# Patient Record
Sex: Female | Born: 1975 | Hispanic: No | Marital: Single | Smoking: Never smoker
Health system: Southern US, Community
[De-identification: ages and names within clinical notes are randomized; demographics above are authoritative.]

## PROBLEM LIST (undated history)

## (undated) ENCOUNTER — Emergency Department (HOSPITAL_COMMUNITY): Payer: Self-pay | Source: Home / Self Care

## (undated) DIAGNOSIS — R251 Tremor, unspecified: Secondary | ICD-10-CM

## (undated) DIAGNOSIS — F411 Generalized anxiety disorder: Secondary | ICD-10-CM

## (undated) HISTORY — PX: HIP SURGERY: SHX245

## (undated) HISTORY — PX: ABDOMINAL HYSTERECTOMY: SHX81

## (undated) HISTORY — PX: OTHER SURGICAL HISTORY: SHX169

---

## 2010-02-05 ENCOUNTER — Encounter: Admission: RE | Admit: 2010-02-05 | Discharge: 2010-02-27 | Payer: Self-pay | Admitting: Orthopedic Surgery

## 2018-11-17 ENCOUNTER — Ambulatory Visit (INDEPENDENT_AMBULATORY_CARE_PROVIDER_SITE_OTHER): Payer: No Typology Code available for payment source

## 2018-11-17 ENCOUNTER — Other Ambulatory Visit: Payer: Self-pay | Admitting: Podiatry

## 2018-11-17 ENCOUNTER — Ambulatory Visit (INDEPENDENT_AMBULATORY_CARE_PROVIDER_SITE_OTHER): Payer: No Typology Code available for payment source | Admitting: Podiatry

## 2018-11-17 ENCOUNTER — Encounter (INDEPENDENT_AMBULATORY_CARE_PROVIDER_SITE_OTHER): Payer: Self-pay

## 2018-11-17 ENCOUNTER — Encounter: Payer: Self-pay | Admitting: Podiatry

## 2018-11-17 ENCOUNTER — Other Ambulatory Visit: Payer: Self-pay

## 2018-11-17 VITALS — Temp 98.0°F

## 2018-11-17 DIAGNOSIS — M722 Plantar fascial fibromatosis: Secondary | ICD-10-CM

## 2018-11-17 DIAGNOSIS — Q666 Other congenital valgus deformities of feet: Secondary | ICD-10-CM | POA: Diagnosis not present

## 2018-11-17 DIAGNOSIS — M2141 Flat foot [pes planus] (acquired), right foot: Secondary | ICD-10-CM

## 2018-11-17 DIAGNOSIS — M2142 Flat foot [pes planus] (acquired), left foot: Secondary | ICD-10-CM

## 2018-11-17 DIAGNOSIS — M79672 Pain in left foot: Secondary | ICD-10-CM

## 2018-11-17 DIAGNOSIS — M79671 Pain in right foot: Secondary | ICD-10-CM

## 2018-11-17 NOTE — Progress Notes (Signed)
  Subjective:  Patient ID: Susan Guerra, female    DOB: 1976-05-14,  MRN: [id number]  Chief Complaint  Patient presents with  . Foot Pain    Patient presents today for bilat heel pain    43 y.o. female presents with the above complaint. Present for months, cnstant heel pain, burning, tingling, aching. Endorses flat feet.  Has tried inserts heel left pain meds without relief   Review of Systems: Negative except as noted in the HPI. Denies N/V/F/Ch.  No past medical history on file. No current outpatient medications on file.  Social History   Tobacco Use  Smoking Status Not on file    Allergies not on file Objective:  There were no vitals filed for this visit. There is no height or weight on file to calculate BMI. Constitutional Well developed. Well nourished.  Vascular Dorsalis pedis pulses palpable bilaterally. Posterior tibial pulses palpable bilaterally. Capillary refill normal to all digits.  No cyanosis or clubbing noted. Pedal hair growth normal.  Neurologic Normal speech. Oriented to person, place, and time. Epicritic sensation to light touch grossly present bilaterally.  Dermatologic Nails well groomed and normal in appearance. No open wounds. No skin lesions.  Orthopedic: Normal joint ROM without pain or crepitus bilaterally. No visible deformities. Tender to palpation at the calcaneal tuber bilaterally. No pain with calcaneal squeeze bilaterally. Ankle ROM diminished range of motion bilaterally. Silfverskiold Test: negative bilaterally. Severe pes planus hindfoot valgus bilateral   Radiographs: Taken and reviewed. No acute fractures or dislocations. No evidence of stress fracture.  Plantar heel spur absent. Posterior heel spur absent.  Severe pes planus bilateral decreased calcaneal inclination, increased calcaneal declination  Assessment:   1. Plantar fasciitis   2. Pes planus of both feet   3. Congenital hindfoot valgus   4. Heel pain, bilateral     Plan:  Patient was evaluated and treated and all questions answered.  Plantar Fasciitis, bilaterally - XR reviewed as above.  - Injection delivered to the plantar fascia as below. - DME: Rx for [facility] CMOs, night splint.  Procedure: Injection Tendon/Ligament Location: Bilateral plantar fascia at the glabrous junction; medial approach. Skin Prep: alcohol Injectate: 1 cc 0.5% marcaine plain, 1 cc dexamethasone phosphate, 0.5 cc kenalog 10. Disposition: Patient tolerated procedure well. Injection site dressed with a band-aid.  Return in about 3 weeks (around 12/08/2018) for Plantar fasciitis, Bilateral.

## 2018-11-17 NOTE — Addendum Note (Signed)
Addended by: Geraldine Contras D on: 11/17/2018 05:07 PM   Modules accepted: Orders

## 2018-12-13 ENCOUNTER — Ambulatory Visit: Payer: Non-veteran care | Admitting: Podiatry

## 2018-12-17 ENCOUNTER — Telehealth: Payer: Self-pay | Admitting: Podiatry

## 2018-12-21 NOTE — Telephone Encounter (Signed)
Pt left a message to reschedule appointment from 27 April.

## 2019-01-24 ENCOUNTER — Ambulatory Visit (INDEPENDENT_AMBULATORY_CARE_PROVIDER_SITE_OTHER): Payer: No Typology Code available for payment source | Admitting: Podiatry

## 2019-01-24 ENCOUNTER — Encounter: Payer: Self-pay | Admitting: Podiatry

## 2019-01-24 ENCOUNTER — Other Ambulatory Visit: Payer: Self-pay

## 2019-01-24 VITALS — Temp 97.2°F

## 2019-01-24 DIAGNOSIS — M722 Plantar fascial fibromatosis: Secondary | ICD-10-CM

## 2019-01-24 NOTE — Progress Notes (Signed)
She presents today for follow-up of her bilateral plantar fasciitis states that she is still hurting she may be about 40% improved.  She states that [facility] is doing is working on orthotics for her.  Objective: She has pain on palpation medial calcaneal tubercles.  Pulses remain palpable.  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  Plan: Injected the bilateral heels today 20 mg Kenalog 5 mg Marcaine for maximal tenderness.  Tolerated procedure well follow-up with her once her orthotics come in.

## 2019-06-30 ENCOUNTER — Ambulatory Visit (INDEPENDENT_AMBULATORY_CARE_PROVIDER_SITE_OTHER): Payer: Non-veteran care

## 2019-06-30 ENCOUNTER — Other Ambulatory Visit: Payer: Self-pay

## 2019-06-30 ENCOUNTER — Ambulatory Visit
Admission: EM | Admit: 2019-06-30 | Discharge: 2019-06-30 | Disposition: A | Discharge status: Home / Self Care | Payer: Non-veteran care | Attending: Family Medicine | Admitting: Family Medicine

## 2019-06-30 DIAGNOSIS — Z20828 Contact with and (suspected) exposure to other viral communicable diseases: Secondary | ICD-10-CM

## 2019-06-30 DIAGNOSIS — J069 Acute upper respiratory infection, unspecified: Secondary | ICD-10-CM

## 2019-06-30 DIAGNOSIS — Z7189 Other specified counseling: Secondary | ICD-10-CM

## 2019-06-30 DIAGNOSIS — R109 Unspecified abdominal pain: Secondary | ICD-10-CM

## 2019-06-30 DIAGNOSIS — Z20822 Contact with and (suspected) exposure to covid-19: Secondary | ICD-10-CM

## 2019-06-30 DIAGNOSIS — R05 Cough: Secondary | ICD-10-CM

## 2019-06-30 DIAGNOSIS — M545 Low back pain: Secondary | ICD-10-CM | POA: Diagnosis not present

## 2019-06-30 DIAGNOSIS — K5900 Constipation, unspecified: Secondary | ICD-10-CM

## 2019-06-30 HISTORY — DX: Tremor, unspecified: R25.1

## 2019-06-30 LAB — URINALYSIS, COMPLETE (UACMP) WITH MICROSCOPIC
Glucose, UA: NEGATIVE mg/dL
Hgb urine dipstick: NEGATIVE
Leukocytes,Ua: NEGATIVE
Nitrite: NEGATIVE
Specific Gravity, Urine: >1.03 — ABNORMAL HIGH (ref 1.005–1.030)
pH: 6.5 (ref 5.0–8.0)

## 2019-06-30 MED ORDER — HYDROCOD POLST-CPM POLST ER 10-8 MG/5ML PO SUER: 5.0000 mL | Freq: Two times a day (BID) | ORAL | 0 refills | Status: DC | PRN

## 2019-06-30 MED ORDER — POLYETHYLENE GLYCOL 3350 17 GM/SCOOP PO POWD: ORAL | 0 refills | Status: AC

## 2019-06-30 NOTE — ED Triage Notes (Signed)
Pt states she has a cough and chills x 3 weeks. Pt states she has pain in her stomach and back as well.

## 2019-06-30 NOTE — Discharge Instructions (Addendum)
It was very nice seeing you today in clinic. Thank you for entrusting me with your care.   Rest and Increase fluid intake as much as possible. Water is always best, as sugar and caffeine containing fluids can cause you to become dehydrated. Try to incorporate electrolyte enriched fluids, such as Gatorade or Pedialyte, into your daily fluid intake. I have sent in some cough medication for use as needed. Remember the cough medication will make you sleepy. May use Tylenol and/or Ibuprofen as needed for pain/fever.   Use Miralax as needed for constipation. Increasing fluid and activity will also help.  You were tested for SARS-CoV-2 (novel coronavirus) today. Testing is performed by an outside lab ([facility]) and has variable turn around times ranging between 2-5 days. Current recommendations from the the CDC and [place] DHHS require that you remain out of work in order to quarantine at home until negative test results are have been received. In the event that your test results are positive, you will be contacted with further directives. These measures are being implemented out of an abundance of caution to prevent transmission and spread during the current SARS-CoV-2 pandemic.  Make arrangements to follow up with your regular doctor in 1 week for re-evaluation if not improving.  If your symptoms/condition worsens, please seek follow up care either here or in the ER. Please remember, our [place] providers are "right here with you" when you need [place].   Again, it was my pleasure to take care of you today. Thank you for choosing our clinic. I hope that you start to feel better quickly.   Honor Loh, MSN, APRN, FNP-C, CEN Advanced Practice Provider [facility]

## 2019-06-30 NOTE — ED Provider Notes (Signed)
[place], [place]   Name: Susan Guerra DOB: Dec 09, 1975 MRN: [id number] CSN: [id number] PCP: Merry Proud, PA-C  Arrival date and time:  06/30/19 5621  Chief Complaint:  Cough and Abdominal Pain   NOTE: Prior to seeing the patient today, I have reviewed the triage nursing documentation and vital signs. Clinical staff has updated patient's PMH/PSHx, current medication list, and drug allergies/intolerances to ensure comprehensive history available to assist in medical decision making.   History:   HPI: Susan Guerra is a 43 y.o. female who presents today with complaints of cough for the last 3 weeks. She notes that cough has been dry. She denies any associated fevers. Over the course of the last week or so, patient has developed chills and body aches. Patient has had diffuse abdominal and lower back for the past few days. She denies that she has experienced any nausea, vomiting, or diarrhea. She is unable to advise when her LNBM was. She denies experiencing any urinary symptoms; no dysuria; frequency, or urgency. She is eating and drinking well. Patient denies any perceived alterations to her sense of taste or smell. 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 She has been tested the the virus in the past, however she is unable to advise when she was last tested.   Past Medical History:  Diagnosis Date  . Tremors of nervous system     Past Surgical History:  Procedure Laterality Date  . ABDOMINAL HYSTERECTOMY    . HIP SURGERY    . shoulder  suregery      Family History  Problem Relation Age of Onset  . Diabetes Mother   . Heart failure Mother   . Hypertension Mother   . Cancer Father   . Heart failure Father   . Alzheimer's disease Father     Social History   Tobacco Use  . Smoking status: Never Smoker  . Smokeless tobacco: Never Used  Substance Use Topics  . Alcohol use: Not Currently   . Drug use: Never    There are no active problems to display for this patient.   Home Medications:    No outpatient medications have been marked as taking for the 06/30/19 encounter [facility] Encounter).    Allergies:   Sulfa antibiotics and Latex  Review of Systems (ROS): Review of Systems  Constitutional: Positive for fatigue. Negative for fever.  HENT: Negative for congestion, ear pain, postnasal drip, rhinorrhea, sinus pressure, sinus pain, sneezing and sore throat.   Eyes: Negative for pain, discharge and redness.  Respiratory: Positive for cough. Negative for chest tightness and shortness of breath.   Cardiovascular: Positive for chest pain (pleuritic). Negative for palpitations.  Gastrointestinal: Positive for abdominal pain and constipation. Negative for diarrhea, nausea and vomiting.  Musculoskeletal: Positive for back pain and myalgias. Negative for arthralgias and neck pain.  Skin: Negative for color change, pallor and rash.  Neurological: Negative for dizziness, syncope, weakness and headaches.  Hematological: Negative for adenopathy.     Vital Signs: Today's Vitals   06/30/19 0920 06/30/19 0933 06/30/19 1048 06/30/19 1049  BP:  121/83    Pulse:  79    Resp:  18    Temp:  98.3 F (36.8 C)    TempSrc:  Oral    SpO2:  100%    Weight: 145 lb (65.8 kg)     PainSc: 7   7  7      Physical Exam:  Physical Exam  Constitutional: She is oriented to person, place, and time and well-developed, well-nourished, and in no distress.  Acutely ill appearing; fatigued/listless.  HENT:  Head: Normocephalic and atraumatic.  Right Ear: Tympanic membrane normal.  Left Ear: Tympanic membrane normal.  Nose: Nose normal.  Mouth/Throat: Uvula is midline, oropharynx is clear and moist and mucous membranes are normal.  Eyes: Pupils are equal, round, and reactive to light. Conjunctivae and EOM are normal.  Neck: Normal range of motion. Neck supple.  Cardiovascular: Normal rate,  regular rhythm, normal heart sounds and intact distal pulses. Exam reveals no gallop and no friction rub.  No murmur heard. Pulmonary/Chest: Effort normal. No respiratory distress. She has no decreased breath sounds. She has no wheezes. She has rhonchi (scattered; clears with cough). She has no rales.  Deep dry cough in clinic; significant.   Abdominal: Soft. Normal appearance and bowel sounds are normal. She exhibits no distension. There is no hepatosplenomegaly. There is generalized abdominal tenderness. There is no CVA tenderness.  Musculoskeletal: Normal range of motion.  Neurological: She is alert and oriented to person, place, and time. Gait normal.  Skin: Skin is warm and dry. No rash noted.  Psychiatric: Mood, memory, affect and judgment normal.  Nursing note and vitals reviewed.   Urgent Care Treatments / Results:   LABS: PLEASE NOTE: all labs that were ordered this encounter are listed, however only abnormal results are displayed. Labs Reviewed  URINALYSIS, COMPLETE (UACMP) WITH MICROSCOPIC - Abnormal; Notable for the following components:      Result Value   Color, Urine AMBER (*)    APPearance HAZY (*)    Specific Gravity, Urine >1.030 (*)    Bilirubin Urine SMALL (*)    Ketones, ur TRACE (*)    Protein, ur TRACE (*)    Bacteria, UA FEW (*)    All other components within normal limits  NOVEL CORONAVIRUS, NAA (HOSP ORDER, SEND-OUT TO REF LAB; TAT 18-24 HRS)    EKG: -None  RADIOLOGY: Dg Chest 2 View  Result Date: 06/30/2019 CLINICAL DATA:  Cough. EXAM: CHEST - 2 VIEW COMPARISON:  None. FINDINGS: The heart size and mediastinal contours are within normal limits. Both lungs are clear. No pneumothorax or pleural effusion is noted. The visualized skeletal structures are unremarkable. IMPRESSION: No active cardiopulmonary disease. Electronically Signed   By: Lupita RaiderJames  Green Jr M.D.   On: 06/30/2019 10:18   Dg Abdomen 1 View  Result Date: 06/30/2019 CLINICAL DATA:   Generalized abdominal pain. EXAM: ABDOMEN - 1 VIEW COMPARISON:  None. FINDINGS: No abnormal bowel dilatation is noted. Large amount of stool seen throughout the colon. No nephrolithiasis is noted. Phleboliths are noted in the pelvis. Intrauterine device is noted. IMPRESSION: No abnormal bowel dilatation is noted.  Large stool burden is noted. Electronically Signed   By: Lupita RaiderJames  Green Jr M.D.   On: 06/30/2019 10:19    PROCEDURES: Procedures  MEDICATIONS RECEIVED THIS VISIT: Medications - No data to display  PERTINENT CLINICAL COURSE NOTES/UPDATES:   Initial Impression / Assessment and Plan / Urgent Care Course:  Pertinent labs & imaging results that were available during my care of the patient were personally reviewed by me and considered in my medical decision making (see lab/imaging section of note for values and interpretations).  Susan Guerra is a 43 y.o. female who presents to [facility] today with complaints of Cough and Abdominal Pain   Patient overall well appearing and in no acute distress today in clinic. Presenting  symptoms (see HPI) and exam as documented above. She presents with symptoms associated with SARS-CoV-2 (novel coronavirus). Discussed typical symptom constellation. Reviewed potential for infection and need for testing. Patient amenable to being tested. SARS-CoV-2 swab collected by certified clinical staff. Discussed variable turn around times associated with testing, as swabs are being processed at [facility], and have been taking between 2-5 days to come back. She was advised to self quarantine, per [facility] DHHS guidelines, until negative results received.   UA negative for infection. Radiographs of the chest revealed no acute cardiopulmonary process; no evidence of peribronchial thickening, areas of consolidation, or focal infiltrates. Symptoms consistent with acute viral illness with cough. Until ruled out with confirmatory lab testing, SARS-CoV-2 remains part of the  differential. Her testing is pending at this time. I discussed with her that her symptoms are felt to be viral in nature, thus antibiotics would not offer her any relief or improve his symptoms any faster than conservative symptomatic management. Discussed supportive care measures at home during acute phase of illness. Cough is significant and preventing her from sleeping. Patient has used OTC ant-tussives without relief. She is having pleuritic chest pain secondary to her cough. Will send in a short term supply of Tussionex for PRN use. She was encouraged to ensure adequate hydration (water and ORS) to prevent dehydration and electrolyte derangements. Patient may use APAP and/or IBU on an as needed basis for pain/fever.   Regarding her abdominal pain. Diagnostic radiographs of the abdomen revealed large colonic stool burden consistent with constipation. Again, she was encouraged to increase fluid intake, especially while using the Tussionex. She was encouraged to move around as much as possible to help stimulate bowel action. Discussed increasing dietary fiber intake to help promote regular bowel movements.   Current clinical condition warrants patient being out of work in order to quarantine while waiting for testing results. She was provided with the appropriate documentation to provide to her place of employment that will allow for her to RTW on 07/03/2019 with no restrictions. RTW is contingent on her SARS-CoV-2 test results being reviewed as negative.   Discussed follow up with primary care physician in 1 week for re-evaluation. I have reviewed the follow up and strict return precautions for any new or worsening symptoms. Patient is aware of symptoms that would be deemed urgent/emergent, and would thus require further evaluation either here or in the emergency department. At the time of discharge, she verbalized understanding and consent with the discharge plan as it was reviewed with her. All questions  were fielded by provider and/or clinic staff prior to patient discharge.    Final Clinical Impressions / Urgent Care Diagnoses:   Final diagnoses:  Viral URI with cough  Constipation, unspecified constipation type  Encounter for laboratory testing for COVID-19 virus  Advice given about COVID-19 virus infection    New Prescriptions:  [place] Controlled Substance Registry consulted? Yes, I have consulted the [place] Controlled Substances Registry for this patient, and feel the risk/benefit ratio today is favorable for proceeding with this prescription for a controlled substance.  . Discussed use of controlled substance medication to treat her acute symptoms.  o Reviewed [place] STOP Act regulations  o Clinic does not refill controlled substances over the phone without face to face evaluation.  . Safety precautions reviewed.  o Medications should not be sold or taken with alcohol.  o Avoid use while working, driving, or operating heavy machinery.  o Side effects associated with the use of this particular  medication reviewed. - Patient understands that this medication can cause CNS depression, increase her risk of falls, and even lead to overdose that may result in death, if used outside of the parameters that she and I discussed.  With all of this in mind, she knowingly accepts the risks and responsibilities associated with intended course of treatment, and elects to responsibly proceed as discussed.  Meds ordered this encounter  Medications  . polyethylene glycol powder (MIRALAX) 17 GM/SCOOP powder    Sig: 1 scoop (17 grams) daily as needed for constipation.    Dispense:  116 g    Refill:  0  . chlorpheniramine-HYDROcodone (TUSSIONEX PENNKINETIC ER) 10-8 MG/5ML SUER    Sig: Take 5 mLs by mouth every 12 (twelve) hours as needed for cough.    Dispense:  70 mL    Refill:  0    Recommended Follow up Care:  Patient encouraged to follow up with the following provider within the specified time frame, or  sooner as dictated by the severity of her symptoms. As always, she was instructed that for any urgent/emergent care needs, she should seek care either here or in the emergency department for more immediate evaluation.  Follow-up Information    Charolett Bumpers, PA-C In 1 week.   Specialty: Physician Assistant Why: General reassessment of symptoms if not improving Contact information: [address] [place] [address] [phone]         NOTE: This note was prepared using Dragon dictation software along with smaller phrase technology. Despite my best ability to proofread, there is the potential that transcriptional errors may still occur from this process, and are completely unintentional.    Verlee Monte, NP 07/01/19 2048

## 2019-07-02 LAB — NOVEL CORONAVIRUS, NAA (HOSP ORDER, SEND-OUT TO REF LAB; TAT 18-24 HRS): SARS-CoV-2, NAA: NOT DETECTED

## 2020-03-03 ENCOUNTER — Emergency Department: Payer: No Typology Code available for payment source

## 2020-03-03 ENCOUNTER — Observation Stay
Admission: EM | Admit: 2020-03-03 | Discharge: 2020-03-04 | Disposition: A | Discharge status: Home / Self Care | Payer: No Typology Code available for payment source | Attending: Internal Medicine | Admitting: Internal Medicine

## 2020-03-03 ENCOUNTER — Other Ambulatory Visit: Payer: Self-pay

## 2020-03-03 DIAGNOSIS — G43009 Migraine without aura, not intractable, without status migrainosus: Secondary | ICD-10-CM | POA: Diagnosis present

## 2020-03-03 DIAGNOSIS — G43909 Migraine, unspecified, not intractable, without status migrainosus: Secondary | ICD-10-CM | POA: Diagnosis not present

## 2020-03-03 DIAGNOSIS — R531 Weakness: Secondary | ICD-10-CM | POA: Diagnosis not present

## 2020-03-03 DIAGNOSIS — Z20822 Contact with and (suspected) exposure to covid-19: Secondary | ICD-10-CM | POA: Insufficient documentation

## 2020-03-03 DIAGNOSIS — J45909 Unspecified asthma, uncomplicated: Secondary | ICD-10-CM | POA: Diagnosis not present

## 2020-03-03 DIAGNOSIS — F419 Anxiety disorder, unspecified: Secondary | ICD-10-CM | POA: Diagnosis not present

## 2020-03-03 DIAGNOSIS — R2990 Unspecified symptoms and signs involving the nervous system: Secondary | ICD-10-CM

## 2020-03-03 DIAGNOSIS — F329 Major depressive disorder, single episode, unspecified: Secondary | ICD-10-CM | POA: Diagnosis not present

## 2020-03-03 DIAGNOSIS — R569 Unspecified convulsions: Secondary | ICD-10-CM

## 2020-03-03 DIAGNOSIS — R410 Disorientation, unspecified: Secondary | ICD-10-CM

## 2020-03-03 LAB — COMPREHENSIVE METABOLIC PANEL
ALT: 17 U/L (ref 0–44)
AST: 17 U/L (ref 15–41)
Albumin: 3.8 g/dL (ref 3.5–5.0)
Alkaline Phosphatase: 31 U/L — ABNORMAL LOW (ref 38–126)
Anion gap: 9 (ref 5–15)
BUN: 9 mg/dL (ref 6–20)
CO2: 25 mmol/L (ref 22–32)
Calcium: 8.3 mg/dL — ABNORMAL LOW (ref 8.9–10.3)
Chloride: 107 mmol/L (ref 98–111)
Creatinine, Ser: 0.84 mg/dL (ref 0.44–1.00)
GFR calc Af Amer: >60 mL/min (ref >60)
GFR calc non Af Amer: >60 mL/min (ref >60)
Glucose, Bld: 100 mg/dL — ABNORMAL HIGH (ref 70–99)
Potassium: 3.1 mmol/L — ABNORMAL LOW (ref 3.5–5.1)
Sodium: 141 mmol/L (ref 135–145)
Total Bilirubin: 0.4 mg/dL (ref 0.3–1.2)
Total Protein: 6.7 g/dL (ref 6.5–8.1)

## 2020-03-03 LAB — CBC WITH DIFFERENTIAL/PLATELET
Abs Immature Granulocytes: 0.01 10*3/uL (ref 0.00–0.07)
Basophils Absolute: 0.1 10*3/uL (ref 0.0–0.1)
Basophils Relative: 1 %
Eosinophils Absolute: 0.1 10*3/uL (ref 0.0–0.5)
Eosinophils Relative: 1 %
HCT: 33.7 % — ABNORMAL LOW (ref 36.0–46.0)
Hemoglobin: 10.7 g/dL — ABNORMAL LOW (ref 12.0–15.0)
Immature Granulocytes: 0 %
Lymphocytes Relative: 31 %
Lymphs Abs: 1.8 10*3/uL (ref 0.7–4.0)
MCH: 27.1 pg (ref 26.0–34.0)
MCHC: 31.8 g/dL (ref 30.0–36.0)
MCV: 85.3 fL (ref 80.0–100.0)
Monocytes Absolute: 0.5 10*3/uL (ref 0.1–1.0)
Monocytes Relative: 9 %
Neutro Abs: 3.5 10*3/uL (ref 1.7–7.7)
Neutrophils Relative %: 58 %
Platelets: 321 10*3/uL (ref 150–400)
RBC: 3.95 MIL/uL (ref 3.87–5.11)
RDW: 14.8 % (ref 11.5–15.5)
WBC: 6 10*3/uL (ref 4.0–10.5)
nRBC: 0 % (ref 0.0–0.2)

## 2020-03-03 LAB — PREGNANCY, URINE: Preg Test, Ur: NEGATIVE

## 2020-03-03 LAB — PROTIME-INR
INR: 1 (ref 0.8–1.2)
Prothrombin Time: 12.6 seconds (ref 11.4–15.2)

## 2020-03-03 MED ORDER — IOHEXOL 350 MG/ML SOLN
75.0000 mL | Freq: Once | INTRAVENOUS | Status: AC | PRN
  Administered 2020-03-03: 75 mL via INTRAVENOUS
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
## 2020-03-03 NOTE — ED Notes (Signed)
Pt with left arm weakness, cannot squeeze hand, can only hold left arm up for a short time. Left leg slightly weaker than right leg, can only hold left leg up for short amount of time.
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
## 2020-03-03 NOTE — ED Notes (Signed)
Seizure pads placed x2, bed in low and locked position, siderails up.

## 2020-03-03 NOTE — ED Notes (Signed)
Neurologist speaking with pt via teleconsult.

## 2020-03-03 NOTE — ED Notes (Signed)
Patient transported to CT 

## 2020-03-03 NOTE — ED Notes (Signed)
Pt helped on bedpan, pt able to help this RN pull down pants with both hands equally and put out left arm for blood draw with no assistance. Pt gives slight squeeze when asked to squeeze with left hand

## 2020-03-03 NOTE — ED Notes (Signed)
Neuro Stat consult put in. Cart 1

## 2020-03-04 ENCOUNTER — Observation Stay (HOSPITAL_BASED_OUTPATIENT_CLINIC_OR_DEPARTMENT_OTHER)
Admit: 2020-03-04 | Discharge: 2020-03-04 | Disposition: A | Discharge status: Home / Self Care | Payer: No Typology Code available for payment source | Attending: Family Medicine | Admitting: Family Medicine

## 2020-03-04 ENCOUNTER — Observation Stay: Payer: No Typology Code available for payment source

## 2020-03-04 ENCOUNTER — Other Ambulatory Visit: Payer: Self-pay

## 2020-03-04 ENCOUNTER — Encounter: Payer: Self-pay | Admitting: Family Medicine

## 2020-03-04 DIAGNOSIS — G43009 Migraine without aura, not intractable, without status migrainosus: Secondary | ICD-10-CM | POA: Diagnosis present

## 2020-03-04 DIAGNOSIS — R569 Unspecified convulsions: Secondary | ICD-10-CM

## 2020-03-04 DIAGNOSIS — I361 Nonrheumatic tricuspid (valve) insufficiency: Secondary | ICD-10-CM | POA: Diagnosis not present

## 2020-03-04 DIAGNOSIS — R410 Disorientation, unspecified: Secondary | ICD-10-CM | POA: Diagnosis not present

## 2020-03-04 DIAGNOSIS — F419 Anxiety disorder, unspecified: Secondary | ICD-10-CM

## 2020-03-04 DIAGNOSIS — R2990 Unspecified symptoms and signs involving the nervous system: Secondary | ICD-10-CM

## 2020-03-04 DIAGNOSIS — R531 Weakness: Secondary | ICD-10-CM | POA: Diagnosis not present

## 2020-03-04 LAB — URINE DRUG SCREEN, QUALITATIVE (ARMC ONLY)
Amphetamines, Ur Screen: NOT DETECTED
Barbiturates, Ur Screen: NOT DETECTED
Benzodiazepine, Ur Scrn: POSITIVE — AB
Cannabinoid 50 Ng, Ur ~~LOC~~: NOT DETECTED
Cocaine Metabolite,Ur ~~LOC~~: NOT DETECTED
MDMA (Ecstasy)Ur Screen: NOT DETECTED
Methadone Scn, Ur: NOT DETECTED
Opiate, Ur Screen: NOT DETECTED
Phencyclidine (PCP) Ur S: NOT DETECTED
Tricyclic, Ur Screen: NOT DETECTED

## 2020-03-04 LAB — ECHOCARDIOGRAM COMPLETE
AR max vel: 2.28 cm2
AV Area VTI: 2.26 cm2
AV Area mean vel: 2.49 cm2
AV Mean grad: 3.5 mmHg
AV Peak grad: 5.9 mmHg
Ao pk vel: 1.22 m/s
Area-P 1/2: 2.66 cm2
Height: 65 in
S' Lateral: 2.72 cm
Weight: 2240 oz

## 2020-03-04 LAB — HEMOGLOBIN A1C
Hgb A1c MFr Bld: 5.2 % (ref 4.8–5.6)
Mean Plasma Glucose: 102.54 mg/dL

## 2020-03-04 LAB — HIV ANTIBODY (ROUTINE TESTING W REFLEX): HIV Screen 4th Generation wRfx: NONREACTIVE

## 2020-03-04 LAB — AMMONIA: Ammonia: 34 umol/L (ref 9–35)

## 2020-03-04 LAB — LIPID PANEL
Cholesterol: 226 mg/dL — ABNORMAL HIGH (ref 0–200)
HDL: 52 mg/dL (ref >40)
LDL Cholesterol: 169 mg/dL — ABNORMAL HIGH (ref 0–99)
Total CHOL/HDL Ratio: 4.3 RATIO
Triglycerides: 26 mg/dL (ref <150)
VLDL: 5 mg/dL (ref 0–40)

## 2020-03-04 LAB — SARS CORONAVIRUS 2 BY RT PCR (HOSPITAL ORDER, PERFORMED IN ~~LOC~~ HOSPITAL LAB): SARS Coronavirus 2: NEGATIVE

## 2020-03-04 LAB — ETHANOL: Alcohol, Ethyl (B): <10 mg/dL (ref <10)

## 2020-03-04 LAB — TSH: TSH: 1.332 u[IU]/mL (ref 0.350–4.500)

## 2020-03-04 LAB — VITAMIN B12: Vitamin B-12: 328 pg/mL (ref 180–914)

## 2020-03-04 MED ORDER — FERROUS SULFATE 325 (65 FE) MG PO TABS
325.0000 mg | ORAL_TABLET | Freq: Every day | ORAL | Status: DC
  Administered 2020-03-04: 325 mg via ORAL
  Filled 2020-03-04 (×2): qty 1

## 2020-03-04 MED ORDER — SODIUM CHLORIDE 0.9 % IV SOLN: INTRAVENOUS | Status: DC

## 2020-03-04 MED ORDER — STROKE: EARLY STAGES OF RECOVERY BOOK: Freq: Once | Status: DC

## 2020-03-04 MED ORDER — ASPIRIN EC 325 MG PO TBEC: 325.0000 mg | DELAYED_RELEASE_TABLET | Freq: Every day | ORAL | Status: DC

## 2020-03-04 MED ORDER — ENOXAPARIN SODIUM 40 MG/0.4ML ~~LOC~~ SOLN: 40.0000 mg | SUBCUTANEOUS | Status: DC

## 2020-03-04 MED ORDER — ASPIRIN 300 MG RE SUPP: 300.0000 mg | Freq: Every day | RECTAL | Status: DC

## 2020-03-04 MED ORDER — DULOXETINE HCL 30 MG PO CPEP
90.0000 mg | ORAL_CAPSULE | Freq: Every day | ORAL | Status: DC
  Filled 2020-03-04: qty 1

## 2020-03-04 MED ORDER — PNEUMOCOCCAL VAC POLYVALENT 25 MCG/0.5ML IJ INJ: 0.5000 mL | INJECTION | INTRAMUSCULAR | Status: DC

## 2020-03-04 MED ORDER — ACETAMINOPHEN 650 MG RE SUPP: 650.0000 mg | RECTAL | Status: DC | PRN

## 2020-03-04 MED ORDER — ACETAMINOPHEN 325 MG PO TABS: 650.0000 mg | ORAL_TABLET | ORAL | Status: DC | PRN

## 2020-03-04 MED ORDER — SENNOSIDES-DOCUSATE SODIUM 8.6-50 MG PO TABS: 1.0000 | ORAL_TABLET | Freq: Every evening | ORAL | Status: DC | PRN

## 2020-03-04 MED ORDER — ACETAMINOPHEN 160 MG/5ML PO SOLN
650.0000 mg | ORAL | Status: DC | PRN
  Filled 2020-03-04: qty 20.3

## 2020-03-04 MED ORDER — DROPERIDOL 2.5 MG/ML IJ SOLN
2.5000 mg | Freq: Once | INTRAMUSCULAR | Status: AC
  Administered 2020-03-04: 2.5 mg via INTRAVENOUS
  Filled 2020-03-04: qty 2

## 2020-03-04 MED ORDER — SODIUM CHLORIDE 0.9 % IV BOLUS
1000.0000 mL | Freq: Once | INTRAVENOUS | Status: AC
  Administered 2020-03-04: 1000 mL via INTRAVENOUS

## 2020-03-04 MED ORDER — DIPHENHYDRAMINE HCL 50 MG/ML IJ SOLN
25.0000 mg | INTRAMUSCULAR | Status: AC
  Administered 2020-03-04: 25 mg via INTRAVENOUS
  Filled 2020-03-04: qty 1

## 2020-03-04 MED ORDER — LORAZEPAM 2 MG/ML IJ SOLN: 1.0000 mg | INTRAMUSCULAR | Status: DC | PRN

## 2020-03-04 MED ORDER — DEXAMETHASONE SODIUM PHOSPHATE 10 MG/ML IJ SOLN
10.0000 mg | Freq: Once | INTRAMUSCULAR | Status: AC
  Administered 2020-03-04: 10 mg via INTRAVENOUS
  Filled 2020-03-04: qty 1

## 2020-03-04 MED ORDER — CLOPIDOGREL BISULFATE 75 MG PO TABS
75.0000 mg | ORAL_TABLET | Freq: Every day | ORAL | Status: DC
  Administered 2020-03-04: 75 mg via ORAL
  Filled 2020-03-04: qty 1

## 2020-03-04 MED ORDER — KETOROLAC TROMETHAMINE 30 MG/ML IJ SOLN
15.0000 mg | Freq: Once | INTRAMUSCULAR | Status: AC
  Administered 2020-03-04: 15 mg via INTRAVENOUS
  Filled 2020-03-04: qty 1

## 2020-03-04 MED ORDER — LEVETIRACETAM IN NACL 1000 MG/100ML IV SOLN: 1000.0000 mg | Freq: Once | INTRAVENOUS | Status: DC

## 2020-03-04 NOTE — ED Notes (Signed)
Patient's home medications sent home with her brother Ramon Dredge. Six bottles and a bag of clothing were sent home with Ramon Dredge.

## 2020-03-04 NOTE — ED Notes (Signed)
MD notified that pt passed stroke swallow screen so MD can order appropriate diet.

## 2020-03-04 NOTE — Progress Notes (Signed)
Susan Guerra to be D/C'd Home per MD order.  Discussed prescriptions and follow up appointments with the patient. No prescriptions given to patient, medication list explained in detail. Pt and sister in law verbalized understanding.  Allergies as of 03/04/2020      Reactions   Aspirin Shortness Of Breath   Sulfa Antibiotics Other (See Comments)   Other reaction(s): RASH Other reaction(s): Other (See Comments) G6PD Deficiency G6PD Deficiency   Latex Itching      Medication List    TAKE these medications   Ajovy 225 MG/1.5ML Sosy Generic drug: Fremanezumab-vfrm Inject 1.5 mLs into the skin every 30 (thirty) days.   Armodafinil 50 MG tablet Take  1 tablet in QAM for  2 days; if tolerated , take  2 tablets in AM.   Asmanex (60 Metered Doses) 220 MCG/INH inhaler Generic drug: mometasone Inhale 2 puffs into the lungs at bedtime.   cetirizine 10 MG tablet Commonly known as: ZYRTEC Take 10 mg by mouth at bedtime as needed.   chlorpheniramine-HYDROcodone 10-8 MG/5ML Suer Commonly known as: Tussionex Pennkinetic ER Take 5 mLs by mouth every 12 (twelve) hours as needed for cough.   clonazePAM 2 MG tablet Commonly known as: KLONOPIN Take 2 mg by mouth at bedtime as needed.   diclofenac 75 MG EC tablet Commonly known as: VOLTAREN Take 75 mg by mouth 2 (two) times daily.   divalproex 500 MG 24 hr tablet Commonly known as: DEPAKOTE ER   DULoxetine 30 MG capsule Commonly known as: CYMBALTA Take 90 mg by mouth daily.   ferrous sulfate 325 (65 FE) MG EC tablet Take 325 mg by mouth daily.   levonorgestrel 20 MCG/24HR IUD Commonly known as: MIRENA by Intrauterine route.   modafinil 200 MG tablet Commonly known as: PROVIGIL Take 200 mg by mouth daily.   multivitamin with minerals Tabs tablet Take 1 tablet by mouth daily.   polyethylene glycol powder 17 GM/SCOOP powder Commonly known as: MiraLax 1 scoop (17 grams) daily as needed for constipation.   pregabalin 100 MG  capsule Commonly known as: LYRICA   Proventil HFA 108 (90 Base) MCG/ACT inhaler Generic drug: albuterol every 4 (four) hours as needed.   SUMAtriptan 100 MG tablet Commonly known as: IMITREX Take 100 mg by mouth every 2 (two) hours as needed.   tiZANidine 2 MG tablet Commonly known as: ZANAFLEX Take 2 mg by mouth at bedtime.   traZODone 100 MG tablet Commonly known as: DESYREL Take 100 mg by mouth at bedtime.   VITAMIN D3 PO Take 2 tablets by mouth daily.   zolmitriptan 5 MG tablet Commonly known as: ZOMIG Take 5 mg by mouth every 2 (two) hours as needed.   zonisamide 100 MG capsule Commonly known as: ZONEGRAN Take by mouth.       Vitals:   03/04/20 1118 03/04/20 1439  BP: 115/81 125/73  Pulse: 79 74  Resp:    Temp:    SpO2:      Skin clean, dry and intact without evidence of skin break down, no evidence of skin tears noted. IV catheter discontinued intact. Site without signs and symptoms of complications. Dressing and pressure applied. Pt denies pain at this time. No complaints noted.  An After Visit Summary was printed and given to the patient. Patient escorted via WC, and D/C home via private auto.  Susan Guerra Susan Guerra
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
## 2020-03-04 NOTE — Evaluation (Signed)
Occupational Therapy Evaluation Patient Details Name: Susan Guerra MRN: [id number] DOB: December 13, 1975 Today's Date: 03/04/2020    History of Present Illness Pt is a 44 y.o. Caucasian female with a known history of migraine PE, obstructive sleep apnea and asthma. Per MD impression, pt is presenting with possible new onset seizures w/ associated acute encephalopathy, confusion, and L weakness/numbness possibly related to atypical migrane TIA/stroke. Per MRI report pt has no acute intracranial abnormality.   Clinical Impression   Susan Guerra was seen for OT evaluation this date. Prior to hospital admission, pt was Independent c mobility and I/ADLs (husband supervises shower 2/2 vertigo) including working full time and pt has 24/7 care at home. Pt presents to acute OT demonstrating baseline independence to perform ADL and mobility tasks and no strength, sensory, coordination, or cognitive deficits appreciated with assessment. Pt denies concerns c ADLs returning home however expresses anxiety around not knowing where her husband is and not remembering events leading to hospital admission - OT educated pt on how to call family using room phone and notified unit secretary of pt's desire to change designated visitor. No skilled OT needs identified. Will sign off. Please re-consult if additional OT needs arise. Pt would benefit from 3-in-1 commode for improved functional independence c bathing.     Follow Up Recommendations  No OT follow up    Equipment Recommendations  3 in 1 bedside commode    Recommendations for Other Services       Precautions / Restrictions Precautions Precautions: Fall;Other (comment) Precaution Comments: seizure precautions Restrictions Weight Bearing Restrictions: No      Mobility Bed Mobility Overal bed mobility: Independent             General bed mobility comments:   Transfers Overall transfer level: Independent   Transfers: Sit to/from Stand           General transfer comment: Not tested, per PT pt is independent    Balance Overall balance assessment: Independent                                         ADL either performed or assessed with clinical judgement   ADL Overall ADL's : Modified independent                                       General ADL Comments: Dons briefs at bed level bridging Independently. Demosntrates appropriatae FMC and BUE AROM for ADLs     Vision         Perception     Praxis      Pertinent Vitals/Pain Pain Assessment: No/denies pain     Hand Dominance     Extremity/Trunk Assessment Upper Extremity Assessment Upper Extremity Assessment: Overall WFL for tasks assessed   Lower Extremity Assessment Lower Extremity Assessment: Overall WFL for tasks assessed       Communication Communication Communication: No difficulties   Cognition Arousal/Alertness: Awake/alert Behavior During Therapy: WFL for tasks assessed/performed Overall Cognitive Status: Within Functional Limits for tasks assessed                                 General Comments: Pt expressed anxiety r/t not knowing where her husband is or knowing the circumstances that led to admission  General Comments       Exercises Exercises: Other exercises Other Exercises Other Exercises: Pt educated re: OT role, DME recs, d/c recs, home/routines modifications Other Exercises: LBD, simulated UBD, therapeutic listening    Shoulder Instructions      Home Living Family/patient expects to be discharged to:: Private residence Living Arrangements: Spouse/significant other;Children;Other relatives Available Help at Discharge: Family;Available 24 hours/day Type of Home: House Home Access: Stairs to enter [employer] of Steps: 3 Entrance Stairs-Rails: Left Home Layout: Two level;Able to live on main level with bedroom/bathroom     Bathroom Shower/Tub: Walk-in shower;Tub/shower  unit   Bathroom Toilet: Handicapped height Bathroom Accessibility: Yes   Home Equipment: Crutches          Prior Functioning/Environment Level of Independence: Independent with assistive device(s)    ADL's / Homemaking Assistance Needed: Husband supervises standing bathing secondary to pt reported vestibular vertigo            OT Problem List: Impaired sensation      OT Treatment/Interventions:      OT Goals(Current goals can be found in the care plan section) Acute Rehab OT Goals Patient Stated Goal: To return home OT Goal Formulation: With patient Time For Goal Achievement: 03/18/20 Potential to Achieve Goals: Good  OT Frequency:     Barriers to D/C:            Co-evaluation              AM-PAC OT "6 Clicks" Daily Activity     Outcome Measure Help from another person eating meals?: None Help from another person taking care of personal grooming?: None Help from another person toileting, which includes using toliet, bedpan, or urinal?: A Little Help from another person bathing (including washing, rinsing, drying)?: A Little Help from another person to put on and taking off regular upper body clothing?: None Help from another person to put on and taking off regular lower body clothing?: None 6 Click Score: 22   End of Session    Activity Tolerance: Patient tolerated treatment well Patient left: in bed;with call bell/phone within reach  OT Visit Diagnosis: Other abnormalities of gait and mobility (R26.89)                Time: 1610-9604 OT Time Calculation (min): 13 min Charges:  OT General Charges $OT Visit: 1 Visit OT Evaluation $OT Eval Low Complexity: 1 Low OT Treatments $Self Care/Home Management : 8-22 mins  Kathie Dike, M.S. OTR/L  03/04/20, 1:04 PM

## 2020-03-04 NOTE — Progress Notes (Signed)
Patient arrived to Rm 246 at 65. No report received from ER nurse.

## 2020-03-04 NOTE — Progress Notes (Signed)
*  PRELIMINARY RESULTS* Echocardiogram 2D Echocardiogram has been performed.  Cristela Blue 03/04/2020, 11:57 AM

## 2020-03-04 NOTE — ED Notes (Signed)
Warm blankets provided.

## 2020-03-04 NOTE — ED Notes (Signed)
Lab called for blood draw on pt, lab to come draw ethanol level.

## 2020-03-04 NOTE — Evaluation (Signed)
Physical Therapy Evaluation Patient Details Name: Susan Guerra MRN: [id number] DOB: 02-28-1976 Today's Date: 03/04/2020   History of Present Illness  Pt is a 44 y.o. Caucasian female with a known history of migraine PE, obstructive sleep apnea and asthma. Per MD impression, pt is presenting with possible new onset seizures w/ associated acute encephalopathy, confusion, and L weakness/numbness possibly related to atypical migrane TIA/stroke. Per MRI report pt has no acute intracranial abnormality.  Clinical Impression  Pt pleasant and motivated to participate during the session. Per pt history, pt lives at home w/ spouse, kids, and SIL that is around and able to help 24hr/d. Pt reports independence with all household activities with the exception of bathing secondary to pt reported vestibular vertigo. Pt reports husband helps with bathing "just to be safe". During today's session, CGA was provided throughout all activities secondary to seizure precautions. Pt demonstrated adequate functional mobility to independently perform bed mobility, transfers, ambulation, and stairs. Pt only expressed being limited for participation in normal activities due to R hip pain and already has a follow-up scheduled to further evaluate hip pain. Will complete PT orders at this time but will reassess patient pending a change in status upon receipt of new PT orders.     Follow Up Recommendations No PT follow up    Equipment Recommendations  None recommended by PT    Recommendations for Other Services       Precautions / Restrictions Precautions Precautions: Fall;Other (comment) Precaution Comments: seizure precautions Restrictions Weight Bearing Restrictions: No      Mobility  Bed Mobility Overal bed mobility: Independent             General bed mobility comments: pt able to get supine-sit with no physical assist or cueing  Transfers Overall transfer level: Independent   Transfers: Sit to/from  Stand Sit to Stand: Min guard         General transfer comment: Min guard provided secondary to seizure precautions but pt functionally independent with mobility  Ambulation/Gait Ambulation/Gait assistance: Min guard Gait Distance (Feet): 100 Feet Assistive device: None Gait Pattern/deviations: Antalgic Gait velocity: normal   General Gait Details: Min guard provided secondary to seizure precautions but pt functionally independent with ambulation w/o use of AD. Pt has mod antalgic gait secondary to R hip pain 5y s/p R hip arthroscopy and pt notes pain returning 2-1mo ago.  Stairs Stairs: Yes Stairs assistance: Modified independent (Device/Increase time) Stair Management: One rail Left Number of Stairs: 5 General stair comments: Min guard provided secondary to seizure precautions. Pt functionally independent with ascending and descending 5x stair with use of L rail. Pt required more time and effort to decend stairs with heavier reliance on L rail secondary to R hip pain.  Wheelchair Mobility    Modified Rankin (Stroke Patients Only)       Balance Overall balance assessment: Independent                                           Pertinent Vitals/Pain Pain Assessment: No/denies pain    Home Living Family/patient expects to be discharged to:: Private residence Living Arrangements: Spouse/significant other;Children;Other relatives Available Help at Discharge: Family;Available 24 hours/day Type of Home: House Home Access: Stairs to enter Entrance Stairs-Rails: Left Entrance Stairs-Number of Steps: 3 Home Layout: Two level;Able to live on main level with bedroom/bathroom Home Equipment: Crutches  Prior Function Level of Independence: Needs assistance      ADL's / Homemaking Assistance Needed: Husband helps with bathing secondary to pt reported vestibular vertigo        Hand Dominance        Extremity/Trunk Assessment   Upper Extremity  Assessment Upper Extremity Assessment: Overall WFL for tasks assessed    Lower Extremity Assessment Lower Extremity Assessment: Overall WFL for tasks assessed       Communication   Communication: No difficulties  Cognition Arousal/Alertness: Awake/alert Behavior During Therapy: WFL for tasks assessed/performed Overall Cognitive Status: Within Functional Limits for tasks assessed                                        General Comments      Exercises Total Joint Exercises Long Arc Quad: AROM;Strengthening;Both;20 reps   Assessment/Plan    PT Assessment Patent does not need any further PT services  PT Problem List         PT Treatment Interventions      PT Goals (Current goals can be found in the Care Plan section)  Acute Rehab PT Goals PT Goal Formulation: All assessment and education complete, DC therapy    Frequency     Barriers to discharge        Co-evaluation               AM-PAC PT "6 Clicks" Mobility  Outcome Measure Help needed turning from your back to your side while in a flat bed without using bedrails?: None Help needed moving from lying on your back to sitting on the side of a flat bed without using bedrails?: None Help needed moving to and from a bed to a chair (including a wheelchair)?: None Help needed standing up from a chair using your arms (e.g., wheelchair or bedside chair)?: None Help needed to walk in hospital room?: None Help needed climbing 3-5 steps with a railing? : None 6 Click Score: 24    End of Session Equipment Utilized During Treatment: Gait belt Activity Tolerance: Patient tolerated treatment well Patient left: in bed;with call bell/phone within reach;with bed alarm set Nurse Communication: Mobility status PT Visit Diagnosis: Muscle weakness (generalized) (M62.81)    Time: 6295-2841 PT Time Calculation (min) (ACUTE ONLY): 31 min   Charges:              Susan Guerra SPT 03/04/20, 12:10  PM
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
## 2020-03-04 NOTE — ED Notes (Signed)
Lab called to come and collect am blood samples d/t hard stick

## 2020-03-04 NOTE — ED Provider Notes (Addendum)
-----------------------------------------   12:02 AM on 03/04/2020 -----------------------------------------  Assuming care from Dr. Erma Heritage.  In short, Susan Guerra is a 44 y.o. female with a chief complaint of possible seizures.  Refer to the original H&P for additional details.  The current plan of care is to follow up CTAs and neuro recommendations.    ----------------------------------------- 12:28 AM on 03/04/2020 -----------------------------------------  I discussed the case by phone in detail with the neurologist.  He was able to get a lot of collateral information from family members including the patient's brother, Susan Guerra. The history initially provided is not consistent with actual events.  See the neurologist note for details.  But in short, there is no evidence of LVO and the patient is not a candidate for TPA based on unknown last normal, questionable history of trauma, somewhat inconsistent symptoms, and the brother agrees that TPA would not be a good idea even if the patient is not fully cognizant at this time with questionable capacity given her ongoing confusion.  The neurologist recommended admission for further CVA work-up including MRI and observation as well as migraine cocktail treatment and evaluation in person by neurology in the morning.  I consulted the hospitalist for admission and I have ordered the following medications for her migraine: Droperidol 2.5 mg IV, Benadryl 25 mg IV, Toradol 15 mg IV, 1 L normal saline, Decadron 10 mg IV.   ----------------------------------------- 12:44 AM on 03/04/2020 -----------------------------------------  Discussed case with Dr. Arville Care with the hospitalist service.    Discussed the case in detail and he will also review the neurology note.  He will admit the patient for further management.  I also updated the patient but she remains confused.  She seems understand that she will be staying in the hospital.  Of note at this time I do  not see any evidence of nystagmus.   Loleta Rose, MD 03/04/20 [phone]
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
## 2020-11-23 ENCOUNTER — Ambulatory Visit
Admission: RE | Admit: 2020-11-23 | Discharge: 2020-11-23 | Disposition: A | Discharge status: Home / Self Care | Payer: Disability Insurance | Attending: Thoracic Surgery | Admitting: Thoracic Surgery

## 2020-11-23 ENCOUNTER — Ambulatory Visit
Admission: RE | Admit: 2020-11-23 | Discharge: 2020-11-23 | Disposition: A | Discharge status: Home / Self Care | Payer: Disability Insurance | Source: Ambulatory Visit | Attending: Thoracic Surgery | Admitting: Thoracic Surgery

## 2020-11-23 ENCOUNTER — Other Ambulatory Visit: Payer: Self-pay | Admitting: Thoracic Surgery

## 2020-11-23 ENCOUNTER — Other Ambulatory Visit: Payer: Self-pay

## 2020-11-23 DIAGNOSIS — M25551 Pain in right hip: Secondary | ICD-10-CM

## 2020-11-23 DIAGNOSIS — M25512 Pain in left shoulder: Secondary | ICD-10-CM

## 2020-11-23 DIAGNOSIS — M25511 Pain in right shoulder: Secondary | ICD-10-CM | POA: Diagnosis present

## 2020-11-23 DIAGNOSIS — M25552 Pain in left hip: Secondary | ICD-10-CM | POA: Insufficient documentation

## 2020-11-26 ENCOUNTER — Ambulatory Visit (HOSPITAL_COMMUNITY): Payer: No Typology Code available for payment source | Admitting: Licensed Clinical Social Worker

## 2020-12-10 ENCOUNTER — Other Ambulatory Visit: Payer: Self-pay

## 2020-12-10 ENCOUNTER — Encounter (HOSPITAL_COMMUNITY): Payer: Self-pay | Admitting: Licensed Clinical Social Worker

## 2020-12-10 ENCOUNTER — Ambulatory Visit (INDEPENDENT_AMBULATORY_CARE_PROVIDER_SITE_OTHER): Payer: No Typology Code available for payment source | Admitting: Licensed Clinical Social Worker

## 2020-12-10 DIAGNOSIS — F331 Major depressive disorder, recurrent, moderate: Secondary | ICD-10-CM | POA: Diagnosis not present

## 2020-12-10 DIAGNOSIS — F4310 Post-traumatic stress disorder, unspecified: Secondary | ICD-10-CM | POA: Diagnosis not present

## 2020-12-10 DIAGNOSIS — F329 Major depressive disorder, single episode, unspecified: Secondary | ICD-10-CM | POA: Insufficient documentation
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
## 2020-12-18 ENCOUNTER — Ambulatory Visit (INDEPENDENT_AMBULATORY_CARE_PROVIDER_SITE_OTHER): Payer: No Typology Code available for payment source | Admitting: Licensed Clinical Social Worker

## 2020-12-18 ENCOUNTER — Other Ambulatory Visit: Payer: Self-pay

## 2020-12-18 ENCOUNTER — Encounter (HOSPITAL_COMMUNITY): Payer: Self-pay | Admitting: Licensed Clinical Social Worker

## 2020-12-18 ENCOUNTER — Encounter (HOSPITAL_COMMUNITY): Payer: Self-pay

## 2020-12-18 DIAGNOSIS — F4310 Post-traumatic stress disorder, unspecified: Secondary | ICD-10-CM | POA: Diagnosis not present

## 2020-12-18 DIAGNOSIS — F331 Major depressive disorder, recurrent, moderate: Secondary | ICD-10-CM | POA: Diagnosis not present

## 2020-12-18 NOTE — Progress Notes (Signed)
Virtual Visit via Video Note  I connected with Susan Guerra on 12/18/20 at  9:00 AM EDT by a video enabled telemedicine application and verified that I am speaking with the correct person using two identifiers.  Location: Patient: home Provider: home office   I discussed the limitations of evaluation and management by telemedicine and the availability of in person appointments. The patient expressed understanding and agreed to proceed.  History of Present Illness: Pt is referred to therapy for PTSD (MST) and MDD by the [facility]. Her biggest stressor is her 11 yo son who has explosive aspbergers and 2 younger children (14 & 5). She lives with her "husband." Pt has fibromyalgia, which affects her ability to take care of all her family's needs.  She has previous therapy.    Observations/Objective: Patient presented for today's session on time and was alert, oriented x5, with no evidence or self-report of SI/HI or A/V H.  Patient reported ongoing compliance with medication and denied any use of alcohol or illicit substances.  Clinician inquired about patient's current emotional ratings, as well as any significant changes in thoughts, feelings or behavior since previous session.  Patient reported scores of 2/10 for depression, 3/10 for anxiety, 0/10 for anger/irritability. Today was the first day of individual counseling, spent considerable amount of time building a trusting therapeutic relationship and gaining background information. Pt is on disability from the [employer] and does not work. Her "husband" works and is on partial disability for PTSD. They have 3 children who she "spoils," and feels pulled by all 3. Pt does not practice self care, plus she has fibromyalgia. Will continue with next few sessions to build a trusting, therapeutic relationship.   Assessment and Plan: Counselor will continue to meet with patient to address treatment plan goals. Patient will continue to follow  recommendations of providers and implement skills learned in session.   Follow Up Instructions: I discussed the assessment and treatment plan with the patient. The patient was provided an opportunity to ask questions and all were answered. The patient agreed with the plan and demonstrated an understanding of the instructions.   The patient was advised to call back or seek an in-person evaluation if the symptoms worsen or if the condition fails to improve as anticipated.  I provided 60 minutes of non-face-to-face time during this encounter.   Meyer Arora S, LCAS

## 2020-12-25 ENCOUNTER — Ambulatory Visit (INDEPENDENT_AMBULATORY_CARE_PROVIDER_SITE_OTHER): Payer: No Typology Code available for payment source | Admitting: Licensed Clinical Social Worker

## 2020-12-25 ENCOUNTER — Other Ambulatory Visit: Payer: Self-pay

## 2020-12-25 ENCOUNTER — Encounter (HOSPITAL_COMMUNITY): Payer: Self-pay | Admitting: Licensed Clinical Social Worker

## 2020-12-25 DIAGNOSIS — F4310 Post-traumatic stress disorder, unspecified: Secondary | ICD-10-CM | POA: Diagnosis not present

## 2020-12-25 DIAGNOSIS — F331 Major depressive disorder, recurrent, moderate: Secondary | ICD-10-CM

## 2020-12-25 NOTE — Progress Notes (Signed)
Virtual Visit via Video Note  I connected with Susan Guerra on 12/25/20 at  9:00 AM EDT by a video enabled telemedicine application and verified that I am speaking with the correct person using two identifiers.  Location: Patient: home Provider: home office   I discussed the limitations of evaluation and management by telemedicine and the availability of in person appointments. The patient expressed understanding and agreed to proceed.  History of Present Illness: Pt is referred to therapy for PTSD (MST) and MDD by the [facility]. Her biggest stressor is her 43 yo son who has explosive aspbergers and 2 younger children (14 & 5). She lives with her "husband." Pt has fibromyalgia, which affects her ability to take care of all her family's needs.  She has previous therapy.    Observations/Objective: Patient presented for today's session on time and was alert, oriented x5, with no evidence or self-report of SI/HI or A/V H.  Patient reported ongoing compliance with medication and denied any use of alcohol or illicit substances.  Clinician inquired about patient's current emotional ratings, as well as any significant changes in thoughts, feelings or behavior since previous session.  Patient reported scores of 2/10 for depression, 3/10 for anxiety, 2/10 for anger/irritability. Patient has to end the call early today as she is taking a friend to a dr appt. Pt reports concerns about her relationship with her "husband" They are not married but in a 10 year relationship which has included infidelity and $ issues. Cln assessed family dynamics .Attempted to redirect focus to things within patient's control. Cln provided psychoeducation on dynamics of healthy vs unhealthy relationships associated with power and control. Pt asked about a therapist for her husband who has PTSD and alcoholism. Cln will research clinician for pt.     Assessment and Plan: Counselor will continue to meet with patient to  address treatment plan goals. Patient will continue to follow recommendations of providers and implement skills learned in session.   Follow Up Instructions: I discussed the assessment and treatment plan with the patient. The patient was provided an opportunity to ask questions and all were answered. The patient agreed with the plan and demonstrated an understanding of the instructions.   The patient was advised to call back or seek an in-person evaluation if the symptoms worsen or if the condition fails to improve as anticipated.  I provided 40 minutes of non-face-to-face time during this encounter.   Damoni Erker S, LCAS

## 2021-01-01 ENCOUNTER — Ambulatory Visit (HOSPITAL_COMMUNITY): Payer: No Typology Code available for payment source | Admitting: Licensed Clinical Social Worker

## 2021-01-01 ENCOUNTER — Other Ambulatory Visit: Payer: Self-pay

## 2021-01-02 ENCOUNTER — Other Ambulatory Visit: Payer: Self-pay

## 2021-01-02 ENCOUNTER — Ambulatory Visit (INDEPENDENT_AMBULATORY_CARE_PROVIDER_SITE_OTHER): Payer: No Typology Code available for payment source | Admitting: Licensed Clinical Social Worker

## 2021-01-02 ENCOUNTER — Encounter (HOSPITAL_COMMUNITY): Payer: Self-pay | Admitting: Licensed Clinical Social Worker

## 2021-01-02 DIAGNOSIS — F331 Major depressive disorder, recurrent, moderate: Secondary | ICD-10-CM | POA: Diagnosis not present

## 2021-01-02 DIAGNOSIS — F4310 Post-traumatic stress disorder, unspecified: Secondary | ICD-10-CM | POA: Diagnosis not present

## 2021-01-02 NOTE — Progress Notes (Signed)
Virtual Visit via Video Note  I connected with Susan Guerra on 01/02/21 at 10:00 AM EDT by a video enabled telemedicine application and verified that I am speaking with the correct person using two identifiers.  Location: Patient: car Provider: home office   I discussed the limitations of evaluation and management by telemedicine and the availability of in person appointments. The patient expressed understanding and agreed to proceed.  History of Present Illness: Pt is referred to therapy for PTSD (MST) and MDD by the [facility]. Her biggest stressor is her 43 yo son who has explosive aspbergers and 2 younger children (14 & 5). She lives with her "husband." Pt has fibromyalgia, which affects her ability to take care of all her family's needs.  She has previous therapy.    Observations/Objective: Patient presented for today's session on time and was alert, oriented x5, with no evidence or self-report of SI/HI or A/V H. Patient reported ongoing compliance with medication and denied any use of alcohol or illicit substances.  Clinician inquired about patient's current emotional ratings, as well as any significant changes in thoughts, feelings or behavior since previous session.  Patient reported scores of 6/10 for depression, 6/10 for anxiety, 6/10 for anger/irritability. Patient reports on her current stressors: relationship, sister in law, relationship between her adult son and her "husband." Pt did a lot of complaining about her stressors and was resistant to problem solving. Cln suggested problem solving, talking with husband, using boundaries. Cln role played boundaries with patient.        Assessment and Plan: Counselor will continue to meet with patient to address treatment plan goals. Patient will continue to follow recommendations of providers and implement skills learned in session.   Follow Up Instructions: I discussed the assessment and treatment plan with the patient. The  patient was provided an opportunity to ask questions and all were answered. The patient agreed with the plan and demonstrated an understanding of the instructions.   The patient was advised to call back or seek an in-person evaluation if the symptoms worsen or if the condition fails to improve as anticipated.  I provided 45 minutes of non-face-to-face time during this encounter.   Faatimah Spielberg S, LCAS

## 2021-01-08 ENCOUNTER — Ambulatory Visit (INDEPENDENT_AMBULATORY_CARE_PROVIDER_SITE_OTHER): Payer: No Typology Code available for payment source | Admitting: Licensed Clinical Social Worker

## 2021-01-08 ENCOUNTER — Other Ambulatory Visit: Payer: Self-pay

## 2021-01-08 ENCOUNTER — Encounter (HOSPITAL_COMMUNITY): Payer: Self-pay | Admitting: Licensed Clinical Social Worker

## 2021-01-08 DIAGNOSIS — F4310 Post-traumatic stress disorder, unspecified: Secondary | ICD-10-CM

## 2021-01-08 DIAGNOSIS — F331 Major depressive disorder, recurrent, moderate: Secondary | ICD-10-CM

## 2021-01-08 NOTE — Progress Notes (Signed)
Virtual Visit via Video Note  I connected with Susan Guerra on 01/08/21 at  9:00 AM EDT by a video enabled telemedicine application and verified that I am speaking with the correct person using two identifiers.  Location: Patient: car Provider: home office   I discussed the limitations of evaluation and management by telemedicine and the availability of in person appointments. The patient expressed understanding and agreed to proceed.  History of Present Illness: Pt is referred to therapy for PTSD (MST) and MDD by the [facility]. Her biggest stressor is her 43 yo son who has explosive aspbergers and 2 younger children (14 & 5). She lives with her "husband." Pt has fibromyalgia, which affects her ability to take care of all her family's needs.  She has previous therapy.    Observations/Objective: Patient presented for today's session on time and was alert, oriented x5, with no evidence or self-report of SI/HI or A/V H. Patient reported ongoing compliance with medication and denied any use of alcohol or illicit substances.  Clinician inquired about patient's current emotional ratings, as well as any significant changes in thoughts, feelings or behavior since previous session.  Patient reported scores of 6/10 for depression, 6/10 for anxiety, 6/10 for anger/irritability. Patient reports on her current stressors:  relationship between her adult son and her "husband." Patient reports they are all going to a family reunion this weekend. Cln and pt explored and role played possible scenarios for encounters between adult son and husband. Pt reports concerns of her husband's alcoholic drinking. Cln explored therapists who specialize in substance abuse and gave pt names.      Assessment and Plan: Counselor will continue to meet with patient to address treatment plan goals. Patient will continue to follow recommendations of providers and implement skills learned in session.   Follow Up  Instructions: I discussed the assessment and treatment plan with the patient. The patient was provided an opportunity to ask questions and all were answered. The patient agreed with the plan and demonstrated an understanding of the instructions.   The patient was advised to call back or seek an in-person evaluation if the symptoms worsen or if the condition fails to improve as anticipated.  I provided 45 minutes of non-face-to-face time during this encounter.   Virginie Josten S, LCAS

## 2021-01-15 ENCOUNTER — Ambulatory Visit (HOSPITAL_COMMUNITY): Payer: No Typology Code available for payment source | Admitting: Licensed Clinical Social Worker

## 2021-01-16 ENCOUNTER — Other Ambulatory Visit: Payer: Self-pay

## 2021-01-16 ENCOUNTER — Ambulatory Visit (HOSPITAL_COMMUNITY): Payer: No Typology Code available for payment source | Admitting: Licensed Clinical Social Worker

## 2021-01-16 ENCOUNTER — Telehealth (HOSPITAL_COMMUNITY): Payer: Self-pay | Admitting: Licensed Clinical Social Worker

## 2021-01-16 NOTE — Telephone Encounter (Signed)
Pt did not present for her session. Cln called pt and she did not answer. Cln text pt. Pt did not join session.  Murlene Revell Thea Silversmith, LCAS

## 2021-01-22 ENCOUNTER — Other Ambulatory Visit: Payer: Self-pay

## 2021-01-22 ENCOUNTER — Encounter (HOSPITAL_COMMUNITY): Payer: Self-pay | Admitting: Licensed Clinical Social Worker

## 2021-01-22 ENCOUNTER — Ambulatory Visit (INDEPENDENT_AMBULATORY_CARE_PROVIDER_SITE_OTHER): Payer: No Typology Code available for payment source | Admitting: Licensed Clinical Social Worker

## 2021-01-22 DIAGNOSIS — F4310 Post-traumatic stress disorder, unspecified: Secondary | ICD-10-CM | POA: Diagnosis not present

## 2021-01-22 DIAGNOSIS — F331 Major depressive disorder, recurrent, moderate: Secondary | ICD-10-CM | POA: Diagnosis not present

## 2021-01-22 NOTE — Progress Notes (Signed)
Virtual Visit via Phone Note  I connected with Susan Guerra on 01/22/21 at  9:00 AM EDT by a phone enabled telemedicine application and verified that I am speaking with the correct person using two identifiers.  Location: Patient: friend's house Provider: home office   I discussed the limitations of evaluation and management by telemedicine and the availability of in person appointments. The patient expressed understanding and agreed to proceed.  History of Present Illness: Pt is referred to therapy for PTSD (MST) and MDD by the [facility]. Her biggest stressor is her 43 yo son who has explosive aspbergers and 2 younger children (14 & 5). She lives with her "husband." Pt has fibromyalgia, which affects her ability to take care of all her family's needs.  She has previous therapy.    Observations/Objective: Patient presented for today's session on time and was alert, oriented x5, with no evidence or self-report of SI/HI or A/V H. Patient reported ongoing compliance with medication and denied any use of alcohol or illicit substances.  Clinician inquired about patient's current emotional ratings, as well as any significant changes in thoughts, feelings or behavior since previous session.  Patient reported scores of 8/10 for depression, 8/10 for anxiety, 8/10 for anger/irritability. Patient discusses her missed appointment last week. "I moved out of my house with my 59 yo daughter because my husband was stressed and overwhelmed." Cln used Socratic questions. Pt was tearful in her description of the relationship with her husband currently. Pt continues to take care of everyone even though she's not living in the home. Cln, again, provided education on the benefits of boundaries and the necessity for her to use them to protect her own mental and physical health. Cln provided education on the benefits of problem solving to move forward with the relationship. Cln explored coping skills that may be  beneficial during this time period.   Assessment and Plan: Counselor will continue to meet with patient to address treatment plan goals. Patient will continue to follow recommendations of providers and implement skills learned in session.   Follow Up Instructions: I discussed the assessment and treatment plan with the patient. The patient was provided an opportunity to ask questions and all were answered. The patient agreed with the plan and demonstrated an understanding of the instructions.   The patient was advised to call back or seek an in-person evaluation if the symptoms worsen or if the condition fails to improve as anticipated.  I provided 45 minutes of non-face-to-face time during this encounter.   Slayden Mennenga S, LCAS

## 2021-01-29 ENCOUNTER — Encounter (HOSPITAL_COMMUNITY): Payer: Self-pay | Admitting: Licensed Clinical Social Worker

## 2021-01-29 ENCOUNTER — Ambulatory Visit (INDEPENDENT_AMBULATORY_CARE_PROVIDER_SITE_OTHER): Payer: No Typology Code available for payment source | Admitting: Licensed Clinical Social Worker

## 2021-01-29 ENCOUNTER — Other Ambulatory Visit: Payer: Self-pay

## 2021-01-29 DIAGNOSIS — F4310 Post-traumatic stress disorder, unspecified: Secondary | ICD-10-CM | POA: Diagnosis not present

## 2021-01-29 DIAGNOSIS — F331 Major depressive disorder, recurrent, moderate: Secondary | ICD-10-CM | POA: Diagnosis not present

## 2021-01-29 NOTE — Progress Notes (Signed)
Virtual Visit via Video Note  I connected with Susan Guerra on 01/29/21 at  9:00 AM EDT by a video enabled telemedicine application and verified that I am speaking with the correct person using two identifiers.  Location: Patient: home Provider: home office   I discussed the limitations of evaluation and management by telemedicine and the availability of in person appointments. The patient expressed understanding and agreed to proceed.  History of Present Illness: Pt is referred to therapy for PTSD (MST) and MDD by the [facility]. Her biggest stressor is her 43 yo son who has explosive aspbergers and 2 younger children (14 & 5). She lives with her "husband." Pt has fibromyalgia, which affects her ability to take care of all her family's needs.  She has previous therapy.    Observations/Objective: Patient presented for today's session on time and was alert, oriented x5, with no evidence or self-report of SI/HI or A/V H. Patient reported ongoing compliance with medication and denied any use of alcohol or illicit substances.  Clinician inquired about patient's current emotional ratings, as well as any significant changes in thoughts, feelings or behavior since previous session.  Patient reported scores of 8/10 for depression, 8/10 for anxiety, 8/10 for anger/irritability. Pt reports she has moved back home. Cln used socratic questions. Pt reports she is using her boundaries and is problem-solving with her husband. Cln provided psychoeducation on the manifestation of her depressed mood and repression of her feelings.      Assessment and Plan: Counselor will continue to meet with patient to address treatment plan goals. Patient will continue to follow recommendations of providers and implement skills learned in session.   Follow Up Instructions: I discussed the assessment and treatment plan with the patient. The patient was provided an opportunity to ask questions and all were answered. The  patient agreed with the plan and demonstrated an understanding of the instructions.   The patient was advised to call back or seek an in-person evaluation if the symptoms worsen or if the condition fails to improve as anticipated.  I provided 45 minutes of non-face-to-face time during this encounter.   Lamine Laton S, LCAS

## 2021-02-05 ENCOUNTER — Ambulatory Visit (HOSPITAL_COMMUNITY): Payer: No Typology Code available for payment source | Admitting: Licensed Clinical Social Worker

## 2021-02-05 ENCOUNTER — Other Ambulatory Visit: Payer: Self-pay

## 2021-02-05 ENCOUNTER — Telehealth (HOSPITAL_COMMUNITY): Payer: Self-pay | Admitting: Licensed Clinical Social Worker

## 2021-02-05 NOTE — Telephone Encounter (Signed)
Pt did not present for her individual therapy session. Called and text patient. She did not join. Ottis Stain, MS, LCAS

## 2021-02-12 ENCOUNTER — Other Ambulatory Visit: Payer: Self-pay

## 2021-02-12 ENCOUNTER — Encounter (HOSPITAL_COMMUNITY): Payer: Self-pay | Admitting: Licensed Clinical Social Worker

## 2021-02-12 ENCOUNTER — Ambulatory Visit (INDEPENDENT_AMBULATORY_CARE_PROVIDER_SITE_OTHER): Payer: No Typology Code available for payment source | Admitting: Licensed Clinical Social Worker

## 2021-02-12 DIAGNOSIS — F4310 Post-traumatic stress disorder, unspecified: Secondary | ICD-10-CM | POA: Diagnosis not present

## 2021-02-12 DIAGNOSIS — F331 Major depressive disorder, recurrent, moderate: Secondary | ICD-10-CM

## 2021-02-12 NOTE — Progress Notes (Signed)
Virtual Visit via Video Note  I connected with Susan Guerra on 02/12/21 at  9:00 AM EDT by a video enabled telemedicine application and verified that I am speaking with the correct person using two identifiers.  Location: Patient: home Provider: home office   I discussed the limitations of evaluation and management by telemedicine and the availability of in person appointments. The patient expressed understanding and agreed to proceed.  History of Present Illness: Pt is referred to therapy for PTSD (MST) and MDD by the [facility]. Her biggest stressor is her 80 yo son who has explosive aspbergers and 2 younger children (14 & 5). She lives with her "husband." Pt has fibromyalgia, which affects her ability to take care of all her family's needs.  She has previous therapy.    Observations/Objective: Patient presented for today's session on time and was alert, oriented x5, with no evidence or self-report of SI/HI or A/V H. Patient reported ongoing compliance with medication and denied any use of alcohol or illicit substances.  Clinician inquired about patient's current emotional ratings, as well as any significant changes in thoughts, feelings or behavior since previous session.  Patient reported scores of 8/10 for depression, 8/10 for anxiety, 8/10 for anger/irritability. Pt began a discussion tearfully in her description of her MST, which was triggered yesterday by intimacy with her husband. Cln assisted pt by identifying triggering factors. Cln provided a safe platform for pt to discuss her triggers and MST. Will continue at next session.      Assessment and Plan: Counselor will continue to meet with patient to address treatment plan goals. Patient will continue to follow recommendations of providers and implement skills learned in session.   Follow Up Instructions: I discussed the assessment and treatment plan with the patient. The patient was provided an opportunity to ask questions  and all were answered. The patient agreed with the plan and demonstrated an understanding of the instructions.   The patient was advised to call back or seek an in-person evaluation if the symptoms worsen or if the condition fails to improve as anticipated.  I provided 45 minutes of non-face-to-face time during this encounter.   Kendell Sagraves S, LCAS

## 2021-02-19 ENCOUNTER — Encounter (HOSPITAL_COMMUNITY): Payer: Self-pay | Admitting: Licensed Clinical Social Worker

## 2021-02-19 ENCOUNTER — Ambulatory Visit (INDEPENDENT_AMBULATORY_CARE_PROVIDER_SITE_OTHER): Payer: No Typology Code available for payment source | Admitting: Licensed Clinical Social Worker

## 2021-02-19 ENCOUNTER — Other Ambulatory Visit: Payer: Self-pay

## 2021-02-19 DIAGNOSIS — F4310 Post-traumatic stress disorder, unspecified: Secondary | ICD-10-CM

## 2021-02-19 DIAGNOSIS — F331 Major depressive disorder, recurrent, moderate: Secondary | ICD-10-CM

## 2021-02-19 NOTE — Progress Notes (Signed)
Virtual Visit via Video Note  I connected with Susan Guerra on 02/19/21 at  2:00 PM EDT by a video enabled telemedicine application and verified that I am speaking with the correct person using two identifiers.  Location: Patient: home Provider: home office   I discussed the limitations of evaluation and management by telemedicine and the availability of in person appointments. The patient expressed understanding and agreed to proceed.  History of Present Illness: Pt is referred to therapy for PTSD (MST) and MDD by the [facility]. Her biggest stressor is her 74 yo son who has explosive aspbergers and 2 younger children (14 & 5). She lives with her "husband." Pt has fibromyalgia, which affects her ability to take care of all her family's needs.  She has previous therapy.    Observations/Objective: Patient presented for today's session on time and was alert, oriented x5, with no evidence or self-report of SI/HI or A/V H. Patient reported ongoing compliance with medication and denied any use of alcohol or illicit substances.  Clinician inquired about patient's current emotional ratings, as well as any significant changes in thoughts, feelings or behavior since previous session.  Patient reported scores of 8/10 for depression, 8/10 for anxiety, 8/10 for anger/irritability. Pt continued her discussion tearfully of her MST.  Pt described the current relationship with her "husband" after the triggers during their last time of intimacy.Cln explored with pt a "safe word or action" to use while intimate.     Assessment and Plan: Counselor will continue to meet with patient to address treatment plan goals. Patient will continue to follow recommendations of providers and implement skills learned in session.   Follow Up Instructions: I discussed the assessment and treatment plan with the patient. The patient was provided an opportunity to ask questions and all were answered. The patient agreed with  the plan and demonstrated an understanding of the instructions.   The patient was advised to call back or seek an in-person evaluation if the symptoms worsen or if the condition fails to improve as anticipated.  I provided 45 minutes of non-face-to-face time during this encounter.   Juaquin Ludington S, LCAS

## 2021-02-26 ENCOUNTER — Other Ambulatory Visit: Payer: Self-pay

## 2021-02-26 ENCOUNTER — Ambulatory Visit (INDEPENDENT_AMBULATORY_CARE_PROVIDER_SITE_OTHER): Payer: No Typology Code available for payment source | Admitting: Licensed Clinical Social Worker

## 2021-02-26 ENCOUNTER — Encounter (HOSPITAL_COMMUNITY): Payer: Self-pay | Admitting: Licensed Clinical Social Worker

## 2021-02-26 DIAGNOSIS — F4310 Post-traumatic stress disorder, unspecified: Secondary | ICD-10-CM | POA: Diagnosis not present

## 2021-02-26 DIAGNOSIS — F331 Major depressive disorder, recurrent, moderate: Secondary | ICD-10-CM | POA: Diagnosis not present
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
## 2021-03-05 ENCOUNTER — Encounter (HOSPITAL_COMMUNITY): Payer: Self-pay | Admitting: Licensed Clinical Social Worker

## 2021-03-05 ENCOUNTER — Ambulatory Visit (INDEPENDENT_AMBULATORY_CARE_PROVIDER_SITE_OTHER): Payer: No Typology Code available for payment source | Admitting: Licensed Clinical Social Worker

## 2021-03-05 ENCOUNTER — Other Ambulatory Visit: Payer: Self-pay

## 2021-03-05 DIAGNOSIS — F4310 Post-traumatic stress disorder, unspecified: Secondary | ICD-10-CM | POA: Diagnosis not present

## 2021-03-05 DIAGNOSIS — F331 Major depressive disorder, recurrent, moderate: Secondary | ICD-10-CM
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
## 2021-03-12 ENCOUNTER — Other Ambulatory Visit: Payer: Self-pay

## 2021-03-12 ENCOUNTER — Encounter (HOSPITAL_COMMUNITY): Payer: Self-pay | Admitting: Licensed Clinical Social Worker

## 2021-03-12 ENCOUNTER — Ambulatory Visit (INDEPENDENT_AMBULATORY_CARE_PROVIDER_SITE_OTHER): Payer: No Typology Code available for payment source | Admitting: Licensed Clinical Social Worker

## 2021-03-12 DIAGNOSIS — F4310 Post-traumatic stress disorder, unspecified: Secondary | ICD-10-CM | POA: Diagnosis not present

## 2021-03-12 DIAGNOSIS — F331 Major depressive disorder, recurrent, moderate: Secondary | ICD-10-CM
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
## 2021-03-19 ENCOUNTER — Encounter (HOSPITAL_COMMUNITY): Payer: Self-pay | Admitting: Licensed Clinical Social Worker

## 2021-03-19 ENCOUNTER — Ambulatory Visit (INDEPENDENT_AMBULATORY_CARE_PROVIDER_SITE_OTHER): Payer: No Typology Code available for payment source | Admitting: Licensed Clinical Social Worker

## 2021-03-19 ENCOUNTER — Other Ambulatory Visit: Payer: Self-pay

## 2021-03-19 DIAGNOSIS — F4310 Post-traumatic stress disorder, unspecified: Secondary | ICD-10-CM | POA: Diagnosis not present

## 2021-03-19 DIAGNOSIS — F331 Major depressive disorder, recurrent, moderate: Secondary | ICD-10-CM | POA: Diagnosis not present

## 2021-03-19 NOTE — Progress Notes (Signed)
Virtual Visit via Video Note   I connected with Susan Guerra on 03/19/21 at  9:00 AM EDT by a video enabled telemedicine application and verified that I am speaking with the correct person using two identifiers.   Location: Patient: home Provider: home office   I discussed the limitations of evaluation and management by telemedicine and the availability of in person appointments. The patient expressed understanding and agreed to proceed.   History of Present Illness: Pt is referred to therapy for PTSD (MST) and MDD by the [facility]. Her biggest stressor is her 1 yo son who has explosive aspbergers and 2 younger children (14 & 5). She lives with her "husband." Pt has fibromyalgia, which affects her ability to take care of all her family's needs.  She has previous therapy.     ObserSurvival mode can be triggered by various things, such as when we experience trauma, loss, or changes in our life.vations/Objective: Patient presented for today's session on time and was alert, oriented x5, with no evidence or self-report of SI/HI or A/V H. Patient reported ongoing compliance with medication and denied any use of alcohol or illicit substances.  Clinician inquired about patient's current emotional ratings, as well as any significant changes in thoughts, feelings or behavior since previous session.  Patient's ratings: 5/10 for depression, 5/10 for anxiety, 3/10 for anger/irritability. Pt shared "I've had a good week with fewer stressors and fewer mood changes." Cln used Socratic questions. Pt discussed childhood memories of discipline vs current discipline of children. "I'm the laid back parent, like my mom vs my father and my husband are the disciplinarians. Pt described her demeanor has changed over the past few years because of medication stabilization. Pt began a discussion: being in survivalist mode. Cln provided education on "being a survivalist," which  can be triggered by various things, such as  trauma, loss or life changes. Cln will explore how to get out of the survivalist mode.          PLAN: childhood relationship with father, effects of relationship with half sister,         Assessment and Plan: Counselor will continue to meet with patient to address treatment plan goals. Patient will continue to follow recommendations of providers and implement skills learned in session.     Follow Up Instructions: I discussed the assessment and treatment plan with the patient. The patient was provided an opportunity to ask questions and all were answered. The patient agreed with the plan and demonstrated an understanding of the instructions.   The patient was advised to call back or seek an in-person evaluation if the symptoms worsen or if the condition fails to improve as anticipated.   I provided 45 minutes of non-face-to-face time during this encounter.     Darrnell Mangiaracina S, LCAS

## 2021-03-25 ENCOUNTER — Other Ambulatory Visit: Payer: Self-pay

## 2021-03-25 ENCOUNTER — Encounter (HOSPITAL_COMMUNITY): Payer: Self-pay | Admitting: Licensed Clinical Social Worker

## 2021-03-25 ENCOUNTER — Ambulatory Visit (INDEPENDENT_AMBULATORY_CARE_PROVIDER_SITE_OTHER): Payer: No Typology Code available for payment source | Admitting: Licensed Clinical Social Worker

## 2021-03-25 DIAGNOSIS — F4310 Post-traumatic stress disorder, unspecified: Secondary | ICD-10-CM

## 2021-03-25 DIAGNOSIS — F331 Major depressive disorder, recurrent, moderate: Secondary | ICD-10-CM
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
## 2021-04-02 ENCOUNTER — Encounter (HOSPITAL_COMMUNITY): Payer: Self-pay | Admitting: Licensed Clinical Social Worker

## 2021-04-02 ENCOUNTER — Other Ambulatory Visit: Payer: Self-pay

## 2021-04-02 ENCOUNTER — Ambulatory Visit (INDEPENDENT_AMBULATORY_CARE_PROVIDER_SITE_OTHER): Payer: No Typology Code available for payment source | Admitting: Licensed Clinical Social Worker

## 2021-04-02 DIAGNOSIS — F4310 Post-traumatic stress disorder, unspecified: Secondary | ICD-10-CM | POA: Diagnosis not present

## 2021-04-02 DIAGNOSIS — F331 Major depressive disorder, recurrent, moderate: Secondary | ICD-10-CM

## 2021-04-02 NOTE — Progress Notes (Addendum)
Virtual Visit via Phone Note   I connected with Susan Guerra on 04/02/21 at  9:00 AM EDT by a phone enabled telemedicine application and verified that I am speaking with the correct person using two identifiers.   Location: Patient: home Provider: home office   I discussed the limitations of evaluation and management by telemedicine and the availability of in person appointments. The patient expressed understanding and agreed to proceed.   History of Present Illness: Pt is referred to therapy for PTSD (MST) and MDD by the [facility]. Her biggest stressor is her 43 yo son who has explosive aspbergers and 2 younger children (14 & 5). She lives with her "husband." Pt has fibromyalgia, which affects her ability to take care of all her family's needs.  She has previous therapy.     Observations/Objective: Patient presented for today's session on time and was alert, oriented x5, with no evidence or self-report of SI/HI or A/V H. Patient reported ongoing compliance with medication and denied any use of alcohol or illicit substances.  Clinician inquired about patient's current emotional ratings, as well as any significant changes in thoughts, feelings or behavior since previous session.  Patient's ratings: 5/10 for depression, 5/10 for anxiety, 3/10 for anger/irritability. Pt reports her whole family has had Covid for the past week. "I had it worse and have been really sick for the past week. I'm going to have to end the session early because I'm going to my PCP to get the booster vaccine." Cln used socratic questions. 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 Cln ended session by teaching pt the benefits and process of mindfulness skills, while pt practiced in session.      PLAN: survivalist mode, husband's  drinking. New job         PLAN: childhood relationship with father, effects of relationship with half sister       Assessment and Plan: Counselor will continue to meet with patient to address treatment plan goals. Patient will continue to follow recommendations of providers and implement skills learned in session.     Follow Up Instructions: I discussed the assessment and treatment plan with the patient. The patient was provided an opportunity to ask questions and all were answered. The patient agreed with the plan and demonstrated an understanding of the instructions.   The patient was advised to call back or seek an in-person evaluation if the symptoms worsen or if the condition fails to improve as anticipated.   I provided 30 minutes of non-face-to-face time during this encounter.     Yashua Bracco S, LCAS

## 2021-04-09 ENCOUNTER — Encounter (HOSPITAL_COMMUNITY): Payer: Self-pay | Admitting: Licensed Clinical Social Worker

## 2021-04-09 ENCOUNTER — Other Ambulatory Visit: Payer: Self-pay

## 2021-04-09 ENCOUNTER — Ambulatory Visit (INDEPENDENT_AMBULATORY_CARE_PROVIDER_SITE_OTHER): Payer: No Typology Code available for payment source | Admitting: Licensed Clinical Social Worker

## 2021-04-09 DIAGNOSIS — F4310 Post-traumatic stress disorder, unspecified: Secondary | ICD-10-CM

## 2021-04-09 DIAGNOSIS — F331 Major depressive disorder, recurrent, moderate: Secondary | ICD-10-CM
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
## 2021-04-16 ENCOUNTER — Encounter (HOSPITAL_COMMUNITY): Payer: Self-pay | Admitting: Licensed Clinical Social Worker

## 2021-04-16 ENCOUNTER — Other Ambulatory Visit: Payer: Self-pay

## 2021-04-16 ENCOUNTER — Ambulatory Visit (INDEPENDENT_AMBULATORY_CARE_PROVIDER_SITE_OTHER): Payer: No Typology Code available for payment source | Admitting: Licensed Clinical Social Worker

## 2021-04-16 DIAGNOSIS — F4310 Post-traumatic stress disorder, unspecified: Secondary | ICD-10-CM

## 2021-04-16 DIAGNOSIS — F331 Major depressive disorder, recurrent, moderate: Secondary | ICD-10-CM | POA: Diagnosis not present

## 2021-04-16 NOTE — Progress Notes (Signed)
Virtual Visit via Video Note   I connected with Susan Guerra on 04/16/21 at  9:00 AM EDT by a video enabled telemedicine application and verified that I am speaking with the correct person using two identifiers.   Location: Patient: work Provider: home office   I discussed the limitations of evaluation and management by telemedicine and the availability of in person appointments. The patient expressed understanding and agreed to proceed.   History of Present Illness: Pt is referred to therapy for PTSD (MST) and MDD by the [facility]. Her biggest stressor is her 43 yo son who has explosive aspbergers and 2 younger children (14 & 5). She lives with her "husband." Pt has fibromyalgia, which affects her ability to take care of all her family's needs.  She has previous therapy.     Observations/Objective: Patient presented for today's session on time and was alert, oriented x5, with no evidence or self-report of SI/HI or A/V H. Patient reported ongoing compliance with medication and denied any use of alcohol or illicit substances.  Clinician inquired about patient's current emotional ratings, as well as any significant changes in thoughts, feelings or behavior since previous session.  Patient's ratings: 6/10 for depression, 6/10 for anxiety, 3/10 for anger/irritability, trauma emotion rating 50% within the past week. All ratings are evidenced by self report. Cln explored trauma emotion rating with pt who identified her triggers within the past week for trauma. Cln and pt explored coping skills appropriate for trauma. Pt's older son came home to a smooth transition, which has made the home life safe for everyone. Pt continues to like her new job and has had a smooth transition. Pt was tearful in sharing the death of her 2nd mom. Cln and pt explored ways to care for her best friend and care for herself in dealing with her grief, working out a plan. Cln provided brief psychoeducation on grief. Again,  Cln ended session by teaching pt the benefits and process of mindfulness skills, while pt practiced in session.     PLAN: survivalist mode, husband's drinking.          PLAN: childhood relationship with father, effects of relationship with half sister       Assessment and Plan: Counselor will continue to meet with patient to address treatment plan goals. Patient will continue to follow recommendations of providers and implement skills learned in session.     Follow Up Instructions: I discussed the assessment and treatment plan with the patient. The patient was provided an opportunity to ask questions and all were answered. The patient agreed with the plan and demonstrated an understanding of the instructions.   The patient was advised to call back or seek an in-person evaluation if the symptoms worsen or if the condition fails to improve as anticipated.   I provided 45 minutes of non-face-to-face time during this encounter.     Leslie Langille S, LCAS

## 2021-04-24 ENCOUNTER — Ambulatory Visit (HOSPITAL_COMMUNITY): Payer: No Typology Code available for payment source | Admitting: Licensed Clinical Social Worker

## 2021-04-25 ENCOUNTER — Other Ambulatory Visit: Payer: Self-pay

## 2021-04-25 ENCOUNTER — Ambulatory Visit (INDEPENDENT_AMBULATORY_CARE_PROVIDER_SITE_OTHER): Payer: No Typology Code available for payment source | Admitting: Licensed Clinical Social Worker

## 2021-04-25 ENCOUNTER — Encounter (HOSPITAL_COMMUNITY): Payer: Self-pay | Admitting: Licensed Clinical Social Worker

## 2021-04-25 DIAGNOSIS — F4310 Post-traumatic stress disorder, unspecified: Secondary | ICD-10-CM

## 2021-04-25 DIAGNOSIS — F331 Major depressive disorder, recurrent, moderate: Secondary | ICD-10-CM
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
## 2021-04-30 ENCOUNTER — Encounter (HOSPITAL_COMMUNITY): Payer: Self-pay | Admitting: Licensed Clinical Social Worker

## 2021-04-30 ENCOUNTER — Other Ambulatory Visit: Payer: Self-pay

## 2021-04-30 ENCOUNTER — Ambulatory Visit (INDEPENDENT_AMBULATORY_CARE_PROVIDER_SITE_OTHER): Payer: No Typology Code available for payment source | Admitting: Licensed Clinical Social Worker

## 2021-04-30 DIAGNOSIS — F331 Major depressive disorder, recurrent, moderate: Secondary | ICD-10-CM | POA: Diagnosis not present

## 2021-04-30 DIAGNOSIS — F4310 Post-traumatic stress disorder, unspecified: Secondary | ICD-10-CM | POA: Diagnosis not present
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
## 2021-05-07 ENCOUNTER — Encounter (HOSPITAL_COMMUNITY): Payer: Self-pay | Admitting: Licensed Clinical Social Worker

## 2021-05-07 ENCOUNTER — Other Ambulatory Visit: Payer: Self-pay

## 2021-05-07 ENCOUNTER — Ambulatory Visit (INDEPENDENT_AMBULATORY_CARE_PROVIDER_SITE_OTHER): Payer: No Typology Code available for payment source | Admitting: Licensed Clinical Social Worker

## 2021-05-07 DIAGNOSIS — F331 Major depressive disorder, recurrent, moderate: Secondary | ICD-10-CM | POA: Diagnosis not present

## 2021-05-07 DIAGNOSIS — F4310 Post-traumatic stress disorder, unspecified: Secondary | ICD-10-CM
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
## 2021-05-14 ENCOUNTER — Ambulatory Visit (INDEPENDENT_AMBULATORY_CARE_PROVIDER_SITE_OTHER): Payer: No Typology Code available for payment source | Admitting: Licensed Clinical Social Worker

## 2021-05-14 ENCOUNTER — Encounter (HOSPITAL_COMMUNITY): Payer: Self-pay | Admitting: Licensed Clinical Social Worker

## 2021-05-14 ENCOUNTER — Other Ambulatory Visit: Payer: Self-pay

## 2021-05-14 DIAGNOSIS — F4310 Post-traumatic stress disorder, unspecified: Secondary | ICD-10-CM | POA: Diagnosis not present

## 2021-05-14 DIAGNOSIS — F331 Major depressive disorder, recurrent, moderate: Secondary | ICD-10-CM

## 2021-05-14 NOTE — Progress Notes (Signed)
Virtual Visit via Video Note   I connected with Susan Guerra on 05/14/21 at  9:15 AM EDT by a video enabled telemedicine application and verified that I am speaking with the correct person using two identifiers.   Location: Patient: home Provider: home office   I discussed the limitations of evaluation and management by telemedicine and the availability of in person appointments. The patient expressed understanding and agreed to proceed.   History of Present Illness: Pt is referred to therapy for PTSD (MST) and MDD by the [facility]. Her biggest stressor is her 67 yo son who has explosive aspbergers and 2 younger children (14 & 5). She lives with her "husband." Pt has fibromyalgia, which affects her ability to take care of all her family's needs.  She has previous therapy.     Observations/Objective: Patient presented for today's session on time and was alert, oriented x5, with no evidence or self-report of SI/HI or A/V H. Patient reported ongoing compliance with medication and denied any use of alcohol or illicit substances.  Clinician inquired about patient's current emotional ratings, as well as any significant changes in thoughts, feelings or behavior since previous session.  Patient's ratings: 7/10 for depression, 7/10 for anxiety, 4/10 for anger/irritability, trauma emotion rating 50% within the past week. All ratings are evidenced by self report. Cln explored trauma emotion rating with pt who identified her trauma triggers within the past week. Pt reports on her biggest stressor during the past week: relationship with lt-relationship. "I'm ready to move out of the relationship." Cln and pt explored her decision, asissting pt making pros/cons of decision. Cln and pt role-played how to begin the conversation with her partner. Cln suggested pt write down what she would like to say and practice it during the week. Will review at next session.    Assessment and Plan: Counselor will continue  to meet with patient to address treatment plan goals. Patient will continue to follow recommendations of providers and implement skills learned in session.     Follow Up Instructions: I discussed the assessment and treatment plan with the patient. The patient was provided an opportunity to ask questions and all were answered. The patient agreed with the plan and demonstrated an understanding of the instructions.   The patient was advised to call back or seek an in-person evaluation if the symptoms worsen or if the condition fails to improve as anticipated.   I provided 45 minutes of non-face-to-face time during this encounter.     Victorhugo Preis S, LCAS

## 2021-05-21 ENCOUNTER — Other Ambulatory Visit: Payer: Self-pay

## 2021-05-21 ENCOUNTER — Ambulatory Visit (HOSPITAL_COMMUNITY): Payer: No Typology Code available for payment source | Admitting: Licensed Clinical Social Worker

## 2021-05-22 ENCOUNTER — Ambulatory Visit (INDEPENDENT_AMBULATORY_CARE_PROVIDER_SITE_OTHER): Payer: No Typology Code available for payment source | Admitting: Licensed Clinical Social Worker

## 2021-05-22 ENCOUNTER — Encounter (HOSPITAL_COMMUNITY): Payer: Self-pay | Admitting: Licensed Clinical Social Worker

## 2021-05-22 ENCOUNTER — Other Ambulatory Visit: Payer: Self-pay

## 2021-05-22 DIAGNOSIS — F4310 Post-traumatic stress disorder, unspecified: Secondary | ICD-10-CM

## 2021-05-22 DIAGNOSIS — F331 Major depressive disorder, recurrent, moderate: Secondary | ICD-10-CM
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
## 2021-05-28 ENCOUNTER — Other Ambulatory Visit: Payer: Self-pay

## 2021-05-28 ENCOUNTER — Encounter (HOSPITAL_COMMUNITY): Payer: Self-pay | Admitting: Licensed Clinical Social Worker

## 2021-05-28 ENCOUNTER — Ambulatory Visit (INDEPENDENT_AMBULATORY_CARE_PROVIDER_SITE_OTHER): Payer: No Typology Code available for payment source | Admitting: Licensed Clinical Social Worker

## 2021-05-28 DIAGNOSIS — F4310 Post-traumatic stress disorder, unspecified: Secondary | ICD-10-CM | POA: Diagnosis not present

## 2021-05-28 DIAGNOSIS — F331 Major depressive disorder, recurrent, moderate: Secondary | ICD-10-CM

## 2021-05-28 NOTE — Progress Notes (Signed)
Virtual Visit via Video Note   I connected with Susan Guerra on 05/28/21 at  9:15 am EDT by a video enabled telemedicine application and verified that I am speaking with the correct person using two identifiers.   Location: Patient: home Provider: home office   I discussed the limitations of evaluation and management by telemedicine and the availability of in person appointments. The patient expressed understanding and agreed to proceed.   History of Present Illness: Pt is referred to therapy for PTSD (MST) and MDD by the [facility]. Her biggest stressor is her 1 yo son who has explosive aspbergers and 2 younger children (14 & 5). She lives with her "husband." Pt has fibromyalgia, which affects her ability to take care of all her family's needs.  She has previous therapy.     Observations/Objective: Patient presented for today's session on time and was alert, oriented x5, with no evidence or self-report of SI/HI or A/V H. Patient reported ongoing compliance with medication and denied any use of alcohol or illicit substances.  Clinician inquired about patient's current emotional ratings, as well as any significant changes in thoughts, feelings or behavior since previous session.  Patient's ratings: 8/10 for depression, 8/10 for anxiety, 6/10 for anger/irritability, trauma emotion rating 50% within the past week. All ratings are evidenced by self report. Cln explored trauma emotion rating with pt who identified her trauma triggers within the past week. Pt reports on her continued stressor during the past week: relationship with father of her daughter. Again, pt described her relationship with the father of her daughter and how it is increasing her emotional ratings. Again, cln provided education on enabling and boundaries, the relationship is being effected by her enabling his behavior and not using boundaries. Cln and pt role played enabling and boundaries even with family members. Cln explored  mindfulness skills with pt.     Assessment and Plan: Counselor will continue to meet with patient to address treatment plan goals. Patient will continue to follow recommendations of providers and implement skills learned in session.     Follow Up Instructions: I discussed the assessment and treatment plan with the patient. The patient was provided an opportunity to ask questions and all were answered. The patient agreed with the plan and demonstrated an understanding of the instructions.   The patient was advised to call back or seek an in-person evaluation if the symptoms worsen or if the condition fails to improve as anticipated.   I provided 45 minutes of non-face-to-face time during this encounter.     Joniece Smotherman S, LCAS

## 2021-06-04 ENCOUNTER — Encounter (HOSPITAL_COMMUNITY): Payer: Self-pay | Admitting: Licensed Clinical Social Worker

## 2021-06-04 ENCOUNTER — Ambulatory Visit (INDEPENDENT_AMBULATORY_CARE_PROVIDER_SITE_OTHER): Payer: No Typology Code available for payment source | Admitting: Licensed Clinical Social Worker

## 2021-06-04 ENCOUNTER — Other Ambulatory Visit: Payer: Self-pay

## 2021-06-04 DIAGNOSIS — F4310 Post-traumatic stress disorder, unspecified: Secondary | ICD-10-CM | POA: Diagnosis not present

## 2021-06-04 DIAGNOSIS — F331 Major depressive disorder, recurrent, moderate: Secondary | ICD-10-CM

## 2021-06-04 NOTE — Progress Notes (Signed)
Virtual Visit via Video Note   I connected with Susan Guerra on 06/04/21 at  9:15 am EDT by a video enabled telemedicine application and verified that I am speaking with the correct person using two identifiers.   Location: Patient: home Provider: home office   I discussed the limitations of evaluation and management by telemedicine and the availability of in person appointments. The patient expressed understanding and agreed to proceed.   History of Present Illness: Pt is referred to therapy for PTSD (MST) and MDD by the [facility]. Her biggest stressor is her 56 yo son who has explosive aspbergers and 2 younger children (14 & 5). She lives with her "husband." Pt has fibromyalgia, which affects her ability to take care of all her family's needs.  She has previous therapy.     Observations/Objective: Patient presented for today's session on time and was alert, oriented x5, with no evidence or self-report of SI/HI or A/V H. Patient reported ongoing compliance with medication and denied any use of alcohol or illicit substances.  Clinician inquired about patient's current emotional ratings, as well as any significant changes in thoughts, feelings or behavior since previous session.  Patient's ratings: 8/10 for depression, 8/10 for anxiety, 7/10 for anger/irritability, trauma emotion rating 50% within the past week. All ratings are evidenced by self report. Cln explored trauma emotion rating with pt who identified her trauma triggers within the past week. Pt reports on her continued stressors during the past week: "Everyone comes to me for problem-solving. It is overwhelming and frustrating." Cln and pt role played stressful scenarios by with problem-solving. Cln reported the [facility] has not been responsive to requests for re-authorization and suggested she contact them for further instructions.        Assessment and Plan: Counselor will continue to meet with patient to address  treatment plan goals. Patient will continue to follow recommendations of providers and implement skills learned in session.     Follow Up Instructions: I discussed the assessment and treatment plan with the patient. The patient was provided an opportunity to ask questions and all were answered. The patient agreed with the plan and demonstrated an understanding of the instructions.   The patient was advised to call back or seek an in-person evaluation if the symptoms worsen or if the condition fails to improve as anticipated.   I provided 45 minutes of non-face-to-face time during this encounter.     Hawkins Seaman S, LCAS

## 2021-06-11 ENCOUNTER — Ambulatory Visit (HOSPITAL_COMMUNITY): Payer: Self-pay | Admitting: Licensed Clinical Social Worker

## 2021-06-11 ENCOUNTER — Other Ambulatory Visit: Payer: Self-pay

## 2021-06-18 ENCOUNTER — Ambulatory Visit (HOSPITAL_COMMUNITY): Payer: Self-pay | Admitting: Licensed Clinical Social Worker

## 2021-06-18 ENCOUNTER — Other Ambulatory Visit: Payer: Self-pay

## 2021-06-26 ENCOUNTER — Ambulatory Visit (INDEPENDENT_AMBULATORY_CARE_PROVIDER_SITE_OTHER): Payer: No Typology Code available for payment source | Admitting: Licensed Clinical Social Worker

## 2021-06-26 ENCOUNTER — Encounter (HOSPITAL_COMMUNITY): Payer: Self-pay | Admitting: Licensed Clinical Social Worker

## 2021-06-26 ENCOUNTER — Ambulatory Visit (HOSPITAL_COMMUNITY): Payer: No Typology Code available for payment source | Admitting: Licensed Clinical Social Worker

## 2021-06-26 ENCOUNTER — Other Ambulatory Visit: Payer: Self-pay

## 2021-06-26 DIAGNOSIS — F331 Major depressive disorder, recurrent, moderate: Secondary | ICD-10-CM

## 2021-06-26 DIAGNOSIS — F4310 Post-traumatic stress disorder, unspecified: Secondary | ICD-10-CM

## 2021-06-26 NOTE — Progress Notes (Signed)
Virtual Visit via Video Note   I connected with Susan Guerra on 06/26/21 at  4:15 am EST by a video enabled telemedicine application and verified that I am speaking with the correct person using two identifiers.   Location: Patient: home Provider: home office   I discussed the limitations of evaluation and management by telemedicine and the availability of in person appointments. The patient expressed understanding and agreed to proceed.   History of Present Illness: Pt is referred to therapy for PTSD (MST) and MDD by the [facility]. Her biggest stressor is her 52 yo son who has explosive aspbergers and 2 younger children (14 & 5). She lives with her "husband." Pt has fibromyalgia, which affects her ability to take care of all her family's needs.  She has previous therapy.     Observations/Objective: Patient presented for today's session on time and was alert, oriented x5, with no evidence or self-report of SI/HI or A/V H. Patient reported ongoing compliance with medication and denied any use of alcohol or illicit substances.  Clinician inquired about patient's current emotional ratings, as well as any significant changes in thoughts, feelings or behavior since previous session.  Patient's ratings: 8/10 for depression, 8/10 for anxiety, 7/10 for anger/irritability, trauma emotion rating 50% within the past week. All ratings are evidenced by self report. Cln explored trauma emotion rating with pt who identified her trauma triggers within the past week. Pt reports on her continued stressors during the past 3 weeks: son's schooling, too much family opinions, core values. Cln reviewed tx plan with pt who verbalized acceptance of plan. The [facility] finally re-authorized patient for continued care.   Assessment and Plan: Counselor will continue to meet with patient to address treatment plan goals. Patient will continue to follow recommendations of providers and implement skills learned in  session.     Follow Up Instructions: I discussed the assessment and treatment plan with the patient. The patient was provided an opportunity to ask questions and all were answered. The patient agreed with the plan and demonstrated an understanding of the instructions.   The patient was advised to call back or seek an in-person evaluation if the symptoms worsen or if the condition fails to improve as anticipated.   I provided 45 minutes of non-face-to-face time during this encounter.     Renell Allum S, LCAS

## 2021-07-03 ENCOUNTER — Other Ambulatory Visit: Payer: Self-pay

## 2021-07-03 ENCOUNTER — Encounter (HOSPITAL_COMMUNITY): Payer: Self-pay | Admitting: Licensed Clinical Social Worker

## 2021-07-03 ENCOUNTER — Ambulatory Visit (INDEPENDENT_AMBULATORY_CARE_PROVIDER_SITE_OTHER): Payer: No Typology Code available for payment source | Admitting: Licensed Clinical Social Worker

## 2021-07-03 DIAGNOSIS — F331 Major depressive disorder, recurrent, moderate: Secondary | ICD-10-CM

## 2021-07-03 DIAGNOSIS — F4310 Post-traumatic stress disorder, unspecified: Secondary | ICD-10-CM | POA: Diagnosis not present

## 2021-07-03 NOTE — Progress Notes (Signed)
Virtual Visit via Video Note   I connected with Susan Guerra on 07/03/21 at  9:15 am EST by a video enabled telemedicine application and verified that I am speaking with the correct person using two identifiers.   Location: Patient: home Provider: home office   I discussed the limitations of evaluation and management by telemedicine and the availability of in person appointments. The patient expressed understanding and agreed to proceed.   History of Present Illness: Pt is referred to therapy for PTSD (MST) and MDD by the [facility]. Her biggest stressor is her 43 yo son who has explosive aspbergers and 2 younger children (14 & 5). She lives with her "husband." Pt has fibromyalgia, which affects her ability to take care of all her family's needs.  She has previous therapy.     Observations/Objective: Patient presented for today's session on time and was alert, oriented x5, with no evidence or self-report of SI/HI or A/V H. Patient reported ongoing compliance with medication and denied any use of alcohol or illicit substances.  Clinician inquired about patient's current emotional ratings, as well as any significant changes in thoughts, feelings or behavior since previous session.  Patient's ratings: 8/10 for depression, 8/10 for anxiety, 5/10 for anger/irritability, trauma emotion rating 50% within the past week. All ratings are evidenced by self report. Cln explored trauma emotion rating with pt who identified her trauma triggers within the past week. Pt reports on her continued stressors during the past  week: "husband' childhood friend's death, family issues. Cln explored grief with pt, suggesting Hospice for husband and providing education on the stages of grief. Clinician utilized MI OARS to reflect and summarize thoughts and feelings.and concerns.    Assessment and Plan: Counselor will continue to meet with patient to address treatment plan goals. Patient will continue to follow  recommendations of providers and implement skills learned in session.     Follow Up Instructions: I discussed the assessment and treatment plan with the patient. The patient was provided an opportunity to ask questions and all were answered. The patient agreed with the plan and demonstrated an understanding of the instructions.   The patient was advised to call back or seek an in-person evaluation if the symptoms worsen or if the condition fails to improve as anticipated.   I provided 45 minutes of non-face-to-face time during this encounter.     Brandonn Capelli S, LCAS

## 2021-07-10 ENCOUNTER — Ambulatory Visit (INDEPENDENT_AMBULATORY_CARE_PROVIDER_SITE_OTHER): Payer: No Typology Code available for payment source | Admitting: Licensed Clinical Social Worker

## 2021-07-10 ENCOUNTER — Other Ambulatory Visit: Payer: Self-pay

## 2021-07-10 ENCOUNTER — Encounter (HOSPITAL_COMMUNITY): Payer: Self-pay | Admitting: Licensed Clinical Social Worker

## 2021-07-10 DIAGNOSIS — F331 Major depressive disorder, recurrent, moderate: Secondary | ICD-10-CM | POA: Diagnosis not present

## 2021-07-10 DIAGNOSIS — F4310 Post-traumatic stress disorder, unspecified: Secondary | ICD-10-CM

## 2021-07-10 NOTE — Progress Notes (Signed)
Virtual Visit via Video Note   I connected with Susan Guerra on 07/10/21 at  9:30 am EST by a video enabled telemedicine application and verified that I am speaking with the correct person using two identifiers.   Location: Patient: home Provider: home office   I discussed the limitations of evaluation and management by telemedicine and the availability of in person appointments. The patient expressed understanding and agreed to proceed.   History of Present Illness: Pt is referred to therapy for PTSD (MST) and MDD by the [facility]. Her biggest stressor is her 47 yo son who has explosive aspbergers and 2 younger children (14 & 5). She lives with her "husband." Pt has fibromyalgia, which affects her ability to take care of all her family's needs.  She has previous therapy.     Observations/Objective: Patient presented for today's session on time and was alert, oriented x5, with no evidence or self-report of SI/HI or A/V H. Patient reported ongoing compliance with medication and denied any use of alcohol or illicit substances.  Clinician inquired about patient's current emotional ratings, as well as any significant changes in thoughts, feelings or behavior since previous session.  Patient's ratings: 8/10 for depression, 8/10 for anxiety, 5/10 for anger/irritability, trauma emotion rating 50% within the past week. All ratings are evidenced by self report. Cln explored trauma emotion rating with pt who identified her trauma triggers within the past week. Pt reports on her continued stressors during the past  week: husband's childhood friend's death, family issues. Pt continues to have issues with her "husband" reporting he's being mean and saying hurtful things.  Clinician utilized MI OARS to affirm her concerns. Clinician challenged her thoughts about this. Clinician processed options for communicating her concerns.  Assessment and Plan: Counselor will continue to meet with patient to address  treatment plan goals. Patient will continue to follow recommendations of providers and implement skills learned in session.     Follow Up Instructions: I discussed the assessment and treatment plan with the patient. The patient was provided an opportunity to ask questions and all were answered. The patient agreed with the plan and demonstrated an understanding of the instructions.   The patient was advised to call back or seek an in-person evaluation if the symptoms worsen or if the condition fails to improve as anticipated.   I provided 30 minutes of non-face-to-face time during this encounter.     Asante Ritacco S, LCAS

## 2021-07-17 ENCOUNTER — Ambulatory Visit (INDEPENDENT_AMBULATORY_CARE_PROVIDER_SITE_OTHER): Payer: No Typology Code available for payment source | Admitting: Licensed Clinical Social Worker

## 2021-07-17 ENCOUNTER — Other Ambulatory Visit: Payer: Self-pay

## 2021-07-17 ENCOUNTER — Encounter (HOSPITAL_COMMUNITY): Payer: Self-pay | Admitting: Licensed Clinical Social Worker

## 2021-07-17 DIAGNOSIS — F4310 Post-traumatic stress disorder, unspecified: Secondary | ICD-10-CM

## 2021-07-17 DIAGNOSIS — F331 Major depressive disorder, recurrent, moderate: Secondary | ICD-10-CM

## 2021-07-17 NOTE — Progress Notes (Signed)
Virtual Visit via Phone Note   I connected with Susan Guerra on 07/17/21 at  9:15 am EST by a phone enabled telemedicine application and verified that I am speaking with the correct person using two identifiers.   Location: Patient: home Provider: home office   I discussed the limitations of evaluation and management by telemedicine and the availability of in person appointments. The patient expressed understanding and agreed to proceed.   History of Present Illness: Pt is referred to therapy for PTSD (MST) and MDD by the [facility]. Her biggest stressor is her 46 yo son who has explosive aspbergers and 2 younger children (14 & 5). She lives with her "husband." Pt has fibromyalgia, which affects her ability to take care of all her family's needs.  She has previous therapy.     Observations/Objective: Patient presented for today's session on time and was alert, oriented x5, with no evidence or self-report of SI/HI or A/V H. Patient reported ongoing compliance with medication and denied any use of alcohol or illicit substances.  Clinician inquired about patient's current emotional ratings, as well as any significant changes in thoughts, feelings or behavior since previous session. Patient's ratings: 8/10 for depression, 8/10 for anxiety, 4/10 for anger/irritability, trauma emotion rating 50% within the past week. All ratings are evidenced by self report. Cln explored trauma emotion rating with pt who identified her trauma triggers within the past week. Pt reports on her continued stressors during the past  week: family issues. Pt  reports on her concern for her daughter's mental health. Cln and pt explored alternatives and suggestions: psychiatrist, therapist, IEP. Clinician utilized MI OARS to affirm her concerns. Pt reports "I'm overwhelmed with family issues. My father is dying in the hospital on top of everything else." Cln provided education on compartmentalization. Pt was tearful during  session.     Assessment and Plan: Counselor will continue to meet with patient to address treatment plan goals. Patient will continue to follow recommendations of providers and implement skills learned in session.     Follow Up Instructions: I discussed the assessment and treatment plan with the patient. The patient was provided an opportunity to ask questions and all were answered. The patient agreed with the plan and demonstrated an understanding of the instructions.   The patient was advised to call back or seek an in-person evaluation if the symptoms worsen or if the condition fails to improve as anticipated.   I provided 45 minutes of non-face-to-face time during this encounter.     Susan Guerra S, LCAS

## 2021-07-24 ENCOUNTER — Other Ambulatory Visit: Payer: Self-pay

## 2021-07-24 ENCOUNTER — Ambulatory Visit (INDEPENDENT_AMBULATORY_CARE_PROVIDER_SITE_OTHER): Payer: No Typology Code available for payment source | Admitting: Licensed Clinical Social Worker

## 2021-07-24 ENCOUNTER — Encounter (HOSPITAL_COMMUNITY): Payer: Self-pay | Admitting: Licensed Clinical Social Worker

## 2021-07-24 DIAGNOSIS — F331 Major depressive disorder, recurrent, moderate: Secondary | ICD-10-CM

## 2021-07-24 DIAGNOSIS — F4310 Post-traumatic stress disorder, unspecified: Secondary | ICD-10-CM | POA: Diagnosis not present

## 2021-07-24 NOTE — Progress Notes (Signed)
Virtual Visit via Phone Note   I connected with Susan Guerra on 07/24/21 at  9:15 am EST by a phone enabled telemedicine application and verified that I am speaking with the correct person using two identifiers.   Location: Patient: home Provider: home office   I discussed the limitations of evaluation and management by telemedicine and the availability of in person appointments. The patient expressed understanding and agreed to proceed.   History of Present Illness: Pt is referred to therapy for PTSD (MST) and MDD by the [facility]. Her biggest stressor is her 43 yo son who has explosive aspbergers and 2 younger children (14 & 5). She lives with her "husband." Pt has fibromyalgia, which affects her ability to take care of all her family'Guerra needs.  She has previous therapy.     Observations/Objective: Patient presented for today'Guerra session on time and was alert, oriented x5, with no evidence or self-report of SI/HI or A/V H. Patient reported ongoing compliance with medication and denied any use of alcohol or illicit substances.  Clinician inquired about patient'Guerra current emotional ratings, as well as any significant changes in thoughts, feelings or behavior since previous session. Patient'Guerra ratings: 8/10 for depression, 8/10 for anxiety, 6/10 for anger/irritability, trauma emotion rating 60% within the past week. All ratings are evidenced by self report. Cln explored trauma emotion rating with pt who identified her trauma triggers within the past week. Pt reports she and her "husband'Guerra" relationship is becoming more strained to the point that she has made a decision to leave the relationship and has made arrangements to move out after the first of the year. 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  Cln utilized CBT to address thought processes. Clinician provided thought stopping tools, as well as reality testing to provide  support and confidence in her decisions.          Assessment and Plan: Counselor will continue to meet with patient to address treatment plan goals. Patient will continue to follow recommendations of providers and implement skills learned in session.     Follow Up Instructions: I discussed the assessment and treatment plan with the patient. The patient was provided an opportunity to ask questions and all were answered. The patient agreed with the plan and demonstrated an understanding of the instructions.   The patient was advised to call back or seek an in-person evaluation if the symptoms worsen or if the condition fails to improve as anticipated.   I provided 45 minutes of non-face-to-face time during this encounter.     Susan Guerra, LCAS

## 2021-07-31 ENCOUNTER — Encounter (HOSPITAL_COMMUNITY): Payer: Self-pay | Admitting: Licensed Clinical Social Worker

## 2021-07-31 ENCOUNTER — Ambulatory Visit (INDEPENDENT_AMBULATORY_CARE_PROVIDER_SITE_OTHER): Payer: No Typology Code available for payment source | Admitting: Licensed Clinical Social Worker

## 2021-07-31 ENCOUNTER — Other Ambulatory Visit: Payer: Self-pay

## 2021-07-31 DIAGNOSIS — F4310 Post-traumatic stress disorder, unspecified: Secondary | ICD-10-CM

## 2021-07-31 DIAGNOSIS — F331 Major depressive disorder, recurrent, moderate: Secondary | ICD-10-CM

## 2021-07-31 NOTE — Progress Notes (Signed)
Virtual Visit via Phone Note   I connected with Gabryel Talamo on 07/31/21 at  9:15 am EST by a phone enabled telemedicine application and verified that I am speaking with the correct person using two identifiers.   Location: Patient: home Provider: home office   I discussed the limitations of evaluation and management by telemedicine and the availability of in person appointments. The patient expressed understanding and agreed to proceed.   History of Present Illness: Pt is referred to therapy for PTSD (MST) and MDD by the [facility]. Her biggest stressor is her 110 yo son who has explosive aspbergers and 2 younger children (14 & 5). She lives with her "husband." Pt has fibromyalgia, which affects her ability to take care of all her family's needs.  She has previous therapy.     Observations/Objective: Patient presented for todays session on time and was alert, oriented x5, with no evidence or self-report of SI/HI or A/V H. Patient reported ongoing compliance with medication and denied any use of alcohol or illicit substances.  Clinician inquired about patients current emotional ratings, as well as any significant changes in thoughts, feelings or behavior since previous session. Patient's ratings: 8/10 for depression, 8/10 for anxiety, 5/10 for anger/irritability, trauma emotion rating 60% within the past week. All ratings are evidenced by self report. Cln explored trauma emotion rating with pt who identified her trauma triggers within the past week. Pt reports on her plans for moving out of the relationship. CLn and pt explored non-negotiables in the relationship: yelling at me and calling me names, arguing in front of the children, lying and not taking responsibility for his actions.  Clinician utilized MI OARS to reflect and summarize thoughts and feelings about this new normal life she is moving towards. Clinician discussed the importance of focusing on the here and now, what she can  control.           Assessment and Plan: Counselor will continue to meet with patient to address treatment plan goals. Patient will continue to follow recommendations of providers and implement skills learned in session.     Follow Up Instructions: I discussed the assessment and treatment plan with the patient. The patient was provided an opportunity to ask questions and all were answered. The patient agreed with the plan and demonstrated an understanding of the instructions.   The patient was advised to call back or seek an in-person evaluation if the symptoms worsen or if the condition fails to improve as anticipated.   I provided 45 minutes of non-face-to-face time during this encounter.     Zollie Ellery S, LCAS

## 2021-08-07 ENCOUNTER — Ambulatory Visit (INDEPENDENT_AMBULATORY_CARE_PROVIDER_SITE_OTHER): Payer: No Typology Code available for payment source | Admitting: Licensed Clinical Social Worker

## 2021-08-07 ENCOUNTER — Other Ambulatory Visit: Payer: Self-pay

## 2021-08-07 ENCOUNTER — Encounter (HOSPITAL_COMMUNITY): Payer: Self-pay | Admitting: Licensed Clinical Social Worker

## 2021-08-07 DIAGNOSIS — F331 Major depressive disorder, recurrent, moderate: Secondary | ICD-10-CM

## 2021-08-07 DIAGNOSIS — F4310 Post-traumatic stress disorder, unspecified: Secondary | ICD-10-CM | POA: Diagnosis not present

## 2021-08-07 NOTE — Progress Notes (Signed)
Virtual Visit via Phone Note   I connected with Susan Guerra on 08/07/21 at  9:15 am EST by a phone enabled telemedicine application and verified that I am speaking with the correct person using two identifiers.   Location: Patient: home Provider: home office   I discussed the limitations of evaluation and management by telemedicine and the availability of in person appointments. The patient expressed understanding and agreed to proceed.   History of Present Illness: Pt is referred to therapy for PTSD (MST) and MDD by the [facility]. Her biggest stressor is her 43 yo son who has explosive aspbergers and 2 younger children (14 & 5). She lives with her "husband." Pt has fibromyalgia, which affects her ability to take care of all her family's needs.  She has previous therapy.     Observations/Objective: Patient presented for todays session on time and was alert, oriented x5, with no evidence or self-report of SI/HI or A/V H. Patient reported ongoing compliance with medication and denied any use of alcohol or illicit substances.  Clinician inquired about patients current emotional ratings, as well as any significant changes in thoughts, feelings or behavior since previous session. Patient's ratings: 8/10 for depression, 8/10 for anxiety, 6/10 for anger/irritability, trauma emotion rating 60% within the past week. All ratings are evidenced by self report. Cln explored trauma emotion rating with pt who identified her trauma triggers within the past week. Pt reports on her plans for moving out of the relationship. "I still plan to move out. I can't live like this." Cln validated patients feelings. Clinician utilized MI OARS to reflect and summarize thoughts and feelings about this new normal life she is moving towards. Clinician discussed the importance of focusing on the here and now, what she can control. Pt shared her holiday plans for Christmas day.            Assessment and Plan:  Counselor will continue to meet with patient to address treatment plan goals. Patient will continue to follow recommendations of providers and implement skills learned in session.     Follow Up Instructions: I discussed the assessment and treatment plan with the patient. The patient was provided an opportunity to ask questions and all were answered. The patient agreed with the plan and demonstrated an understanding of the instructions.   The patient was advised to call back or seek an in-person evaluation if the symptoms worsen or if the condition fails to improve as anticipated.   I provided 45 minutes of non-face-to-face time during this encounter.     Vikram Tillett S, LCAS

## 2021-08-14 ENCOUNTER — Other Ambulatory Visit: Payer: Self-pay

## 2021-08-14 ENCOUNTER — Ambulatory Visit (INDEPENDENT_AMBULATORY_CARE_PROVIDER_SITE_OTHER): Payer: No Typology Code available for payment source | Admitting: Licensed Clinical Social Worker

## 2021-08-14 ENCOUNTER — Encounter (HOSPITAL_COMMUNITY): Payer: Self-pay | Admitting: Licensed Clinical Social Worker

## 2021-08-14 DIAGNOSIS — F331 Major depressive disorder, recurrent, moderate: Secondary | ICD-10-CM

## 2021-08-14 DIAGNOSIS — F4310 Post-traumatic stress disorder, unspecified: Secondary | ICD-10-CM

## 2021-08-14 NOTE — Progress Notes (Signed)
Virtual Visit via Video Note   I connected with Susan Guerra on 08/14/21 at  9:15 am EST by a video enabled telemedicine application and verified that I am speaking with the correct person using two identifiers.   Location: Patient: home Provider: home office   I discussed the limitations of evaluation and management by telemedicine and the availability of in person appointments. The patient expressed understanding and agreed to proceed.   History of Present Illness: Pt is referred to therapy for PTSD (MST) and MDD by the [facility]. Her biggest stressor is her 27 yo son who has explosive aspbergers and 2 younger children (14 & 5). She lives with her "husband." Pt has fibromyalgia, which affects her ability to take care of all her family's needs.  She has previous therapy.     Observations/Objective: Patient presented for todays session on time and was alert, oriented x5, with no evidence or self-report of SI/HI or A/V H. Patient reported ongoing compliance with medication and denied any use of alcohol or illicit substances.  Clinician inquired about patients current emotional ratings, as well as any significant changes in thoughts, feelings or behavior since previous session. Patient's ratings: 6/10 for depression, 6/10 for anxiety, 4/10 for anger/irritability, trauma emotion rating 60% within the past week. All ratings are evidenced by self report. Cln explored trauma emotion rating with pt who identified her trauma triggers within the past week. Pt reports on her Christmas plans "We had a good time with our family." Pt discussed her continued plan to move out of the family home. Cln and pt explored a plan to begin the process. Clinician utilized MI OARS to reflect and summarize thoughts and feelings.           Assessment and Plan: Counselor will continue to meet with patient to address treatment plan goals. Patient will continue to follow recommendations of providers and  implement skills learned in session.     Follow Up Instructions: I discussed the assessment and treatment plan with the patient. The patient was provided an opportunity to ask questions and all were answered. The patient agreed with the plan and demonstrated an understanding of the instructions.   The patient was advised to call back or seek an in-person evaluation if the symptoms worsen or if the condition fails to improve as anticipated.   I provided 45 minutes of non-face-to-face time during this encounter.     Lexii Walsh S, LCAS

## 2021-08-21 ENCOUNTER — Other Ambulatory Visit: Payer: Self-pay

## 2021-08-21 ENCOUNTER — Encounter (HOSPITAL_COMMUNITY): Payer: Self-pay | Admitting: Licensed Clinical Social Worker

## 2021-08-21 ENCOUNTER — Ambulatory Visit (INDEPENDENT_AMBULATORY_CARE_PROVIDER_SITE_OTHER): Payer: No Typology Code available for payment source | Admitting: Licensed Clinical Social Worker

## 2021-08-21 DIAGNOSIS — F331 Major depressive disorder, recurrent, moderate: Secondary | ICD-10-CM

## 2021-08-21 DIAGNOSIS — F4310 Post-traumatic stress disorder, unspecified: Secondary | ICD-10-CM | POA: Diagnosis not present

## 2021-08-21 NOTE — Progress Notes (Signed)
Virtual Visit via Video Note   I connected with Susan Guerra on 08/21/21 at  9:15 am EST by a video enabled telemedicine application and verified that I am speaking with the correct person using two identifiers.   Location: Patient: home Provider: home office   I discussed the limitations of evaluation and management by telemedicine and the availability of in person appointments. The patient expressed understanding and agreed to proceed.   History of Present Illness: Pt is referred to therapy for PTSD (MST) and MDD by the [facility]. Her biggest stressor is her 50 yo son who has explosive aspbergers and 2 younger children (14 & 5). She lives with her "husband." Pt has fibromyalgia, which affects her ability to take care of all her family's needs.  She has previous therapy.     Observations/Objective: Patient presented for todays session on time and was alert, oriented x5, with no evidence or self-report of SI/HI or A/V H. Patient reported ongoing compliance with medication and denied any use of alcohol or illicit substances.  Clinician inquired about patients current emotional ratings, as well as any significant changes in thoughts, feelings or behavior since previous session. Patient's ratings: 6/10 for depression, 6/10 for anxiety, 3/10 for anger/irritability, trauma emotion rating 60% within the past week. All ratings are evidenced by self report. Cln explored trauma emotion rating with pt who identified her trauma triggers within the past week. And coping skills used. Pt reports on her holidays. Pt discussed her continued plan to move out of the family home. "I had a good talk with my "husband" and we talked about a plan of separation. He seemed pretty condescending to me." Cln used CBT to assist patient in observing her own thoughts, feelings and behaviors that my play into discussions/communication. Cln provided education on effective communication skills.            Assessment  and Plan: Counselor will continue to meet with patient to address treatment plan goals. Patient will continue to follow recommendations of providers and implement skills learned in session.     Follow Up Instructions: I discussed the assessment and treatment plan with the patient. The patient was provided an opportunity to ask questions and all were answered. The patient agreed with the plan and demonstrated an understanding of the instructions.   The patient was advised to call back or seek an in-person evaluation if the symptoms worsen or if the condition fails to improve as anticipated.   I provided 45 minutes of non-face-to-face time during this encounter.     Braxden Lovering S, LCAS

## 2021-08-28 ENCOUNTER — Other Ambulatory Visit: Payer: Self-pay

## 2021-08-28 ENCOUNTER — Ambulatory Visit (HOSPITAL_COMMUNITY): Payer: No Typology Code available for payment source | Admitting: Licensed Clinical Social Worker

## 2021-08-28 ENCOUNTER — Telehealth (HOSPITAL_COMMUNITY): Payer: Self-pay | Admitting: Licensed Clinical Social Worker

## 2021-08-28 NOTE — Telephone Encounter (Signed)
Pt did not present for appointment. CLn called and texted.   Alver Fisher, LCAS

## 2021-08-29 ENCOUNTER — Ambulatory Visit (INDEPENDENT_AMBULATORY_CARE_PROVIDER_SITE_OTHER): Payer: No Typology Code available for payment source | Admitting: Licensed Clinical Social Worker

## 2021-08-29 ENCOUNTER — Encounter (HOSPITAL_COMMUNITY): Payer: Self-pay | Admitting: Licensed Clinical Social Worker

## 2021-08-29 DIAGNOSIS — F331 Major depressive disorder, recurrent, moderate: Secondary | ICD-10-CM | POA: Diagnosis not present

## 2021-08-29 DIAGNOSIS — F4310 Post-traumatic stress disorder, unspecified: Secondary | ICD-10-CM | POA: Diagnosis not present

## 2021-08-29 NOTE — Progress Notes (Signed)
Virtual Visit via Video Note   I connected with Susan Guerra on 08/29/21 at  9:15 am EST by a video enabled telemedicine application and verified that I am speaking with the correct person using two identifiers.   Location: Patient: home Provider: home office   I discussed the limitations of evaluation and management by telemedicine and the availability of in person appointments. The patient expressed understanding and agreed to proceed.   History of Present Illness: Pt is referred to therapy for PTSD (MST) and MDD by the [facility]. Her biggest stressor is her 63 yo son who has explosive aspbergers and 2 younger children (14 & 5). She lives with her "husband." Pt has fibromyalgia, which affects her ability to take care of all her family's needs.  She has previous therapy.     Observations/Objective: Patient presented for todays session on time and was alert, oriented x5, with no evidence or self-report of SI/HI or A/V H. Patient reported ongoing compliance with medication and denied any use of alcohol or illicit substances.  Clinician inquired about patients current emotional ratings, as well as any significant changes in thoughts, feelings or behavior since previous session. Patient's ratings: 6/10 for depression, 6/10 for anxiety, 3/10 for anger/irritability, trauma emotion rating 60% within the past week. All ratings are evidenced by self report. Cln explored trauma emotion rating with pt who identified her trauma triggers within the past week and coping skills used.. Pt discussed her continued plan to move out of the family home. Clinician utilized CBT to process thoughts, feelings, and behaviors. Clinician provided supportive feedback.              Assessment and Plan: Counselor will continue to meet with patient to address treatment plan goals. Patient will continue to follow recommendations of providers and implement skills learned in session.     Follow Up  Instructions: I discussed the assessment and treatment plan with the patient. The patient was provided an opportunity to ask questions and all were answered. The patient agreed with the plan and demonstrated an understanding of the instructions.   The patient was advised to call back or seek an in-person evaluation if the symptoms worsen or if the condition fails to improve as anticipated.   I provided 30 minutes of non-face-to-face time during this encounter.     Vercie Pokorny S, LCAS

## 2021-09-04 ENCOUNTER — Ambulatory Visit (HOSPITAL_COMMUNITY): Payer: No Typology Code available for payment source | Admitting: Licensed Clinical Social Worker

## 2021-09-04 ENCOUNTER — Other Ambulatory Visit: Payer: Self-pay

## 2021-09-04 ENCOUNTER — Ambulatory Visit (INDEPENDENT_AMBULATORY_CARE_PROVIDER_SITE_OTHER): Payer: No Typology Code available for payment source | Admitting: Licensed Clinical Social Worker

## 2021-09-04 ENCOUNTER — Encounter (HOSPITAL_COMMUNITY): Payer: Self-pay | Admitting: Licensed Clinical Social Worker

## 2021-09-04 DIAGNOSIS — F331 Major depressive disorder, recurrent, moderate: Secondary | ICD-10-CM

## 2021-09-04 DIAGNOSIS — F4310 Post-traumatic stress disorder, unspecified: Secondary | ICD-10-CM

## 2021-09-04 NOTE — Progress Notes (Signed)
Virtual Visit via Video Note   I connected with Susan Guerra on 09/04/21 at  9:15 am EST by a video enabled telemedicine application and verified that I am speaking with the correct person using two identifiers.   Location: Patient: home Provider: home office   I discussed the limitations of evaluation and management by telemedicine and the availability of in person appointments. The patient expressed understanding and agreed to proceed.   History of Present Illness: Pt is referred to therapy for PTSD (MST) and MDD by the [facility]. Her biggest stressor is her 50 yo son who has explosive aspbergers and 2 younger children (51 & 5). She lives with her "husband." Pt has fibromyalgia, which affects her ability to take care of all her family's needs.  She has previous therapy.     Observations/Objective: Patient presented for todays session on time and was alert, oriented x5, with no evidence or self-report of SI/HI or A/V H. Patient reported ongoing compliance with medication and denied any use of alcohol or illicit substances.  Clinician inquired about patients current emotional ratings, as well as any significant changes in thoughts, feelings or behavior since previous session. Patient's ratings: 6/10 for depression, 6/10 for anxiety, 3/10 for anger/irritability, trauma emotion rating 60% within the past week. All ratings are evidenced by self report. Cln explored trauma emotional rating with pt who identified her trauma triggers within the past week and coping skills used.Pt discussed the problems her youngest son is experiencing at school. Cln and pt explored problem-solving: bringing the school counselor, principal and teacher in to address problem behaviors. "I feel like I'm a bad parent." Clinician utilized CBT to process thoughts, feelings, and behaviors. Clinician provided supportive feedback.              Assessment and Plan: Counselor will continue to meet with patient to  address treatment plan goals. Patient will continue to follow recommendations of providers and implement skills learned in session.     Follow Up Instructions: I discussed the assessment and treatment plan with the patient. The patient was provided an opportunity to ask questions and all were answered. The patient agreed with the plan and demonstrated an understanding of the instructions.   The patient was advised to call back or seek an in-person evaluation if the symptoms worsen or if the condition fails to improve as anticipated.   I provided 45 minutes of non-face-to-face time during this encounter.     Syndi Pua S, LCAS

## 2021-09-11 ENCOUNTER — Other Ambulatory Visit: Payer: Self-pay

## 2021-09-11 ENCOUNTER — Ambulatory Visit (HOSPITAL_COMMUNITY): Payer: No Typology Code available for payment source | Admitting: Licensed Clinical Social Worker

## 2021-09-11 ENCOUNTER — Ambulatory Visit (INDEPENDENT_AMBULATORY_CARE_PROVIDER_SITE_OTHER): Payer: No Typology Code available for payment source | Admitting: Licensed Clinical Social Worker

## 2021-09-11 ENCOUNTER — Encounter (HOSPITAL_COMMUNITY): Payer: Self-pay | Admitting: Licensed Clinical Social Worker

## 2021-09-11 DIAGNOSIS — F331 Major depressive disorder, recurrent, moderate: Secondary | ICD-10-CM

## 2021-09-11 DIAGNOSIS — F4310 Post-traumatic stress disorder, unspecified: Secondary | ICD-10-CM | POA: Diagnosis not present

## 2021-09-11 NOTE — Progress Notes (Signed)
Virtual Visit via Video Note   I connected with Susan Guerra on 09/11/21 at  9:15 am EST by a video enabled telemedicine application and verified that I am speaking with the correct person using two identifiers.   Location: Patient: home Provider: home office   I discussed the limitations of evaluation and management by telemedicine and the availability of in person appointments. The patient expressed understanding and agreed to proceed.   History of Present Illness: Pt is referred to therapy for PTSD (MST) and MDD by the [facility]. Her biggest stressor is her 70 yo son who has explosive aspbergers and 2 younger children (14 & 5). She lives with her "husband." Pt has fibromyalgia, which affects her ability to take care of all her family's needs.  She has previous therapy.     Observations/Objective: Patient presented for todays session on time and was alert, oriented x5, with no evidence or self-report of SI/HI or A/V H. Patient reported ongoing compliance with medication and denied any use of alcohol or illicit substances.  Clinician inquired about patients current emotional ratings, as well as any significant changes in thoughts, feelings or behavior since previous session. Patient's ratings: 6/10 for depression, 6/10 for anxiety, 3/10 for anger/irritability, trauma emotion rating 60% within the past week. All ratings are evidenced by self report. Cln explored trauma emotional rating with pt who identified her trauma triggers within the past week and coping skills used. Pt reports her biggest stressor this week: her daughter's father who continues to live with her older son.They are in the process of evicting him. "This is very stressful to me and my whole family." Cln provided education on mindful-based coping skills. Pt provided an update on "moving out of the family home, separation." Cln and pt role-played talking with "husband" problem-solving differences, coming up with a plan to  separate their family.       Assessment and Plan: Counselor will continue to meet with patient to address treatment plan goals. Patient will continue to follow recommendations of providers and implement skills learned in session.     Follow Up Instructions: I discussed the assessment and treatment plan with the patient. The patient was provided an opportunity to ask questions and all were answered. The patient agreed with the plan and demonstrated an understanding of the instructions.   The patient was advised to call back or seek an in-person evaluation if the symptoms worsen or if the condition fails to improve as anticipated.   I provided 45 minutes of non-face-to-face time during this encounter.     Hadrian Yarbrough S, LCAS

## 2021-09-18 ENCOUNTER — Other Ambulatory Visit: Payer: Self-pay

## 2021-09-18 ENCOUNTER — Ambulatory Visit (HOSPITAL_COMMUNITY): Payer: No Typology Code available for payment source | Admitting: Licensed Clinical Social Worker

## 2021-09-18 ENCOUNTER — Encounter (HOSPITAL_COMMUNITY): Payer: Self-pay | Admitting: Licensed Clinical Social Worker

## 2021-09-18 ENCOUNTER — Ambulatory Visit (INDEPENDENT_AMBULATORY_CARE_PROVIDER_SITE_OTHER): Payer: No Typology Code available for payment source | Admitting: Licensed Clinical Social Worker

## 2021-09-18 DIAGNOSIS — F331 Major depressive disorder, recurrent, moderate: Secondary | ICD-10-CM | POA: Diagnosis not present

## 2021-09-18 DIAGNOSIS — F4310 Post-traumatic stress disorder, unspecified: Secondary | ICD-10-CM | POA: Diagnosis not present

## 2021-09-18 NOTE — Progress Notes (Signed)
Virtual Visit via Video Note   I connected with Susan Guerra on 09/18/21 at  9:15 am EST by a video enabled telemedicine application and verified that I am speaking with the correct person using two identifiers.   Location: Patient: home Provider: home office   I discussed the limitations of evaluation and management by telemedicine and the availability of in person appointments. The patient expressed understanding and agreed to proceed.   History of Present Illness: Pt is referred to therapy for PTSD (MST) and MDD by the [facility]. Her biggest stressor is her 44 yo son who has explosive aspbergers and 2 younger children (14 & 5). She lives with her "husband." Pt has fibromyalgia, which affects her ability to take care of all her family's needs.  She has previous therapy.     Observations/Objective: Patient presented for todays session on time and was alert, oriented x5, with no evidence or self-report of SI/HI or A/V H. Patient reported ongoing compliance with medication and denied any use of alcohol or illicit substances.  Clinician inquired about patients current emotional ratings, as well as any significant changes in thoughts, feelings or behavior since previous session. Patient's ratings: 6/10 for depression, 6/10 for anxiety, 3/10 for anger/irritability, trauma emotional rating 60% within the past week. All ratings are evidenced by self report. Cln explored trauma emotional rating with pt who identified her trauma triggers within the past week and coping skills used. Pt provided an update on "moving out of the family home, separation. I began the conversation with my "husband." Cln and pt again, role-played the next step of conversation, problem-solving.        Assessment and Plan: Counselor will continue to meet with patient to address treatment plan goals. Patient will continue to follow recommendations of providers and implement skills learned in session.     Follow Up  Instructions: I discussed the assessment and treatment plan with the patient. The patient was provided an opportunity to ask questions and all were answered. The patient agreed with the plan and demonstrated an understanding of the instructions.   The patient was advised to call back or seek an in-person evaluation if the symptoms worsen or if the condition fails to improve as anticipated.   I provided 30 minutes of non-face-to-face time during this encounter.     Brittnye Josephs S, LCAS

## 2021-09-25 ENCOUNTER — Other Ambulatory Visit: Payer: Self-pay

## 2021-09-25 ENCOUNTER — Encounter (HOSPITAL_COMMUNITY): Payer: Self-pay | Admitting: Licensed Clinical Social Worker

## 2021-09-25 ENCOUNTER — Ambulatory Visit (INDEPENDENT_AMBULATORY_CARE_PROVIDER_SITE_OTHER): Payer: No Typology Code available for payment source | Admitting: Licensed Clinical Social Worker

## 2021-09-25 DIAGNOSIS — F331 Major depressive disorder, recurrent, moderate: Secondary | ICD-10-CM

## 2021-09-25 DIAGNOSIS — F4310 Post-traumatic stress disorder, unspecified: Secondary | ICD-10-CM | POA: Diagnosis not present

## 2021-09-25 NOTE — Progress Notes (Signed)
Virtual Visit via Video Note   I connected with Susan Guerra on 09/25/21 at  9:15 am EST by a video enabled telemedicine application and verified that I am speaking with the correct person using two identifiers.   Location: Patient: home Provider: home office   I discussed the limitations of evaluation and management by telemedicine and the availability of in person appointments. The patient expressed understanding and agreed to proceed.   History of Present Illness: Pt is referred to therapy for PTSD (MST) and MDD by the [facility]. Her biggest stressor is her 44 yo son who has explosive aspbergers and 2 younger children (14 & 5). She lives with her "husband." Pt has fibromyalgia, which affects her ability to take care of all her family's needs.  She has previous therapy.     Observations/Objective: Patient presented for todays session on time and was alert, oriented x5, with no evidence or self-report of SI/HI or A/V H. Patient reported ongoing compliance with medication and denied any use of alcohol or illicit substances.  Clinician inquired about patients current emotional ratings, as well as any significant changes in thoughts, feelings or behavior since previous session. Patient's ratings: 6/10 for depression, 6/10 for anxiety, 3/10 for anger/irritability, trauma emotional rating 60% within the past week. All ratings are evidenced by self report. Cln explored trauma emotional rating with pt who identified her trauma triggers within the past week and coping skills used. Pt reports "my "husband" moved out this morning." Cln asked open-ended questions. Clinician utilized MI OARS to reflect and summarize thoughts and feelings about this new normal life she is currently experiencing. Clinician discussed the importance of focusing on the here and now, what she can control. Cln and pt role-played how to tell the children today. Clinician utilized MI OARS to reflect and summarize thoughts and  feelings.         Assessment and Plan: Counselor will continue to meet with patient to address treatment plan goals. Patient will continue to follow recommendations of providers and implement skills learned in session.     Follow Up Instructions: I discussed the assessment and treatment plan with the patient. The patient was provided an opportunity to ask questions and all were answered. The patient agreed with the plan and demonstrated an understanding of the instructions.   The patient was advised to call back or seek an in-person evaluation if the symptoms worsen or if the condition fails to improve as anticipated.   I provided 45 minutes of non-face-to-face time during this encounter.     Zyniah Ferraiolo S, LCAS

## 2021-10-02 ENCOUNTER — Other Ambulatory Visit: Payer: Self-pay

## 2021-10-02 ENCOUNTER — Encounter (HOSPITAL_COMMUNITY): Payer: Self-pay | Admitting: Licensed Clinical Social Worker

## 2021-10-02 ENCOUNTER — Ambulatory Visit (INDEPENDENT_AMBULATORY_CARE_PROVIDER_SITE_OTHER): Payer: No Typology Code available for payment source | Admitting: Licensed Clinical Social Worker

## 2021-10-02 DIAGNOSIS — F4310 Post-traumatic stress disorder, unspecified: Secondary | ICD-10-CM

## 2021-10-02 DIAGNOSIS — F331 Major depressive disorder, recurrent, moderate: Secondary | ICD-10-CM | POA: Diagnosis not present

## 2021-10-02 NOTE — Progress Notes (Signed)
Virtual Visit via Video Note   I connected with Susan Guerra on 10/02/21 at  9:15 am EST by a video enabled telemedicine application and verified that I am speaking with the correct person using two identifiers.   Location: Patient: home Provider: home office   I discussed the limitations of evaluation and management by telemedicine and the availability of in person appointments. The patient expressed understanding and agreed to proceed.   History of Present Illness: Pt is referred to therapy for PTSD (MST) and MDD by the [facility]. Her biggest stressor is her 88 yo son who has explosive aspbergers and 2 younger children (14 & 5). She lives with her "husband." Pt has fibromyalgia, which affects her ability to take care of all her family's needs.  She has previous therapy.     Observations/Objective: Patient presented for todays session on time and was alert, oriented x5, with no evidence or self-report of SI/HI or A/V H. Patient reported ongoing compliance with medication and denied any use of alcohol or illicit substances.  Clinician inquired about patients current emotional ratings, as well as any significant changes in thoughts, feelings or behavior since previous session. Patient's ratings: 6/10 for depression, 6/10 for anxiety, 5/10 for anger/irritability, trauma emotional rating 60% within the past week. All ratings are evidenced by self report. Cln explored trauma emotional rating with pt who identified her trauma triggers within the past week and coping skills used. Pt reports on her life after her separation from "my "husband." "It has been difficult time. My emotional ratings have increased." Cln asked open ended questions.  Cln provided education on mindfulness-based stress response to assist patient in lowering her stress response. Cln  encouraged pt to use mindfulness skills as a coping skill.   PLAN: TMS and pain therapist     Assessment and Plan: Counselor will continue  to meet with patient to address treatment plan goals. Patient will continue to follow recommendations of providers and implement skills learned in session.     Follow Up Instructions: I discussed the assessment and treatment plan with the patient. The patient was provided an opportunity to ask questions and all were answered. The patient agreed with the plan and demonstrated an understanding of the instructions.   The patient was advised to call back or seek an in-person evaluation if the symptoms worsen or if the condition fails to improve as anticipated.   I provided 45 minutes of non-face-to-face time during this encounter.     [name] S, LCAS

## 2021-10-09 ENCOUNTER — Ambulatory Visit (INDEPENDENT_AMBULATORY_CARE_PROVIDER_SITE_OTHER): Payer: No Typology Code available for payment source | Admitting: Licensed Clinical Social Worker

## 2021-10-09 ENCOUNTER — Encounter (HOSPITAL_COMMUNITY): Payer: Self-pay | Admitting: Licensed Clinical Social Worker

## 2021-10-09 ENCOUNTER — Other Ambulatory Visit: Payer: Self-pay

## 2021-10-09 DIAGNOSIS — F4310 Post-traumatic stress disorder, unspecified: Secondary | ICD-10-CM

## 2021-10-09 DIAGNOSIS — F331 Major depressive disorder, recurrent, moderate: Secondary | ICD-10-CM | POA: Diagnosis not present

## 2021-10-09 NOTE — Progress Notes (Signed)
Virtual Visit via Phone Note   I connected with Susan Guerra on 10/09/21 at  9:15 am EST by a phone enabled telemedicine application and verified that I am speaking with the correct person using two identifiers.   Location: Patient: home Provider: home office   I discussed the limitations of evaluation and management by telemedicine and the availability of in person appointments. The patient expressed understanding and agreed to proceed.   History of Present Illness: Pt is referred to therapy for PTSD (MST) and MDD by the [facility]. Her biggest stressor is her 44 yo son who has explosive aspbergers and 2 younger children (14 & 5). She lives with her "husband." Pt has fibromyalgia, which affects her ability to take care of all her family's needs.  She has previous therapy.     Observations/Objective: Patient presented for todays session on time and was alert, oriented x5, with no evidence or self-report of SI/HI or A/V H. Patient reported ongoing compliance with medication and denied any use of alcohol or illicit substances.  Clinician inquired about patients current emotional ratings, as well as any significant changes in thoughts, feelings or behavior since previous session. Patient's ratings: 8/10 for depression, 8/10 for anxiety, 6/10 for anger/irritability, trauma emotional rating 60% within the past week. All ratings are evidenced by self report. Cln explored trauma emotional rating with pt who identified her trauma triggers within the past week and coping skills used. Pt tearfully reports on her life after her separation from "my "husband." "It has been difficult time. My emotional ratings have increased." Pt is making irrational  Decisions about her life and then experiencing negative consequences which is affecting her emotional ratings. Clinician utilized CBT to address thought processes. Clinician provided thought stopping tools, as well as reality testing to provide support and  confidence in her decisions. CLn reviewed tx plan with pt who verbally accepted the updated tx plan. Cln emailed pt link: [name]: "We can do the hard things."  Collaboration of Care: Other: No collaboration of care was needed at this session.  Patient/Guardian was advised Release of Information must be obtained prior to any record release in order to collaborate their care with an outside provider. Patient/Guardian was advised if they have not already done so to contact the registration department to sign all necessary forms in order for [place] to release information regarding their care.   Consent: Patient/Guardian gives verbal consent for treatment and assignment of benefits for services provided during this visit. Patient/Guardian expressed understanding and agreed to proceed.    PLAN: TMS and pain therapist     Assessment and Plan: Counselor will continue to meet with patient to address treatment plan goals. Patient will continue to follow recommendations of providers and implement skills learned in session.     Follow Up Instructions: I discussed the assessment and treatment plan with the patient. The patient was provided an opportunity to ask questions and all were answered. The patient agreed with the plan and demonstrated an understanding of the instructions.   The patient was advised to call back or seek an in-person evaluation if the symptoms worsen or if the condition fails to improve as anticipated.   I provided 45 minutes of non-face-to-face time during this encounter.     Dorothe Elmore S, LCAS

## 2021-10-16 ENCOUNTER — Encounter (HOSPITAL_COMMUNITY): Payer: Self-pay | Admitting: Licensed Clinical Social Worker

## 2021-10-16 ENCOUNTER — Other Ambulatory Visit: Payer: Self-pay

## 2021-10-16 ENCOUNTER — Ambulatory Visit (INDEPENDENT_AMBULATORY_CARE_PROVIDER_SITE_OTHER): Payer: No Typology Code available for payment source | Admitting: Licensed Clinical Social Worker

## 2021-10-16 DIAGNOSIS — F331 Major depressive disorder, recurrent, moderate: Secondary | ICD-10-CM | POA: Diagnosis not present

## 2021-10-16 DIAGNOSIS — F4310 Post-traumatic stress disorder, unspecified: Secondary | ICD-10-CM | POA: Diagnosis not present

## 2021-10-16 NOTE — Progress Notes (Signed)
Virtual Visit via Phone Note ?  ?I connected with Susan Guerra on 10/16/21 at  9:15 am EST by a phone enabled telemedicine application and verified that I am speaking with the correct person using two identifiers. ?  ?Location: ?Patient: sister's house ?Provider: home office ?  ?I discussed the limitations of evaluation and management by telemedicine and the availability of in person appointments. The patient expressed understanding and agreed to proceed. ?  ?History of Present Illness: Pt is referred to therapy for PTSD (MST) and MDD by the [facility]. Her biggest stressor is her 6 yo son who has explosive aspbergers and 2 younger children (14 & 5). She lives with her "husband." Pt has fibromyalgia, which affects her ability to take care of all her family's needs.  She has previous therapy. ?  ?  ?Observations/Objective: Patient presented for today?s session on time and was alert, oriented x5, with no evidence or self-report of SI/HI or A/V H. Patient reported ongoing compliance with medication and denied any use of alcohol or illicit substances.  Clinician inquired about patient?s current emotional ratings, as well as any significant changes in thoughts, feelings or behavior since previous session. Patient's ratings: 8/10 for depression, 8/10 for anxiety, 4/10 for anger/irritability, trauma emotional rating 60% within the past week. All ratings are evidenced by self report. Cln explored trauma emotional rating with pt who identified her trauma triggers within the past week and coping skills used. Pt tearfully reports "My mother died unexpectedly 2022/12/28.I'm in [place] at my sister's house." ?Cln provided education on the stages of grief. Cln provided a safe platform to talk about her mother, her grief, her feelings, her siblings. ? ? ? ? ? ?Plan: [name]: "We can do the hard things." ? ?Collaboration of Care: Other: No collaboration of care was needed at this session. ? ?Patient/Guardian was  advised Release of Information must be obtained prior to any record release in order to collaborate their care with an outside provider. Patient/Guardian was advised if they have not already done so to contact the registration department to sign all necessary forms in order for [place] to release information regarding their care.  ? ?Consent: Patient/Guardian gives verbal consent for treatment and assignment of benefits for services provided during this visit. Patient/Guardian expressed understanding and agreed to proceed.  ? ? ?PLAN: TMS and pain therapist ? ? ? ? ?Assessment and Plan: Counselor will continue to meet with patient to address treatment plan goals. Patient will continue to follow recommendations of providers and implement skills learned in session. ?  ?  ?Follow Up Instructions: I discussed the assessment and treatment plan with the patient. The patient was provided an opportunity to ask questions and all were answered. The patient agreed with the plan and demonstrated an understanding of the instructions. ?  ?The patient was advised to call back or seek an in-person evaluation if the symptoms worsen or if the condition fails to improve as anticipated. ?  ?I provided 45 minutes of non-face-to-face time during this encounter. ?  ?  ?[name] S, LCAS ?   ?  ? ? ? ? ? ? ?

## 2021-10-23 ENCOUNTER — Ambulatory Visit (INDEPENDENT_AMBULATORY_CARE_PROVIDER_SITE_OTHER): Payer: No Typology Code available for payment source | Admitting: Licensed Clinical Social Worker

## 2021-10-23 ENCOUNTER — Other Ambulatory Visit: Payer: Self-pay

## 2021-10-23 DIAGNOSIS — F4310 Post-traumatic stress disorder, unspecified: Secondary | ICD-10-CM

## 2021-10-23 DIAGNOSIS — F331 Major depressive disorder, recurrent, moderate: Secondary | ICD-10-CM

## 2021-10-29 ENCOUNTER — Encounter (HOSPITAL_COMMUNITY): Payer: Self-pay | Admitting: Licensed Clinical Social Worker

## 2021-10-29 NOTE — Progress Notes (Signed)
Virtual Visit via Phone Note ?  ?I connected with Susan Guerra on 10/23/21 at  9:15 am EST by a phone enabled telemedicine application and verified that I am speaking with the correct person using two identifiers. ?  ?Location: ?Patient: home ?Provider: home office ?  ?I discussed the limitations of evaluation and management by telemedicine and the availability of in person appointments. The patient expressed understanding and agreed to proceed. ?  ?History of Present Illness: Pt is referred to therapy for PTSD (MST) and MDD by the [facility]. Her biggest stressor is her 70 yo son who has explosive aspbergers and 2 younger children (14 & 5). She lives with her "husband." Pt has fibromyalgia, which affects her ability to take care of all her family's needs.  She has previous therapy. ? ?Treatment Goal Addressed: ?Pt will recall the traumatic event without becoming too overwhelmed with emotions 50% of the time, evidenced by self report. ? ?Progression towards Goal: ?Progressing ?  ?  ?Observations/Objective: Patient presented for today?s session on time and was alert, oriented x5, with no evidence or self-report of SI/HI or A/V H. Patient reported ongoing compliance with medication and denied any use of alcohol or illicit substances.  Clinician inquired about patient?s current emotional ratings, as well as any significant changes in thoughts, feelings or behavior since previous session. Patient's ratings: 8/10 for depression, 8/10 for anxiety, 4/10 for anger/irritability, trauma emotional rating 60% within the past week. All ratings are evidenced by self report. Cln explored trauma emotional rating with pt who identified her trauma triggers within the past week and coping skills used. Pt tearfully reports on her mothers death, service, family, coping. Cln explores with pt the stage of grief she is experiencing: denial and anger. Cln provided coping skills for anger due to unexpected death. Cln gave pt  [facility]. ? ? ? ? ?Plan: [name]: "We can do the hard things." ? ?Collaboration of Care: Other:  [facility]ing [facility] ? ?Patient/Guardian was advised Release of Information must be obtained prior to any record release in order to collaborate their care with an outside provider. Patient/Guardian was advised if they have not already done so to contact the registration department to sign all necessary forms in order for [place] to release information regarding their care.  ? ?Consent: Patient/Guardian gives verbal consent for treatment and assignment of benefits for services provided during this visit. Patient/Guardian expressed understanding and agreed to proceed.  ? ? ?PLAN: TMS and pain therapist ? ? ? ? ?Assessment and Plan: Counselor will continue to meet with patient to address treatment plan goals. Patient will continue to follow recommendations of providers and implement skills learned in session. ?  ?  ?Follow Up Instructions: I discussed the assessment and treatment plan with the patient. The patient was provided an opportunity to ask questions and all were answered. The patient agreed with the plan and demonstrated an understanding of the instructions. ?  ?The patient was advised to call back or seek an in-person evaluation if the symptoms worsen or if the condition fails to improve as anticipated. ?  ?I provided 45 minutes of non-face-to-face time during this encounter. ?  ?  ?Salvadore Valvano S, LCAS ?   ?  ? ? ? ? ? ? ?

## 2021-10-30 ENCOUNTER — Other Ambulatory Visit: Payer: Self-pay

## 2021-10-30 ENCOUNTER — Ambulatory Visit (INDEPENDENT_AMBULATORY_CARE_PROVIDER_SITE_OTHER): Payer: No Typology Code available for payment source | Admitting: Licensed Clinical Social Worker

## 2021-10-30 ENCOUNTER — Ambulatory Visit (HOSPITAL_COMMUNITY): Payer: No Typology Code available for payment source | Admitting: Licensed Clinical Social Worker

## 2021-10-30 DIAGNOSIS — F331 Major depressive disorder, recurrent, moderate: Secondary | ICD-10-CM

## 2021-10-30 DIAGNOSIS — F4310 Post-traumatic stress disorder, unspecified: Secondary | ICD-10-CM

## 2021-11-04 ENCOUNTER — Encounter (HOSPITAL_COMMUNITY): Payer: Self-pay | Admitting: Licensed Clinical Social Worker

## 2021-11-04 NOTE — Progress Notes (Signed)
Virtual Visit via Phone Note ?  ?I connected with Susan Guerra on 10/30/21 at  4:00pm by a phone enabled telemedicine application and verified that I am speaking with the correct person using two identifiers. ?  ?Location: ?Patient: home ?Provider: home office ?  ?I discussed the limitations of evaluation and management by telemedicine and the availability of in person appointments. The patient expressed understanding and agreed to proceed. ?  ?History of Present Illness: Pt is referred to therapy for PTSD (MST) and MDD by the [facility]. Her biggest stressor is her 59 yo son who has explosive aspbergers and 2 younger children (14 & 5). She lives with her "husband." Pt has fibromyalgia, which affects her ability to take care of all her family's needs.  She has previous therapy. ? ?Treatment Goal Addressed: ?Pt will recall the traumatic event without becoming too overwhelmed with emotions 50% of the time, evidenced by self report. ? ?Progression towards Goal: ?Progressing ?  ?  ?Observations/Objective: Patient presented for today?s session on time and was alert, oriented x5, with no evidence or self-report of SI/HI or A/V H. Patient reported ongoing compliance with medication and denied any use of alcohol or illicit substances.  Clinician inquired about patient?s current emotional ratings, as well as any significant changes in thoughts, feelings or behavior since previous session. Patient's ratings: 8/10 for depression, 8/10 for anxiety, 4/10 for anger/irritability, trauma emotional rating 60% within the past week. All ratings are evidenced by self report. Cln explored trauma emotional rating with pt who identified her trauma triggers within the past week and coping skills used. Pt tearfully reports on her stage of grief. "I'm still experiencing: denial and anger, stage of grief." Cln and pt explored coping skills for anger due to unexpected death. Cln reviewed anger coping skills after reviewing anger coping  skills worksheet. ? ? ? ? ?Plan: [name]: "We can do the hard things." ? ?Collaboration of Care: Other:  [facility] ? ?Patient/Guardian was advised Release of Information must be obtained prior to any record release in order to collaborate their care with an outside provider. Patient/Guardian was advised if they have not already done so to contact the registration department to sign all necessary forms in order for [place] to release information regarding their care.  ? ?Consent: Patient/Guardian gives verbal consent for treatment and assignment of benefits for services provided during this visit. Patient/Guardian expressed understanding and agreed to proceed.  ? ? ?PLAN: TMS and pain therapist ? ? ? ? ?Assessment and Plan: Counselor will continue to meet with patient to address treatment plan goals. Patient will continue to follow recommendations of providers and implement skills learned in session. ?  ?  ?Follow Up Instructions: I discussed the assessment and treatment plan with the patient. The patient was provided an opportunity to ask questions and all were answered. The patient agreed with the plan and demonstrated an understanding of the instructions. ?  ?The patient was advised to call back or seek an in-person evaluation if the symptoms worsen or if the condition fails to improve as anticipated. ?  ?I provided 60 minutes of non-face-to-face time during this encounter. ?  ?  ?Jamonica Schoff S, LCAS ?   ?  ? ? ? ? ? ? ?

## 2021-11-06 ENCOUNTER — Other Ambulatory Visit: Payer: Self-pay

## 2021-11-06 ENCOUNTER — Encounter (HOSPITAL_COMMUNITY): Payer: Self-pay | Admitting: Licensed Clinical Social Worker

## 2021-11-06 ENCOUNTER — Ambulatory Visit (INDEPENDENT_AMBULATORY_CARE_PROVIDER_SITE_OTHER): Payer: No Typology Code available for payment source | Admitting: Licensed Clinical Social Worker

## 2021-11-06 DIAGNOSIS — F331 Major depressive disorder, recurrent, moderate: Secondary | ICD-10-CM

## 2021-11-06 DIAGNOSIS — F4310 Post-traumatic stress disorder, unspecified: Secondary | ICD-10-CM | POA: Diagnosis not present

## 2021-11-06 NOTE — Progress Notes (Signed)
Virtual Visit via video Note ?  ?I connected with Susan Guerra on 11/06/21 at 9:15am by video enabled telemedicine application and verified that I am speaking with the correct person using two identifiers. ?  ?Location: ?Patient: home ?Provider: home office ?  ?I discussed the limitations of evaluation and management by telemedicine and the availability of in person appointments. The patient expressed understanding and agreed to proceed. ?  ?History of Present Illness: Pt is referred to therapy for PTSD (MST) and MDD by the [facility]. Her biggest stressor is her 58 yo son who has explosive aspbergers and 2 younger children (51 & 5). She lives with her "husband." Pt has fibromyalgia, which affects her ability to take care of all her family's needs.  She has previous therapy. ? ?Treatment Goal Addressed: ?Pt will recall the traumatic event without becoming too overwhelmed with emotions 50% of the time, evidenced by self report. ? ?Progression towards Goal: ?Progressing ?  ?  ?Observations/Objective: Patient presented for today?s session on time and was alert, oriented x5, with no evidence or self-report of SI/HI or A/V H. Patient reported ongoing compliance with medication and denied any use of alcohol or illicit substances.  Clinician inquired about patient?s current emotional ratings, as well as any significant changes in thoughts, feelings or behavior since previous session. Patient's ratings: 6/10 for depression, 6/10 for anxiety, 3/10 for anger/irritability, trauma emotional rating 60% within the past week. All ratings are evidenced by self report. Cln explored trauma emotional rating with pt who identified her trauma triggers within the past week and coping skills used. "Mostly my trauma symptoms surround my grief process." Pt tearfully reports on her stage of grief. "I've now moved into the depression stage of grief." Cln and pt explored the depression cycle of grief. Pt reports, "Susan Guerra is working well,  however it's affecting my sleep." ?Cln suggested pt talk to her medication management provider and also provided information on sleep hygiene.Pt reports she and her SO started couples counseling and it went well. "I am hopeful. We will see the counselor weekly." ? ? ?Plan: Tacy Learn: "We can do the hard things." ? ?Collaboration of Care: Other:  Again, Cln suggested [facility] ? ?Patient/Guardian was advised Release of Information must be obtained prior to any record release in order to collaborate their care with an outside provider. Patient/Guardian was advised if they have not already done so to contact the registration department to sign all necessary forms in order for [place] to release information regarding their care.  ? ?Consent: Patient/Guardian gives verbal consent for treatment and assignment of benefits for services provided during this visit. Patient/Guardian expressed understanding and agreed to proceed.  ? ? ?PLAN: [place] and pain therapist ? ? ? ? ?Assessment and Plan: Counselor will continue to meet with patient to address treatment plan goals. Patient will continue to follow recommendations of providers and implement skills learned in session. ?  ?  ?Follow Up Instructions: I discussed the assessment and treatment plan with the patient. The patient was provided an opportunity to ask questions and all were answered. The patient agreed with the plan and demonstrated an understanding of the instructions. ?  ?The patient was advised to call back or seek an in-person evaluation if the symptoms worsen or if the condition fails to improve as anticipated. ?  ?I provided 45 minutes of non-face-to-face time during this encounter. ?  ?  ?Kaydn Kumpf S, LCAS ?   ?  ? ? ? ? ? ? ?

## 2021-11-13 ENCOUNTER — Ambulatory Visit (INDEPENDENT_AMBULATORY_CARE_PROVIDER_SITE_OTHER): Payer: No Typology Code available for payment source | Admitting: Licensed Clinical Social Worker

## 2021-11-13 ENCOUNTER — Other Ambulatory Visit: Payer: Self-pay

## 2021-11-13 ENCOUNTER — Encounter (HOSPITAL_COMMUNITY): Payer: Self-pay | Admitting: Licensed Clinical Social Worker

## 2021-11-13 DIAGNOSIS — F331 Major depressive disorder, recurrent, moderate: Secondary | ICD-10-CM | POA: Diagnosis not present

## 2021-11-13 DIAGNOSIS — F4310 Post-traumatic stress disorder, unspecified: Secondary | ICD-10-CM

## 2021-11-13 NOTE — Progress Notes (Signed)
Virtual Visit via phone Note ?  ?I connected with Susan Guerra on 11/13/21 at 9:15am by phone enabled telemedicine application and verified that I am speaking with the correct person using two identifiers. ?  ?Location: ?Patient: car ?Provider: home office ?  ?I discussed the limitations of evaluation and management by telemedicine and the availability of in person appointments. The patient expressed understanding and agreed to proceed. ?  ?History of Present Illness: Pt is referred to therapy for PTSD (MST) and MDD by the [facility]. Her biggest stressor is her 58 yo son who has explosive aspbergers and 2 younger children (18 & 5). She lives with her "husband." Pt has fibromyalgia, which affects her ability to take care of all her family's needs.  She has previous therapy. ? ?Treatment Goal Addressed: ?Pt will recall the traumatic event without becoming too overwhelmed with emotions 50% of the time, evidenced by self report. ? ?Progression towards Goal: ?Progressing ?  ?  ?Observations/Objective: Patient presented for today?s session on time and was alert, oriented x5, with no evidence or self-report of SI/HI or A/V H. Patient reported ongoing compliance with medication and denied any use of alcohol or illicit substances.  Clinician inquired about patient?s current emotional ratings, as well as any significant changes in thoughts, feelings or behavior since previous session. Patient's ratings: 6/10 for depression, 6/10 for anxiety, 3/10 for anger/irritability, recalling trauma event without becoming too overwhelmed with emotions  40% within the past week. All ratings are evidenced by self report. Cln explored trauma emotional rating with pt who identified her trauma triggers within the past week and coping skills used. "My trauma triggers have become overwhelming within the past week due to my SO wanting intimacy too often for me to handle my emotions."  Cln asked open-ended questions. Pt reports on her  stage of grief. "I' continue to be in the depression stage of grief." Again, Cln and pt explored the depression cycle of grief. Pt reports, "Susan Guerra continues working well. I am on my way to appt now." Cln asked open-ended questions.Session ended early due to [place] appt. Future appointments were changed to a different time to accomodate [place] appts. ? ? ?Plan: Tacy Learn: "We can do the hard things." ? ?Collaboration of Care: Other:  Again, Cln suggested [facility] ? ?Patient/Guardian was advised Release of Information must be obtained prior to any record release in order to collaborate their care with an outside provider. Patient/Guardian was advised if they have not already done so to contact the registration department to sign all necessary forms in order for [place] to release information regarding their care.  ? ?Consent: Patient/Guardian gives verbal consent for treatment and assignment of benefits for services provided during this visit. Patient/Guardian expressed understanding and agreed to proceed.  ? ? ?PLAN: [place] and pain therapist ? ? ? ? ?Assessment and Plan: Counselor will continue to meet with patient to address treatment plan goals. Patient will continue to follow recommendations of providers and implement skills learned in session. ?  ?  ?Follow Up Instructions: I discussed the assessment and treatment plan with the patient. The patient was provided an opportunity to ask questions and all were answered. The patient agreed with the plan and demonstrated an understanding of the instructions. ?  ?The patient was advised to call back or seek an in-person evaluation if the symptoms worsen or if the condition fails to improve as anticipated. ?  ?I provided 30 minutes of non-face-to-face time during this  encounter. ?  ?  ?Katelyn Broadnax S, LCAS ?   ?  ? ? ? ? ? ? ?

## 2021-11-20 ENCOUNTER — Ambulatory Visit (INDEPENDENT_AMBULATORY_CARE_PROVIDER_SITE_OTHER): Payer: No Typology Code available for payment source | Admitting: Licensed Clinical Social Worker

## 2021-11-20 ENCOUNTER — Ambulatory Visit (HOSPITAL_COMMUNITY): Payer: No Typology Code available for payment source | Admitting: Licensed Clinical Social Worker

## 2021-11-20 ENCOUNTER — Encounter (HOSPITAL_COMMUNITY): Payer: Self-pay | Admitting: Licensed Clinical Social Worker

## 2021-11-20 DIAGNOSIS — F331 Major depressive disorder, recurrent, moderate: Secondary | ICD-10-CM | POA: Diagnosis not present

## 2021-11-20 DIAGNOSIS — F4310 Post-traumatic stress disorder, unspecified: Secondary | ICD-10-CM

## 2021-11-20 NOTE — Progress Notes (Signed)
Virtual Visit via phone Note ?  ?I connected with Susan Guerra on 11/20/21 at 4:15am by phone enabled telemedicine application and verified that I am speaking with the correct person using two identifiers. ?  ?Location: ?Patient: home ?Provider: home office ?  ?I discussed the limitations of evaluation and management by telemedicine and the availability of in person appointments. The patient expressed understanding and agreed to proceed. ?  ?History of Present Illness: Pt is referred to therapy for PTSD (MST) and MDD by the [facility]. Her biggest stressor is her 4 yo son who has explosive aspbergers and 2 younger children (62 & 5). She lives with her "husband." Pt has fibromyalgia, which affects her ability to take care of all her family's needs.  She has previous therapy. ? ?Treatment Goal Addressed: ?Pt will recall the traumatic event without becoming too overwhelmed with emotions 50% of the time, evidenced by self report. ? ?Progression towards Goal: ?Progressing ?  ?  ?Observations/Objective: Patient presented for today?s session on time and was alert, oriented x5, with no evidence or self-report of SI/HI or A/V H. Patient reported ongoing compliance with medication and denied any use of alcohol or illicit substances.  Clinician inquired about patient?s current emotional ratings, as well as any significant changes in thoughts, feelings or behavior since previous session. Patient's ratings: 6/10 for depression, 6/10 for anxiety, 4/10 for anger/irritability, recalling traumatic event without becoming too overwhelmed with emotions  40% within the past week. All ratings are evidenced by self report. Cln explored trauma emotional rating with pt who identified her trauma triggers within the past week and coping skills used. "My trauma triggers continue to be high due to SO wanting intimacy too often for me to handle my emotions. This is affecting my PTSD."  Cln asked open-ended questions. Cln used CPT manual  to assist pt with her feelings about the MST   Pt reports, "[employer] continues working well." Pt is still living back at the house for now, but has left her belongings in the apt, for now.  ? ? ?Plan: Tacy Learn: "We can do the hard things." ? ?Collaboration of Care: Other:  Again, Cln suggested [facility] ? ?Patient/Guardian was advised Release of Information must be obtained prior to any record release in order to collaborate their care with an outside provider. Patient/Guardian was advised if they have not already done so to contact the registration department to sign all necessary forms in order for [place] to release information regarding their care.  ? ?Consent: Patient/Guardian gives verbal consent for treatment and assignment of benefits for services provided during this visit. Patient/Guardian expressed understanding and agreed to proceed.  ? ? ?PLAN: [place] and pain therapist ? ? ? ? ?Assessment and Plan: Counselor will continue to meet with patient to address treatment plan goals. Patient will continue to follow recommendations of providers and implement skills learned in session. ?  ?  ?Follow Up Instructions: I discussed the assessment and treatment plan with the patient. The patient was provided an opportunity to ask questions and all were answered. The patient agreed with the plan and demonstrated an understanding of the instructions. ?  ?The patient was advised to call back or seek an in-person evaluation if the symptoms worsen or if the condition fails to improve as anticipated. ?  ?I provided 45 minutes of non-face-to-face time during this encounter. ?  ?  ?Stephaie Dardis S, LCAS ?   ?  ? ? ? ? ? ? ?

## 2021-11-27 ENCOUNTER — Encounter (HOSPITAL_COMMUNITY): Payer: Self-pay | Admitting: Licensed Clinical Social Worker

## 2021-11-27 ENCOUNTER — Ambulatory Visit (INDEPENDENT_AMBULATORY_CARE_PROVIDER_SITE_OTHER): Payer: No Typology Code available for payment source | Admitting: Licensed Clinical Social Worker

## 2021-11-27 DIAGNOSIS — F4310 Post-traumatic stress disorder, unspecified: Secondary | ICD-10-CM | POA: Diagnosis not present

## 2021-11-27 DIAGNOSIS — F331 Major depressive disorder, recurrent, moderate: Secondary | ICD-10-CM | POA: Diagnosis not present

## 2021-11-27 NOTE — Progress Notes (Signed)
Virtual Visit via phone Note ?  ?I connected with Susan Guerra on 11/27/21 at 4:15pm by phone enabled telemedicine application and verified that I am speaking with the correct person using two identifiers. ?  ?Location: ?Patient: home ?Provider: home office ?  ?I discussed the limitations of evaluation and management by telemedicine and the availability of in person appointments. The patient expressed understanding and agreed to proceed. ?  ?History of Present Illness: Pt is referred to therapy for PTSD (MST) and MDD by the [facility]. Her biggest stressor is her 60 yo son who has explosive aspbergers and 2 younger children (14 & 5). She lives with her "husband." Pt has fibromyalgia, which affects her ability to take care of all her family's needs.  She has previous therapy. ? ?Treatment Goal Addressed: ?Pt will recall the traumatic event without becoming too overwhelmed with emotions 50% of the time, evidenced by self report. ? ?Progression towards Goal: ?Progressing ?  ?  ?Observations/Objective: Patient presented for today?s session on time and was alert, oriented x5, with no evidence or self-report of SI/HI or A/V H. Patient reported ongoing compliance with medication and denied any use of alcohol or illicit substances.  Clinician inquired about patient?s current emotional ratings, as well as any significant changes in thoughts, feelings or behavior since previous session. Patient's ratings: 6/10 for depression, 6/10 for anxiety, 6/10 for anger/irritability, recalling traumatic event without becoming too overwhelmed with emotions  50% within the past week. All ratings are evidenced by self report. Cln explored trauma emotional rating with pt who identified her trauma triggers within the past week and coping skills used. Pt reports on her continued relationship counseling and how difficult it is, which is affecting her trauma triggers.Pt reports, "TMS continues working well." Pt is still living back at the  house for now, but has left her belongings in the apt, for now. CLn and pt explored next steps and alternatives for living space. ? ? ?Plan: Susan Guerra: "We can do the hard things." ? ?Collaboration of Care: Other: Continue working with Corporate treasurer. ? ?Patient/Guardian was advised Release of Information must be obtained prior to any record release in order to collaborate their care with an outside provider. Patient/Guardian was advised if they have not already done so to contact the registration department to sign all necessary forms in order for [place] to release information regarding their care.  ? ?Consent: Patient/Guardian gives verbal consent for treatment and assignment of benefits for services provided during this visit. Patient/Guardian expressed understanding and agreed to proceed.  ? ? ?PLAN: TMS and pain therapist ? ? ? ? ?Assessment and Plan: Counselor will continue to meet with patient to address treatment plan goals. Patient will continue to follow recommendations of providers and implement skills learned in session. ?  ?  ?Follow Up Instructions: I discussed the assessment and treatment plan with the patient. The patient was provided an opportunity to ask questions and all were answered. The patient agreed with the plan and demonstrated an understanding of the instructions. ?  ?The patient was advised to call back or seek an in-person evaluation if the symptoms worsen or if the condition fails to improve as anticipated. ?  ?I provided 45 minutes of non-face-to-face time during this encounter. ?  ?  ?Susan Guerra S, LCAS ?   ?  ? ? ? ? ? ? ?

## 2021-12-04 ENCOUNTER — Encounter (HOSPITAL_COMMUNITY): Payer: Self-pay | Admitting: Licensed Clinical Social Worker

## 2021-12-04 ENCOUNTER — Ambulatory Visit (INDEPENDENT_AMBULATORY_CARE_PROVIDER_SITE_OTHER): Payer: No Typology Code available for payment source | Admitting: Licensed Clinical Social Worker

## 2021-12-04 DIAGNOSIS — F331 Major depressive disorder, recurrent, moderate: Secondary | ICD-10-CM | POA: Diagnosis not present

## 2021-12-04 DIAGNOSIS — F4310 Post-traumatic stress disorder, unspecified: Secondary | ICD-10-CM | POA: Diagnosis not present

## 2021-12-04 NOTE — Progress Notes (Signed)
Virtual Visit via phone Note ?  ?I connected with Susan Guerra on 12/04/21 at 4:15pm by phone enabled telemedicine application and verified that I am speaking with the correct person using two identifiers. ?  ?Location: ?Patient: home ?Provider: home office ?  ?I discussed the limitations of evaluation and management by telemedicine and the availability of in person appointments. The patient expressed understanding and agreed to proceed. ?  ?History of Present Illness: Pt is referred to therapy for PTSD (MST) and MDD by the [facility]. Her biggest stressor is her 52 yo son who has explosive aspbergers and 2 younger children (50 & 5). She lives with her "husband." Pt has fibromyalgia, which affects her ability to take care of all her family's needs.  She has previous therapy. ? ?Treatment Goal Addressed: ?Pt will recall the traumatic event without becoming too overwhelmed with emotions 50% of the time, evidenced by self report. ? ?Progression towards Goal: ?Progressing ?  ?  ?Observations/Objective: Patient presented for today?s session on time and was alert, oriented x5, with no evidence or self-report of SI/HI or A/V H. Patient reported ongoing compliance with medication and denied any use of alcohol or illicit substances.  Clinician inquired about patient?s current emotional ratings, as well as any significant changes in thoughts, feelings or behavior since previous session. Patient's ratings: 7/10 for depression, 7/10 for anxiety, 7/10 for anger/irritability, recalling traumatic event without becoming too overwhelmed with emotions  50% within the past week. All ratings are evidenced by self report. Cln explored trauma emotional rating with pt who identified her trauma triggers within the past week and coping skills used. Pt reports on her continued relationship counseling and how difficult it is, which is affecting her trauma triggers. Pt is experiencing some trauma during the intimacy with her SO. Cln  used CPT to address stuck points. Pt reports, "[employer] continues working well. I have 2 more weeks left. I'm not going daily." Pt continues living back at the house, relationship counseling. ? ? ?Plan: Tacy Learn: "We can do the hard things." ? ?Collaboration of Care: Other: Continue working with Nurse, adult. ? ?Patient/Guardian was advised Release of Information must be obtained prior to any record release in order to collaborate their care with an outside provider.  ? ?Patient/Guardian was advised if they have not already done so to contact the registration department to sign all necessary forms in order for [place] to release information regarding their care.  ? ?Consent: Patient/Guardian gives verbal consent for treatment and assignment of benefits for services provided during this visit. Patient/Guardian expressed understanding and agreed to proceed.  ? ? ?Assessment and Plan: Counselor will continue to meet with patient to address treatment plan goals. Patient will continue to follow recommendations of providers and implement skills learned in session. ?  ?  ?Follow Up Instructions: I discussed the assessment and treatment plan with the patient. The patient was provided an opportunity to ask questions and all were answered. The patient agreed with the plan and demonstrated an understanding of the instructions. ?  ?The patient was advised to call back or seek an in-person evaluation if the symptoms worsen or if the condition fails to improve as anticipated. ?  ?I provided 45 minutes of non-face-to-face time during this encounter. ?  ?  ?Vedant Shehadeh S, LCAS ?   ?  ? ? ? ? ? ? ?

## 2021-12-11 ENCOUNTER — Ambulatory Visit (INDEPENDENT_AMBULATORY_CARE_PROVIDER_SITE_OTHER): Payer: Medicaid Other | Admitting: Licensed Clinical Social Worker

## 2021-12-11 DIAGNOSIS — F4310 Post-traumatic stress disorder, unspecified: Secondary | ICD-10-CM | POA: Diagnosis not present

## 2021-12-11 DIAGNOSIS — F331 Major depressive disorder, recurrent, moderate: Secondary | ICD-10-CM

## 2021-12-17 ENCOUNTER — Encounter (HOSPITAL_COMMUNITY): Payer: Self-pay | Admitting: Licensed Clinical Social Worker

## 2021-12-17 NOTE — Progress Notes (Signed)
Virtual Visit via phone Note ?  ?I connected with Carey Bullocks on 12/11/21 at 4:00pm by phone enabled telemedicine application and verified that I am speaking with the correct person using two identifiers. ?  ?Location: ?Patient: home ?Provider: home office ?  ?I discussed the limitations of evaluation and management by telemedicine and the availability of in person appointments. The patient expressed understanding and agreed to proceed. ?  ?History of Present Illness: Pt is referred to therapy for PTSD (MST) and MDD by the [facility]. Her biggest stressor is her 44 yo son who has explosive aspbergers and 2 younger children (14 & 5). She lives with her "husband." Pt has fibromyalgia, which affects her ability to take care of all her family's needs.  She has previous therapy. ? ?Treatment Goal Addressed: ?Pt will recall the traumatic event without becoming too overwhelmed with emotions 50% of the time, evidenced by self report. ? ?Progression towards Goal: ?Progressing ?  ?  ?Observations/Objective: Patient presented for today?s session on time and was alert, oriented x5, with no evidence or self-report of SI/HI or A/V H. Patient reported ongoing compliance with medication and denied any use of alcohol or illicit substances.  Clinician inquired about patient?s current emotional ratings, as well as any significant changes in thoughts, feelings or behavior since previous session. Patient's ratings: 5/10 for depression, 5/10 for anxiety, 3/10 for anger/irritability, recalling traumatic event without becoming too overwhelmed with emotions  50% within the past week. All ratings are evidenced by self report. Cln explored trauma emotional rating with pt who identified her trauma triggers within the past week and coping skills used. Pt reports on her continued relationship counseling and how difficult it is, which is affecting her trauma triggers. Cln used CPT to help the pt evaluate and change her negative thoughts  which can change the way she feels. Pt reports "TMS continues working well.and I can tell a difference in my moods." ? ? ?Plan: [name]: "We can do the hard things." ? ?Collaboration of Care: Other: Continue working with Corporate treasurer. ? ?Patient/Guardian was advised Release of Information must be obtained prior to any record release in order to collaborate their care with an outside provider.  ? ?Patient/Guardian was advised if they have not already done so to contact the registration department to sign all necessary forms in order for [place] to release information regarding their care.  ? ?Consent: Patient/Guardian gives verbal consent for treatment and assignment of benefits for services provided during this visit. Patient/Guardian expressed understanding and agreed to proceed.  ? ? ?Assessment and Plan: Counselor will continue to meet with patient to address treatment plan goals. Patient will continue to follow recommendations of providers and implement skills learned in session. ?  ?  ?Follow Up Instructions: I discussed the assessment and treatment plan with the patient. The patient was provided an opportunity to ask questions and all were answered. The patient agreed with the plan and demonstrated an understanding of the instructions. ?  ?The patient was advised to call back or seek an in-person evaluation if the symptoms worsen or if the condition fails to improve as anticipated. ?  ?I provided 45 minutes of non-face-to-face time during this encounter. ?  ?  ?Safiyah Cisney S, LCAS ?   ?  ? ? ? ? ? ? ?

## 2021-12-18 ENCOUNTER — Ambulatory Visit (INDEPENDENT_AMBULATORY_CARE_PROVIDER_SITE_OTHER): Payer: Medicaid Other | Admitting: Licensed Clinical Social Worker

## 2021-12-18 DIAGNOSIS — F4310 Post-traumatic stress disorder, unspecified: Secondary | ICD-10-CM | POA: Diagnosis not present

## 2021-12-18 DIAGNOSIS — F331 Major depressive disorder, recurrent, moderate: Secondary | ICD-10-CM

## 2021-12-24 ENCOUNTER — Ambulatory Visit (INDEPENDENT_AMBULATORY_CARE_PROVIDER_SITE_OTHER): Payer: Medicaid Other | Admitting: Licensed Clinical Social Worker

## 2021-12-24 DIAGNOSIS — F4310 Post-traumatic stress disorder, unspecified: Secondary | ICD-10-CM | POA: Diagnosis not present

## 2021-12-24 DIAGNOSIS — F331 Major depressive disorder, recurrent, moderate: Secondary | ICD-10-CM

## 2021-12-26 ENCOUNTER — Encounter (HOSPITAL_COMMUNITY): Payer: Self-pay | Admitting: Licensed Clinical Social Worker

## 2021-12-26 NOTE — Progress Notes (Signed)
Virtual Visit via video Note ?  ?I connected with Susan Guerra on 12/18/21 at 4:00pm by video enabled telemedicine application and verified that I am speaking with the correct person using two identifiers. ?  ?Location: ?Patient: home ?Provider: home office ?  ?I discussed the limitations of evaluation and management by telemedicine and the availability of in person appointments. The patient expressed understanding and agreed to proceed. ?  ?History of Present Illness: Pt is referred to therapy for PTSD (MST) and MDD by the [facility]. Her biggest stressor is her 68 yo son who has explosive aspbergers and 2 younger children (14 & 5). She lives with her "husband." Pt has fibromyalgia, which affects her ability to take care of all her family's needs.  She has previous therapy. ? ?Treatment Goal Addressed: ?Pt will recall the traumatic event without becoming too overwhelmed with emotions 50% of the time, evidenced by self report. ? ?Progression towards Goal: ?Progressing ?  ?  ?Observations/Objective: Patient presented for today?s session on time and was alert, oriented x5, with no evidence or self-report of SI/HI or A/V H. Patient reported ongoing compliance with medication and denied any use of alcohol or illicit substances.  Clinician inquired about patient?s current emotional ratings, as well as any significant changes in thoughts, feelings or behavior since previous session. Patient's ratings: 5/10 for depression, 5/10 for anxiety, 3/10 for anger/irritability, recalling traumatic event without becoming too overwhelmed with emotions  50% of the time within the past week. All ratings are evidenced by self report. Cln explored trauma emotional rating with pt who identified her trauma triggers within the past week and coping skills used. Pt reports on her continued relationship counseling and how difficult it is, which is affecting her trauma triggers. Cln used CPT to teach patient skills to help her decide  whether there are more helpful ways to think about her trauma.TMS continues working well and I can tell a difference in my moods.TMS ends next week." ? ? ?Plan: [name]: "We can do the hard things." ? ?Collaboration of Care: Other: Continue working with Corporate treasurer. ? ?Patient/Guardian was advised Release of Information must be obtained prior to any record release in order to collaborate their care with an outside provider.  ? ?Patient/Guardian was advised if they have not already done so to contact the registration department to sign all necessary forms in order for [place] to release information regarding their care.  ? ?Consent: Patient/Guardian gives verbal consent for treatment and assignment of benefits for services provided during this visit. Patient/Guardian expressed understanding and agreed to proceed.  ? ? ?Assessment and Plan: Counselor will continue to meet with patient to address treatment plan goals. Patient will continue to follow recommendations of providers and implement skills learned in session. ?  ?  ?Follow Up Instructions: I discussed the assessment and treatment plan with the patient. The patient was provided an opportunity to ask questions and all were answered. The patient agreed with the plan and demonstrated an understanding of the instructions. ?  ?The patient was advised to call back or seek an in-person evaluation if the symptoms worsen or if the condition fails to improve as anticipated. ?  ?I provided 45 minutes of non-face-to-face time during this encounter. ?  ?  ?Bunyan Brier S, LCAS ?   ?  ? ? ? ? ? ? ?

## 2021-12-26 NOTE — Progress Notes (Signed)
Virtual Visit via video Note ?  ?I connected with Susan Guerra on 12/24/21 at 9:00am by video enabled telemedicine application and verified that I am speaking with the correct person using two identifiers. ?  ?Location: ?Patient: home ?Provider: home office ?  ?I discussed the limitations of evaluation and management by telemedicine and the availability of in person appointments. The patient expressed understanding and agreed to proceed. ?  ?History of Present Illness: Pt is referred to therapy for PTSD (MST) and MDD by the [facility]. Her biggest stressor is her 44 yo son who has explosive aspbergers and 2 younger children (14 & 5). She lives with her "husband." Pt has fibromyalgia, which affects her ability to take care of all her family's needs.  She has previous therapy. ? ?Treatment Goal Addressed: ?Pt will recall the traumatic event without becoming too overwhelmed with emotions 50% of the time, evidenced by self report. ? ?Progression towards Goal: ?Progressing ?  ?  ?Observations/Objective: Patient presented for today?s session on time and was alert, oriented x5, with no evidence or self-report of SI/HI or A/V H. Patient reported ongoing compliance with medication and denied any use of alcohol or illicit substances.  Clinician inquired about patient?s current emotional ratings, as well as any significant changes in thoughts, feelings or behavior since previous session. Patient's ratings: 6/10 for depression, 6/10 for anxiety, 3/10 for anger/irritability, recalling traumatic event without becoming too overwhelmed with emotions  50% of the time within the past week. All ratings are evidenced by self report. Cln explored trauma emotional rating with pt who identified her trauma triggers within the past week and coping skills used. Pt reports tearfully, "my best friends' mother will probably pass this week. I'm grieving my own mother's passing, and it's mother's day this week. I completed TMS  yesterday  and It has helped me with my emotions, but this is so overwhelming. " Cln emailed pt grief workbook, gave her information for Hospice. ? ? ? ? ?Plan: Susan Guerra: "We can do the hard things." ? ?Collaboration of Care: Other: Continue working with Corporate treasurer. ? ?Patient/Guardian was advised Release of Information must be obtained prior to any record release in order to collaborate their care with an outside provider.  ? ?Patient/Guardian was advised if they have not already done so to contact the registration department to sign all necessary forms in order for [place] to release information regarding their care.  ? ?Consent: Patient/Guardian gives verbal consent for treatment and assignment of benefits for services provided during this visit. Patient/Guardian expressed understanding and agreed to proceed.  ? ? ?Assessment and Plan: Counselor will continue to meet with patient to address treatment plan goals. Patient will continue to follow recommendations of providers and implement skills learned in session. ?  ?  ?Follow Up Instructions: I discussed the assessment and treatment plan with the patient. The patient was provided an opportunity to ask questions and all were answered. The patient agreed with the plan and demonstrated an understanding of the instructions. ?  ?The patient was advised to call back or seek an in-person evaluation if the symptoms worsen or if the condition fails to improve as anticipated. ?  ?I provided 45 minutes of non-face-to-face time during this encounter. ?  ?  ?Nira Visscher S, LCAS ?   ?  ? ? ? ? ? ? ?

## 2021-12-31 ENCOUNTER — Ambulatory Visit (INDEPENDENT_AMBULATORY_CARE_PROVIDER_SITE_OTHER): Payer: Medicaid Other | Admitting: Licensed Clinical Social Worker

## 2021-12-31 ENCOUNTER — Encounter (HOSPITAL_COMMUNITY): Payer: Self-pay | Admitting: Licensed Clinical Social Worker

## 2021-12-31 DIAGNOSIS — F4310 Post-traumatic stress disorder, unspecified: Secondary | ICD-10-CM

## 2021-12-31 DIAGNOSIS — F331 Major depressive disorder, recurrent, moderate: Secondary | ICD-10-CM | POA: Diagnosis not present

## 2021-12-31 NOTE — Progress Notes (Signed)
Comprehensive Clinical Assessment (CCA) Note ? ?Virtual Visit via Video Note ?  ?I connected with Susan Guerra on 12/31/21 at 9:00 AM EDT by a video enabled telemedicine application and verified that I am speaking with the correct person using two identifiers. ?  ?Location: ?Patient: Home ?Provider: Home Office ?  ?I discussed the limitations of evaluation and management by telemedicine and the availability of in person appointments. The patient expressed understanding and agreed to proceed. ?  ? ? ? ? ? ?12/31/2021 ?Susan BullocksBrawley Guerra ?[id number] ? ?Chief Complaint:  ?Chief Complaint  ?Patient presents with  ? Trauma  ? Depression  ? ?Visit Diagnosis: PTSD, MDD  ? ? ?CCA Screening, Triage and Referral (STR) ? ?Patient Reported Information ?How did you hear about us? No data recorded ?Referral name: No data recorded ?Referral phone number: No data recorded ? ?Whom do you see for routine medical problems? No data recorded ?Practice/Facility Name: No data recorded ?Practice/Facility Phone Number: No data recorded ?Name of Contact: No data recorded ?Contact Number: No data recorded ?Contact Fax Number: No data recorded ?Prescriber Name: No data recorded ?Prescriber Address (if known): No data recorded ? ?What Is the Reason for Your Visit/Call Today? No data recorded ?How Long Has This Been Causing You Problems? No data recorded ?What Do You Feel Would Help You the Most Today? No data recorded ? ?Have You Recently Been in Any Inpatient Treatment (Hospital/Detox/Crisis Center/28-Day Program)? No data recorded ?Name/Location of Program/Hospital:No data recorded ?How Long Were You There? No data recorded ?When Were You Discharged? No data recorded ? ?[facility] You [facility]? No data recorded ?[facility]? No data recorded ? ?Have You Recently Had Any Thoughts About Hurting Yourself? No data recorded ?Are You Planning to Commit Suicide/Harm Yourself At This time? No data  recorded ? ?Have you Recently Had Thoughts About Hurting Someone [name]? No data recorded ?Explanation: No data recorded ? ?Have You Used Any Alcohol or Drugs in the Past 24 Hours? No data recorded ?How Long Ago Did You Use Drugs or Alcohol? No data recorded ?What Did You Use and How Much? No data recorded ? ?Do You Currently Have a Therapist/Psychiatrist? No data recorded ?Name of Therapist/Psychiatrist: No data recorded ? ?Have You Been Recently Discharged From Any Office Practice or Programs? No data recorded ?Explanation of Discharge From Practice/Program: No data recorded ? ?  ?CCA Screening Triage Referral Assessment ?Type of Contact: No data recorded ?Is this Initial or Reassessment? No data recorded ?Date Telepsych consult ordered in [facility]:  No data recorded ?Time Telepsych consult ordered in [facility]:  No data recorded ? ?Patient Reported Information Reviewed? No data recorded ?Patient Left Without Being Seen? No data recorded ?Reason for Not Completing Assessment: No data recorded ? ?Collateral Involvement: No data recorded ? ?Does Patient Have a Automotive engineerCourt Appointed Legal Guardian? No data recorded ?Name and Contact of Legal Guardian: No data recorded ?If Minor and Not Living with Parent(s), Who has Custody? No data recorded ?Is CPS involved or ever been involved? No data recorded ?Is APS involved or ever been involved? No data recorded ? ?Patient Determined To Be At Risk for Harm To Self or Others Based on Review of Patient Reported Information or Presenting Complaint? No data recorded ?Method: No data recorded ?Availability of Means: No data recorded ?Intent: No data recorded ?Notification Required: No data recorded ?Additional Information for Danger to Others Potential: No data recorded ?Additional Comments for Danger to Others Potential: No  data recorded ?Are There Guns or Other Weapons in Your Home? No data recorded ?Types of Guns/Weapons: No data recorded ?Are These Weapons Safely Secured?                             No data recorded ?Who Could Verify You Are Able To Have These Secured: No data recorded ?Do You Have any Outstanding Charges, Pending Court Dates, Parole/Probation? No data recorded ?Contacted To Inform of Risk of Harm To Self or Others: No data recorded ? ?Location of Assessment: No data recorded ? ?Does Patient Present under Involuntary Commitment? No data recorded ?IVC Papers Initial File Date: No data recorded ? ?[place] of Residence: No data recorded ? ?Patient Currently Receiving the Following Services: No data recorded ? ?Determination of Need: No data recorded ? ?Options For Referral: No data recorded ? ? ? ?CCA Biopsychosocial ?Intake/Chief Complaint:  Pt is referred to therapy for PTSD (MST) and MDD by the [facility]. PTSD from MST in the [employer] Her stressors: her 84 yo son who has explosive aspbergers, her long-term relationship  She has previous therapy. Pt just completed TMS at [facility], mutliple deaths, multiple health issues ? ?Current Symptoms/Problems: depressive, anxious, trauma related symptoms ? ? ?Patient Reported Schizophrenia/Schizoaffective Diagnosis in Past: No data recorded ? ?Strengths: family support and previous therapy ? ?Preferences: outpatient services ? ?Abilities: No data recorded ? ?Type of Services Patient Feels are Needed: outpatient therapy ? ? ?Initial Clinical Notes/Concerns: No data recorded ? ?Mental Health Symptoms ?Depression:   ?Change in energy/activity; Difficulty Concentrating; Fatigue; Hopelessness; Worthlessness; Irritability; Sleep (too much or little); Increase/decrease in appetite; Tearfulness ?  ?Duration of Depressive symptoms: No data recorded  ?Mania:   ?None ?  ?Anxiety:    ?Difficulty concentrating; Fatigue; Irritability; Restlessness; Sleep; Tension; Worrying ?  ?Psychosis:   ?None ?  ?Duration of Psychotic symptoms: No data recorded  ?Trauma:   ?Avoids reminders of event; Detachment from others; Difficulty staying/falling asleep; Emotional numbing;  Guilt/shame; Hypervigilance; Irritability/anger; Re-experience of traumatic event ?  ?Obsessions:   ?Cause anxiety; Intrusive/time consuming; Recurrent & persistent thoughts/impulses/images ?  ?Compulsions:   ?Disrupts with routine/functioning; "Driven" to perform behaviors/acts; Intrusive/time consuming; Repeated behaviors/mental acts ?  ?Inattention:   ?None ?  ?Hyperactivity/Impulsivity:   ?N/A ?  ?Oppositional/Defiant Behaviors:   ?None ?  ?Emotional Irregularity:  No data recorded  ?Other Mood/Personality Symptoms:  No data recorded  ? ?Mental Status Exam ?Appearance and self-care  ?Stature:   ?Average ?  ?Weight:   ?Average weight ?  ?Clothing:   ?Casual ?  ?Grooming:   ?Normal ?  ?Cosmetic use:   ?None ?  ?Posture/gait:   ?Normal ?  ?Motor activity:   ?Restless ?  ?Sensorium  ?Attention:   ?Normal ?  ?Concentration:   ?Anxiety interferes ?  ?Orientation:   ?X5 ?  ?Recall/memory:   ?Defective in Short-term; Defective in Remote ?  ?Affect and Mood  ?Affect:   ?Depressed ?  ?Mood:   ?Depressed ?  ?Relating  ?Eye contact:   ?Normal ?  ?Facial expression:   ?Depressed ?  ?Attitude toward examiner:   ?Cooperative ?  ?Thought and Language  ?Speech flow:  ?Clear and Coherent ?  ?Thought content:   ?Appropriate to Mood and Circumstances ?  ?Preoccupation:   ?Ruminations ?  ?Hallucinations:   ?Visual ?  ?Organization:  No data recorded  ?Executive Functions  ?Fund of Knowledge:   ?Impoverished by (Comment) ?  ?  Intelligence:   ?Average ?  ?Abstraction:   ?Normal ?  ?Judgement:   ?Fair ?  ?Reality Testing:   ?Realistic ?  ?Insight:   ?Fair ?  ?Decision Making:   ?Impulsive ?  ?Social Functioning  ?Social Maturity:   ?Isolates ?  ?Social Judgement:   ?Normal ?  ?Stress  ?Stressors:   ?Family conflict; Grief/losses; Relationship; Illness; Transitions ?  ?Coping Ability:   ?Normal ?  ?Skill Deficits:  No data recorded  ?Supports:   ?Family ?  ? ? ?Religion: ?Religion/Spirituality ?Are You A Religious Person?: Yes ?What is  Your Religious Affiliation?: Pentecostal ? ?Leisure/Recreation: ?Leisure / Recreation ?Do You Have Hobbies?: Yes ?Leisure and Hobbies: singing, seeing friends ? ?Exercise/Diet: ?Exercise/Diet ?Do You Exercise?:

## 2022-01-03 ENCOUNTER — Emergency Department
Admission: EM | Admit: 2022-01-03 | Discharge: 2022-01-03 | Disposition: A | Discharge status: Home / Self Care | Payer: No Typology Code available for payment source | Attending: Emergency Medicine | Admitting: Emergency Medicine

## 2022-01-03 ENCOUNTER — Encounter: Payer: Self-pay | Admitting: Emergency Medicine

## 2022-01-03 ENCOUNTER — Emergency Department: Payer: No Typology Code available for payment source

## 2022-01-03 ENCOUNTER — Other Ambulatory Visit: Payer: Self-pay

## 2022-01-03 DIAGNOSIS — R110 Nausea: Secondary | ICD-10-CM | POA: Insufficient documentation

## 2022-01-03 DIAGNOSIS — R1084 Generalized abdominal pain: Secondary | ICD-10-CM | POA: Diagnosis not present

## 2022-01-03 LAB — CBC
HCT: 42.1 % (ref 36.0–46.0)
Hemoglobin: 13.1 g/dL (ref 12.0–15.0)
MCH: 26.6 pg (ref 26.0–34.0)
MCHC: 31.1 g/dL (ref 30.0–36.0)
MCV: 85.6 fL (ref 80.0–100.0)
Platelets: 347 10*3/uL (ref 150–400)
RBC: 4.92 MIL/uL (ref 3.87–5.11)
RDW: 13.2 % (ref 11.5–15.5)
WBC: 5.1 10*3/uL (ref 4.0–10.5)
nRBC: 0 % (ref 0.0–0.2)

## 2022-01-03 LAB — LIPASE, BLOOD: Lipase: 30 U/L (ref 11–51)

## 2022-01-03 LAB — URINALYSIS, ROUTINE W REFLEX MICROSCOPIC
Bilirubin Urine: NEGATIVE
Glucose, UA: NEGATIVE mg/dL
Hgb urine dipstick: NEGATIVE
Ketones, ur: NEGATIVE mg/dL
Leukocytes,Ua: NEGATIVE
Nitrite: NEGATIVE
Protein, ur: NEGATIVE mg/dL
Specific Gravity, Urine: 1.023 (ref 1.005–1.030)
pH: 6 (ref 5.0–8.0)

## 2022-01-03 LAB — COMPREHENSIVE METABOLIC PANEL
ALT: 35 U/L (ref 0–44)
AST: 27 U/L (ref 15–41)
Albumin: 4.4 g/dL (ref 3.5–5.0)
Alkaline Phosphatase: 40 U/L (ref 38–126)
Anion gap: 7 (ref 5–15)
BUN: 17 mg/dL (ref 6–20)
CO2: 26 mmol/L (ref 22–32)
Calcium: 9.5 mg/dL (ref 8.9–10.3)
Chloride: 107 mmol/L (ref 98–111)
Creatinine, Ser: 0.95 mg/dL (ref 0.44–1.00)
GFR, Estimated: >60 mL/min (ref >60)
Glucose, Bld: 98 mg/dL (ref 70–99)
Potassium: 4.4 mmol/L (ref 3.5–5.1)
Sodium: 140 mmol/L (ref 135–145)
Total Bilirubin: 0.5 mg/dL (ref 0.3–1.2)
Total Protein: 8.1 g/dL (ref 6.5–8.1)

## 2022-01-03 MED ORDER — ONDANSETRON HCL 4 MG/2ML IJ SOLN
4.0000 mg | Freq: Once | INTRAMUSCULAR | Status: AC
  Administered 2022-01-03: 4 mg via INTRAVENOUS
  Filled 2022-01-03: qty 2

## 2022-01-03 MED ORDER — TRAMADOL HCL 50 MG PO TABS: 50.0000 mg | ORAL_TABLET | Freq: Four times a day (QID) | ORAL | 0 refills | Status: AC | PRN

## 2022-01-03 MED ORDER — IOHEXOL 300 MG/ML  SOLN
100.0000 mL | Freq: Once | INTRAMUSCULAR | Status: AC | PRN
  Administered 2022-01-03: 100 mL via INTRAVENOUS

## 2022-01-03 MED ORDER — SENNOSIDES-DOCUSATE SODIUM 8.6-50 MG PO TABS: 2.0000 | ORAL_TABLET | Freq: Two times a day (BID) | ORAL | 0 refills | Status: AC

## 2022-01-03 MED ORDER — KETOROLAC TROMETHAMINE 15 MG/ML IJ SOLN
15.0000 mg | Freq: Once | INTRAMUSCULAR | Status: AC
  Administered 2022-01-03: 15 mg via INTRAVENOUS
  Filled 2022-01-03: qty 1

## 2022-01-03 MED ORDER — SODIUM CHLORIDE 0.9 % IV BOLUS
1000.0000 mL | Freq: Once | INTRAVENOUS | Status: AC
  Administered 2022-01-03: 1000 mL via INTRAVENOUS

## 2022-01-03 MED ORDER — ONDANSETRON 4 MG PO TBDP: 4.0000 mg | ORAL_TABLET | Freq: Three times a day (TID) | ORAL | 0 refills | Status: DC | PRN

## 2022-01-03 NOTE — ED Triage Notes (Signed)
Pt via POV from home. Pt c/o mid to lower R sided abd pain and lower back since Monday. Pt endorse nausea. Denies VD. Denies fevers. Pt states that eating hurts worse. States that she has a hx of gallstones, but still has her gallbladder and appendix. States that she had and episode of "explosive diarrhea" last Friday and had a bowel movement since then. Pt has tried OTC medication for the constipation with no relief. Pt is A&Ox4 and NAD

## 2022-01-03 NOTE — ED Provider Notes (Signed)
[facility] Provider Note    Event Date/Time   First MD Initiated Contact with Patient 01/03/22 [phone]     (approximate)   History   Abdominal Pain and Back Pain   HPI  Susan Guerra is a 46 y.o. female with a history of migraines, PTSD, major depression, hysterectomy who comes the ED complaining of diffuse right-sided abdominal pain for the past week.  Gradual onset, waxing and waning, worse with eating high-fat foods.  Radiates to the back.  Associated with constipation for the past week.  She has nausea but no vomiting.  Pain is sharp and worsened with movement and changing position as well.  No chest pain shortness of breath or fever.  No lower extremity weakness or incontinence.     Physical Exam   Triage Vital Signs: ED Triage Vitals  Enc Vitals Group     BP 01/03/22 0821 (!) 138/100     Pulse Rate 01/03/22 0821 80     Resp 01/03/22 0821 18     Temp 01/03/22 0821 98.4 F (36.9 C)     Temp Source 01/03/22 0821 Oral     SpO2 01/03/22 0821 100 %     Weight 01/03/22 0824 160 lb (72.6 kg)     Height 01/03/22 0824 5' 5.5" (1.664 m)     Head Circumference --      Peak Flow --      Pain Score 01/03/22 0822 7     Pain Loc --      Pain Edu? --      Excl. in GC? --     Most recent vital signs: Vitals:   01/03/22 0821 01/03/22 1219  BP: (!) 138/100 137/81  Pulse: 80 72  Resp: 18 18  Temp: 98.4 F (36.9 C)   SpO2: 100% 100%    General: Awake, no distress.  CV:  Good peripheral perfusion.  Regular rate rhythm Resp:  Normal effort.  Clear to auscultation bilaterally Abd:  No distention.  Soft with marked diffuse right-sided and lower abdominal tenderness.  No rebound rigidity or guarding. Other:  No rash   ED Results / Procedures / Treatments   Labs (all labs ordered are listed, but only abnormal results are displayed) Labs Reviewed  URINALYSIS, ROUTINE W REFLEX MICROSCOPIC - Abnormal; Notable for the following components:      Result  Value   Color, Urine YELLOW (*)    APPearance HAZY (*)    All other components within normal limits  LIPASE, BLOOD  COMPREHENSIVE METABOLIC PANEL  CBC     EKG    RADIOLOGY CT abdomen pelvis viewed and interpreted by me, no acute findings, no evidence of obstruction or free air, no large volume of constipation.  Ultrasound right upper quadrant unremarkable.   PROCEDURES:  Procedures   MEDICATIONS ORDERED IN ED: Medications  sodium chloride 0.9 % bolus 1,000 mL (0 mLs Intravenous Stopped 01/03/22 1220)  ondansetron (ZOFRAN) injection 4 mg (4 mg Intravenous Given 01/03/22 1046)  ketorolac (TORADOL) 15 MG/ML injection 15 mg (15 mg Intravenous Given 01/03/22 1043)  iohexol (OMNIPAQUE) 300 MG/ML solution 100 mL (100 mLs Intravenous Contrast Given 01/03/22 1049)     IMPRESSION / MDM / ASSESSMENT AND PLAN / ED COURSE  I reviewed the triage vital signs and the nursing notes.                              Differential diagnosis  includes, but is not limited to, appendicitis, cholecystitis, pancreatitis, bowel obstruction, diverticulitis, cystitis, ureterolithiasis, bowel perforation, intra-abdominal abscess  Patient's presentation is most consistent with acute presentation with potential threat to life or bodily function.    Patient presents with severe abdominal pain which has been ongoing for the past week and unremitting.  Doubt STI PID TOA torsion or vaginal cuff dehiscence.  Differential is broad and will need to obtain labs and CT scan of the abdomen and pelvis to further evaluate.  Will give IV Zofran and Toradol for nausea and pain relief, along with IV fluids for hydration.  ----------------------------------------- 2:10 PM on 01/03/2022 ----------------------------------------- CT abdomen pelvis is unremarkable.  Ultrasound right upper quadrant unremarkable.  Labs are all normal.  Do not find any acute issues.  Patient states that her pain is better.  With further  discussion she reports that this feels similar to when she had an ovarian cyst in the past, and is possible that her pain is due to a ruptured cyst that is no longer present but causing inflammation.  I will prescribe medications to control her symptoms at home, advised her to monitor symptoms for gradual improvement, return to ED if worsening.      FINAL CLINICAL IMPRESSION(S) / ED DIAGNOSES   Final diagnoses:  Generalized abdominal pain     Rx / DC Orders   ED Discharge Orders          Ordered    traMADol (ULTRAM) 50 MG tablet  Every 6 hours PRN        01/03/22 1410    ondansetron (ZOFRAN-ODT) 4 MG disintegrating tablet  Every 8 hours PRN        01/03/22 1410    senna-docusate (SENOKOT-S) 8.6-50 MG tablet  2 times daily        01/03/22 1410             Note:  This document was prepared using Dragon voice recognition software and may include unintentional dictation errors.   Sharman Cheek, MD 01/03/22 [phone]

## 2022-01-03 NOTE — ED Notes (Signed)
Pt up to toilet 

## 2022-01-03 NOTE — ED Notes (Signed)
Taken to ct  

## 2022-01-07 ENCOUNTER — Encounter (HOSPITAL_COMMUNITY): Payer: Self-pay | Admitting: Licensed Clinical Social Worker

## 2022-01-07 ENCOUNTER — Ambulatory Visit (INDEPENDENT_AMBULATORY_CARE_PROVIDER_SITE_OTHER): Payer: Medicaid Other | Admitting: Licensed Clinical Social Worker

## 2022-01-07 DIAGNOSIS — F4310 Post-traumatic stress disorder, unspecified: Secondary | ICD-10-CM | POA: Diagnosis not present

## 2022-01-07 DIAGNOSIS — F331 Major depressive disorder, recurrent, moderate: Secondary | ICD-10-CM | POA: Diagnosis not present

## 2022-01-07 NOTE — Progress Notes (Signed)
Virtual Visit via video Note   I connected with Susan Guerra on 01/07/22 at 9:00am by video enabled telemedicine application and verified that I am speaking with the correct person using two identifiers.   Location: Patient: home Provider: home office   I discussed the limitations of evaluation and management by telemedicine and the availability of in person appointments. The patient expressed understanding and agreed to proceed.   History of Present Illness: Pt is referred to therapy for PTSD (MST) and MDD by the [facility]. Her biggest stressor is her 44 yo son who has explosive aspbergers and 2 younger children (15 & 6). She lives with her "husband." Pt has fibromyalgia, which affects her ability to take care of all her family's needs.  She has previous therapy.  Treatment Goal Addressed: Pt will recall the traumatic event without becoming too overwhelmed with emotions 50% of the time, evidenced by self report.  Progression towards Goal: Progressing     Observations/Objective: Patient presented for today's session on time and was alert, oriented x5, with no evidence or self-report of SI/HI or A/V H. Patient reported ongoing compliance with medication and denied any use of alcohol or illicit substances.  Clinician inquired about patient's current emotional ratings, as well as any significant changes in thoughts, feelings or behavior since previous session. Patient's ratings: 6/10 for depression, 6/10 for anxiety, 3/10 for anger/irritability, recalling traumatic event without becoming too overwhelmed with emotions  50% of the time within the past week. All ratings are evidenced by self report. Cln explored trauma emotional rating with pt who identified her trauma triggers within the past week and coping skills used. Pt reports tearfully, "I;m struggling while I'm continuing to stay in the relationship.He has been triggering my PTSD."  Clinician utilized CBT to address her thought  processes, support and confidence in her decisions.      Collaboration of Care: Other: Continue working with Corporate treasurer.  Patient/Guardian was advised Release of Information must be obtained prior to any record release in order to collaborate their care with an outside provider.   Patient/Guardian was advised if they have not already done so to contact the registration department to sign all necessary forms in order for [place] to release information regarding their care.   Consent: Patient/Guardian gives verbal consent for treatment and assignment of benefits for services provided during this visit. Patient/Guardian expressed understanding and agreed to proceed.    Assessment and Plan: Counselor will continue to meet with patient to address treatment plan goals. Patient will continue to follow recommendations of providers and implement skills learned in session.     Follow Up Instructions: I discussed the assessment and treatment plan with the patient. The patient was provided an opportunity to ask questions and all were answered. The patient agreed with the plan and demonstrated an understanding of the instructions.   The patient was advised to call back or seek an in-person evaluation if the symptoms worsen or if the condition fails to improve as anticipated.   I provided 45 minutes of non-face-to-face time during this encounter.     Yvone Slape S, LCAS

## 2022-01-14 ENCOUNTER — Encounter (INDEPENDENT_AMBULATORY_CARE_PROVIDER_SITE_OTHER): Payer: Medicaid Other | Admitting: Licensed Clinical Social Worker

## 2022-01-14 ENCOUNTER — Encounter (HOSPITAL_COMMUNITY): Payer: Self-pay | Admitting: Licensed Clinical Social Worker

## 2022-01-14 DIAGNOSIS — F4310 Post-traumatic stress disorder, unspecified: Secondary | ICD-10-CM

## 2022-01-14 DIAGNOSIS — F331 Major depressive disorder, recurrent, moderate: Secondary | ICD-10-CM

## 2022-01-14 NOTE — Progress Notes (Signed)
Virtual Visit via video Note   I connected with Susan Guerra on 01/14/22 at 9:00am by video enabled telemedicine application and verified that I am speaking with the correct person using two identifiers.   Location: Patient: home Provider: home office   I discussed the limitations of evaluation and management by telemedicine and the availability of in person appointments. The patient expressed understanding and agreed to proceed.   History of Present Illness: Pt is referred to therapy for PTSD (MST) and MDD by the [facility]. Her biggest stressor is her 40 yo son who has explosive aspbergers and 2 younger children (15 & 6). She lives with her "husband." Pt has fibromyalgia, which affects her ability to take care of all her family's needs.  She has previous therapy.  Treatment Goal Addressed: Pt will recall the traumatic event without becoming too overwhelmed with emotions 50% of the time, evidenced by self report.  Progression towards Goal: Progressing     Observations/Objective: Patient presented for today's session on time and was alert, oriented x5, with no evidence or self-report of SI/HI or A/V H. Patient reported ongoing compliance with medication and denied any use of alcohol or illicit substances.  Clinician inquired about patient's current emotional ratings, as well as any significant changes in thoughts, feelings or behavior since previous session. Patient's ratings: 2/10 for depression, 5/10 for anxiety, 2/10 for anger/irritability, recalling traumatic event without becoming too overwhelmed with emotions  50% of the time within the past week. All ratings are evidenced by self report. Cln explored trauma emotional rating with pt who identified her trauma triggers within the past week and coping skills used. Pt reports, "I've made plans to move out within the next 10 days. I've applied for a 3br apt and I feel good about my decision." Cln asked open-ended questions. Cln and pt  explored the next phase of her life: goals, work, relationships, children.  Cln used CPT to help patient focus on how her thinking has been impacted by her trauma and teaches her to take a look at her thoughts and help her  progress toward recovery. Pt reports on her pain that may be in her bladder/kidneys. Cln suggested pt contact her PCP at the [place].    Collaboration of Care: Other: Call and make appt with nephrology due to continued pain  Patient/Guardian was advised Release of Information must be obtained prior to any record release in order to collaborate their care with an outside provider.   Patient/Guardian was advised if they have not already done so to contact the registration department to sign all necessary forms in order for [place] to release information regarding their care.   Consent: Patient/Guardian gives verbal consent for treatment and assignment of benefits for services provided during this visit. Patient/Guardian expressed understanding and agreed to proceed.    Assessment and Plan: Counselor will continue to meet with patient to address treatment plan goals. Patient will continue to follow recommendations of providers and implement skills learned in session.     Follow Up Instructions: I discussed the assessment and treatment plan with the patient. The patient was provided an opportunity to ask questions and all were answered. The patient agreed with the plan and demonstrated an understanding of the instructions.   The patient was advised to call back or seek an in-person evaluation if the symptoms worsen or if the condition fails to improve as anticipated.   I provided 45 minutes of non-face-to-face time during this encounter.  Susan Guerra S, LCAS

## 2022-01-21 ENCOUNTER — Encounter (HOSPITAL_COMMUNITY): Payer: Self-pay | Admitting: Licensed Clinical Social Worker

## 2022-01-21 ENCOUNTER — Ambulatory Visit (INDEPENDENT_AMBULATORY_CARE_PROVIDER_SITE_OTHER): Payer: Medicaid Other | Admitting: Licensed Clinical Social Worker

## 2022-01-21 DIAGNOSIS — F331 Major depressive disorder, recurrent, moderate: Secondary | ICD-10-CM

## 2022-01-21 DIAGNOSIS — F4310 Post-traumatic stress disorder, unspecified: Secondary | ICD-10-CM | POA: Diagnosis not present

## 2022-01-21 NOTE — Progress Notes (Signed)
Virtual Visit via video Note   I connected with Susan Guerra on 01/21/22 at 9:15am by video enabled telemedicine application and verified that I am speaking with the correct person using two identifiers.   Location: Patient: home Provider: home office   I discussed the limitations of evaluation and management by telemedicine and the availability of in person appointments. The patient expressed understanding and agreed to proceed.   History of Present Illness: Pt is referred to therapy for PTSD (MST) and MDD by the [facility]. Her biggest stressor is her 44 yo son who has explosive aspbergers and 2 younger children (15 & 6). She lives with her "husband." Pt has fibromyalgia, which affects her ability to take care of all her family's needs.  She has previous therapy.  Treatment Goal Addressed: Pt will recall the traumatic event without becoming too overwhelmed with emotions 50% of the time, evidenced by self report.  Progression towards Goal: Progressing     Observations/Objective: Patient presented for today's session on time and was alert, oriented x5, with no evidence or self-report of SI/HI or A/V H. Patient reported ongoing compliance with medication and denied any use of alcohol or illicit substances.  Clinician inquired about patient's current emotional ratings, as well as any significant changes in thoughts, feelings or behavior since previous session. Patient's emotional ratings: 6/10 for depression, 6/10 for anxiety, 3/10 for anger/irritability, recalling traumatic event without becoming too overwhelmed with emotions  30% of the time within the past week. All ratings are evidenced by self report. Cln explored trauma emotional rating with pt who identified her trauma triggers within the past week and coping skills used. Pt reports "I've made plans to move out Saturday: rented an apt, talked to the children, talked to her SO, packing, hired a moving co." Cln asked open ended questions  about her decision, where pt shared a lot of her emotional distress and hopes for a new /different future.Cln utilized CBT to address her thought processes, support and confidence in her decisions.      Collaboration of Care: Other: Continue working with Corporate treasurer.  Patient/Guardian was advised Release of Information must be obtained prior to any record release in order to collaborate their care with an outside provider.   Patient/Guardian was advised if they have not already done so to contact the registration department to sign all necessary forms in order for [place] to release information regarding their care.   Consent: Patient/Guardian gives verbal consent for treatment and assignment of benefits for services provided during this visit. Patient/Guardian expressed understanding and agreed to proceed.    Assessment and Plan: Counselor will continue to meet with patient to address treatment plan goals. Patient will continue to follow recommendations of providers and implement skills learned in session.     Follow Up Instructions: I discussed the assessment and treatment plan with the patient. The patient was provided an opportunity to ask questions and all were answered. The patient agreed with the plan and demonstrated an understanding of the instructions.   The patient was advised to call back or seek an in-person evaluation if the symptoms worsen or if the condition fails to improve as anticipated.   I provided 45 minutes of non-face-to-face time during this encounter.     Remmy Crass S, LCAS

## 2022-01-28 ENCOUNTER — Ambulatory Visit (INDEPENDENT_AMBULATORY_CARE_PROVIDER_SITE_OTHER): Payer: No Typology Code available for payment source | Admitting: Licensed Clinical Social Worker

## 2022-01-28 ENCOUNTER — Encounter (HOSPITAL_COMMUNITY): Payer: Self-pay | Admitting: Licensed Clinical Social Worker

## 2022-01-28 DIAGNOSIS — F331 Major depressive disorder, recurrent, moderate: Secondary | ICD-10-CM | POA: Diagnosis not present

## 2022-01-28 DIAGNOSIS — F4310 Post-traumatic stress disorder, unspecified: Secondary | ICD-10-CM | POA: Diagnosis not present

## 2022-01-28 NOTE — Progress Notes (Signed)
Virtual Visit via video Note   I connected with Susan Guerra on 01/28/22 at 9:15am by video enabled telemedicine application and verified that I am speaking with the correct person using two identifiers.   Location: Patient: home Provider: home office   I discussed the limitations of evaluation and management by telemedicine and the availability of in person appointments. The patient expressed understanding and agreed to proceed.   History of Present Illness: Pt is referred to therapy for PTSD (MST) and MDD by the [facility]. Her biggest stressor is her 44 yo son who has explosive aspbergers and 2 younger children (15 & 6). She lives with her "husband." Pt has fibromyalgia, which affects her ability to take care of all her family's needs.  She has previous therapy.  Treatment Goal Addressed: Pt will recall the traumatic event without becoming too overwhelmed with emotions 50% of the time, evidenced by self report.  Progression towards Goal: Progressing     Observations/Objective: Patient presented for today's session on time and was alert, oriented x5, with no evidence or self-report of SI/HI or A/V H. Patient reported ongoing compliance with medication and denied any use of alcohol or illicit substances.  Clinician inquired about patient's current emotional ratings, as well as any significant changes in thoughts, feelings or behavior since previous session. Patient's emotional ratings: 510 for depression, 5/10 for anxiety, 2/10 for anger/irritability, recalling traumatic event without becoming too overwhelmed with emotions  30% of the time within the past week. All ratings are evidenced by self report. Cln explored trauma emotional rating with pt who identified her trauma triggers within the past week and coping skills used. Pt reports "I moved out of the family home into my own apt."  Cln asked open ended questions. "It was a difficult move, both emotionally and physically. I'm trying to  figure things out how to manage my life, move, the kids, work, emotions. Clinician utilized CBT to process concerns  worries and challenges. Clinician processed thoughts, feelings, and behaviors. Clinician discussed coping skills and options for supporting herself through these challenging times.      Collaboration of Care: Other: none needed at this session  Patient/Guardian was advised Release of Information must be obtained prior to any record release in order to collaborate their care with an outside provider.   Patient/Guardian was advised if they have not already done so to contact the registration department to sign all necessary forms in order for [place] to release information regarding their care.   Consent: Patient/Guardian gives verbal consent for treatment and assignment of benefits for services provided during this visit. Patient/Guardian expressed understanding and agreed to proceed.    Assessment and Plan: Counselor will continue to meet with patient to address treatment plan goals. Patient will continue to follow recommendations of providers and implement skills learned in session.     Follow Up Instructions: I discussed the assessment and treatment plan with the patient. The patient was provided an opportunity to ask questions and all were answered. The patient agreed with the plan and demonstrated an understanding of the instructions.   The patient was advised to call back or seek an in-person evaluation if the symptoms worsen or if the condition fails to improve as anticipated.   I provided 45 minutes of non-face-to-face time during this encounter.     Dmonte Maher S, LCAS

## 2022-02-04 ENCOUNTER — Ambulatory Visit (HOSPITAL_COMMUNITY): Payer: Medicaid Other | Admitting: Licensed Clinical Social Worker

## 2022-02-05 ENCOUNTER — Ambulatory Visit (INDEPENDENT_AMBULATORY_CARE_PROVIDER_SITE_OTHER): Payer: No Typology Code available for payment source | Admitting: Licensed Clinical Social Worker

## 2022-02-05 ENCOUNTER — Encounter (HOSPITAL_COMMUNITY): Payer: Self-pay | Admitting: Licensed Clinical Social Worker

## 2022-02-05 DIAGNOSIS — F331 Major depressive disorder, recurrent, moderate: Secondary | ICD-10-CM | POA: Diagnosis not present

## 2022-02-05 DIAGNOSIS — F4310 Post-traumatic stress disorder, unspecified: Secondary | ICD-10-CM | POA: Diagnosis not present

## 2022-02-05 NOTE — Progress Notes (Signed)
Virtual Visit via video Note   I connected with Susan Guerra on 02/05/22 at 10:15am by video enabled telemedicine application and verified that I am speaking with the correct person using two identifiers.   Location: Patient: home Provider: home office   I discussed the limitations of evaluation and management by telemedicine and the availability of in person appointments. The patient expressed understanding and agreed to proceed.   History of Present Illness: Pt is referred to therapy for PTSD (MST) and MDD by the [facility]. Her biggest stressor is her 44 yo son who has explosive aspbergers and 2 younger children (15 & 6). She lives with her "husband." Pt has fibromyalgia, which affects her ability to take care of all her family's needs.  She has previous therapy.  Treatment Goal Addressed: Pt will recall the traumatic event without becoming too overwhelmed with emotions 50% of the time, evidenced by self report.  Progression towards Goal: Progressing     Observations/Objective: Patient presented for today's session on time and was alert, oriented x5, with no evidence or self-report of SI/HI or A/V H. Patient reported ongoing compliance with medication and denied any use of alcohol or illicit substances.  Clinician inquired about patient's current emotional ratings, as well as any significant changes in thoughts, feelings or behavior since previous session. Patient's emotional ratings: 510 for depression, 5/10 for anxiety, 3/10 for anger/irritability, recalling traumatic event without becoming too overwhelmed with emotions 30% of the time within the past week. All ratings are evidenced by self report. Cln explored trauma emotional rating with pt who identified her trauma triggers within the past week and coping skills used. 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 I'm still trying to figure things out how to manage my life, unpack, work, emotions.  Clinician utilized CBT to process concerns  worries and challenges. Clinician processed thoughts, feelings, and behaviors. Clinician discussed coping skills and options for supporting herself through these challenging times. "I need to work on, why I look to others (outside of my relationship) to have my needs fulfilled." Cln and pt explored this and will continue at next session.      Collaboration of Care: Other: none needed at this session  Patient/Guardian was advised Release of Information must be obtained prior to any record release in order to collaborate their care with an outside provider.   Patient/Guardian was advised if they have not already done so to contact the registration department to sign all necessary forms in order for [place] to release information regarding their care.   Consent: Patient/Guardian gives verbal consent for treatment and assignment of benefits for services provided during this visit. Patient/Guardian expressed understanding and agreed to proceed.    Assessment and Plan: Counselor will continue to meet with patient to address treatment plan goals. Patient will continue to follow recommendations of providers and implement skills learned in session.     Follow Up Instructions: I discussed the assessment and treatment plan with the patient. The patient was provided an opportunity to ask questions and all were answered. The patient agreed with the plan and demonstrated an understanding of the instructions.   The patient was advised to call back or seek an in-person evaluation if the symptoms worsen or if the condition fails to improve as anticipated.   I provided 45 minutes of non-face-to-face time during this encounter.     Adriaan Maltese S, LCAS

## 2022-02-11 ENCOUNTER — Ambulatory Visit (HOSPITAL_COMMUNITY): Payer: Medicaid Other | Admitting: Licensed Clinical Social Worker

## 2022-02-12 ENCOUNTER — Ambulatory Visit (INDEPENDENT_AMBULATORY_CARE_PROVIDER_SITE_OTHER): Payer: No Typology Code available for payment source | Admitting: Licensed Clinical Social Worker

## 2022-02-12 ENCOUNTER — Encounter (HOSPITAL_COMMUNITY): Payer: Self-pay | Admitting: Licensed Clinical Social Worker

## 2022-02-12 DIAGNOSIS — F331 Major depressive disorder, recurrent, moderate: Secondary | ICD-10-CM | POA: Diagnosis not present

## 2022-02-12 DIAGNOSIS — F4310 Post-traumatic stress disorder, unspecified: Secondary | ICD-10-CM | POA: Diagnosis not present

## 2022-02-12 NOTE — Progress Notes (Signed)
Virtual Visit via video Note   I connected with Susan Guerra on 02/12/22 at 2:00 pm by video enabled telemedicine application and verified that I am speaking with the correct person using two identifiers.   Location: Patient: home Provider: home office   I discussed the limitations of evaluation and management by telemedicine and the availability of in person appointments. The patient expressed understanding and agreed to proceed.   History of Present Illness: Pt is referred to therapy for PTSD (MST) and MDD by the [facility]. Her biggest stressors are her health, taking care of her children (15 & 6) transitioning out of her 12 year relationship. Pt has fibromyalgia and other health issues, which affects her ability to take care of all her family's needs.  She has previous therapy.  Treatment Goal Addressed: Pt will recall the traumatic event without becoming too overwhelmed with emotions 50% of the time, evidenced by self report.  Progression towards Goal: Progressing     Observations/Objective: Patient presented for today's session on time and was alert, oriented x5, with no evidence or self-report of SI/HI or A/V H. Patient reported ongoing compliance with medication and denied any use of alcohol or illicit substances.  Clinician inquired about patient's current emotional ratings, as well as any significant changes in thoughts, feelings or behavior since previous session. Patient's emotional ratings: 810 for depression, 8/10 for anxiety, 5/10 for anger/irritability, recalling traumatic event without becoming too overwhelmed with emotions 50% of the time within the past week. All ratings are evidenced by self report. Cln explored trauma emotional rating with pt who identified her trauma triggers within the past week and coping skills used. Pt reports tearfully, "I'm still exhausted, overwhelmed, and sad.  I'm still trying to figure things out how to manage my life, unpack, work,  emotions. My father has decided he is stopping dialalysis, which means he'll have a slow death probably around 3 weeks." Cln asked open-ended questions. Cln and pt explored the stages of grief, even before the death. "I don't know how I'll get through this death, especially after losing my mother."Clinician utilized CBT to process concerns  and challenges. Clinician processed thoughts, feelings, and behaviors. Clinician discussed coping skills and options for supporting herself through these challenging times.       Collaboration of Care: Other: none needed at this session  Patient/Guardian was advised Release of Information must be obtained prior to any record release in order to collaborate their care with an outside provider.   Patient/Guardian was advised if they have not already done so to contact the registration department to sign all necessary forms in order for [place] to release information regarding their care.   Consent: Patient/Guardian gives verbal consent for treatment and assignment of benefits for services provided during this visit. Patient/Guardian expressed understanding and agreed to proceed.    Assessment and Plan: Counselor will continue to meet with patient to address treatment plan goals. Patient will continue to follow recommendations of providers and implement skills learned in session.     Follow Up Instructions: I discussed the assessment and treatment plan with the patient. The patient was provided an opportunity to ask questions and all were answered. The patient agreed with the plan and demonstrated an understanding of the instructions.   The patient was advised to call back or seek an in-person evaluation if the symptoms worsen or if the condition fails to improve as anticipated.   I provided 45 minutes of non-face-to-face time during this encounter.  Baby Stairs S, LCAS

## 2022-02-17 ENCOUNTER — Ambulatory Visit (HOSPITAL_COMMUNITY): Payer: Medicaid Other | Admitting: Licensed Clinical Social Worker

## 2022-02-25 ENCOUNTER — Encounter (HOSPITAL_COMMUNITY): Payer: Self-pay | Admitting: Licensed Clinical Social Worker

## 2022-02-25 ENCOUNTER — Ambulatory Visit (INDEPENDENT_AMBULATORY_CARE_PROVIDER_SITE_OTHER): Payer: No Typology Code available for payment source | Admitting: Licensed Clinical Social Worker

## 2022-02-25 DIAGNOSIS — F4310 Post-traumatic stress disorder, unspecified: Secondary | ICD-10-CM | POA: Diagnosis not present

## 2022-02-25 DIAGNOSIS — F331 Major depressive disorder, recurrent, moderate: Secondary | ICD-10-CM | POA: Diagnosis not present

## 2022-02-25 NOTE — Progress Notes (Signed)
Virtual Visit via video Note   I connected with Susan Guerra on 02/25/22 at 9:15 am by video enabled telemedicine application and verified that I am speaking with the correct person using two identifiers.   Location: Patient: home Provider: home office   I discussed the limitations of evaluation and management by telemedicine and the availability of in person appointments. The patient expressed understanding and agreed to proceed.   History of Present Illness: Pt is referred to therapy for PTSD (MST) and MDD by the [facility]. Her biggest stressors are her health, taking care of her children (15 & 6) transitioning out of her 12 year relationship. Pt has fibromyalgia and other health issues, which affects her ability to take care of all her family's needs.  She has previous therapy.  Treatment Goal Addressed: Pt will recall the traumatic event without becoming too overwhelmed with emotions 50% of the time, evidenced by self report.  Progression towards Goal: Progressing     Observations/Objective: Patient presented for today's session on time and was alert, oriented x5, with no evidence or self-report of SI/HI or A/V H. Patient reported ongoing compliance with medication and denied any use of alcohol or illicit substances.  Clinician inquired about patient's current emotional ratings, as well as any significant changes in thoughts, feelings or behavior since previous session. Patient's emotional ratings: 6/10 for depression, 6/10 for anxiety, 3/10 for anger/irritability, recalling traumatic event without becoming too overwhelmed with emotions 50% of the time within the past week. All ratings are evidenced by self report. Cln explored trauma emotional rating with pt who identified her trauma triggers within the past week and coping skills used. Pt reports tearfully, "My father passed away yesterday. I was with him all last week as he was passing, along with my siblings and other relatives."  Cln reviewed the stages of grief and provided education on depression vs grief. Clinician utilized CBT to process concerns  worries and challenges of her father's passing and funeral planning. Clinician processed thoughts, feelings, and behaviors. Clinician discussed coping skills and options for supporting herself through these challenging times.        Collaboration of Care: Other: none needed at this session  Patient/Guardian was advised Release of Information must be obtained prior to any record release in order to collaborate their care with an outside provider.   Patient/Guardian was advised if they have not already done so to contact the registration department to sign all necessary forms in order for [place] to release information regarding their care.   Consent: Patient/Guardian gives verbal consent for treatment and assignment of benefits for services provided during this visit. Patient/Guardian expressed understanding and agreed to proceed.    Assessment and Plan: Counselor will continue to meet with patient to address treatment plan goals. Patient will continue to follow recommendations of providers and implement skills learned in session.     Follow Up Instructions: I discussed the assessment and treatment plan with the patient. The patient was provided an opportunity to ask questions and all were answered. The patient agreed with the plan and demonstrated an understanding of the instructions.   The patient was advised to call back or seek an in-person evaluation if the symptoms worsen or if the condition fails to improve as anticipated.   I provided 45 minutes of non-face-to-face time during this encounter.     Susan Guerra S, LCAS

## 2022-03-04 ENCOUNTER — Ambulatory Visit (INDEPENDENT_AMBULATORY_CARE_PROVIDER_SITE_OTHER): Payer: No Typology Code available for payment source | Admitting: Licensed Clinical Social Worker

## 2022-03-04 ENCOUNTER — Encounter (HOSPITAL_COMMUNITY): Payer: Self-pay | Admitting: Licensed Clinical Social Worker

## 2022-03-04 DIAGNOSIS — F331 Major depressive disorder, recurrent, moderate: Secondary | ICD-10-CM

## 2022-03-04 DIAGNOSIS — F4310 Post-traumatic stress disorder, unspecified: Secondary | ICD-10-CM

## 2022-03-04 NOTE — Progress Notes (Signed)
Virtual Visit via video Note   I connected with Susan Guerra on 03/04/22 at 9:15 am by video enabled telemedicine application and verified that I am speaking with the correct person using two identifiers.   Location: Patient: home Provider: home office   I discussed the limitations of evaluation and management by telemedicine and the availability of in person appointments. The patient expressed understanding and agreed to proceed.   History of Present Illness: Pt is referred to therapy for PTSD (MST) and MDD by the [facility]. Her biggest stressors are her health, taking care of her children (15 & 6) transitioning out of her 12 year relationship. Pt has fibromyalgia and other health issues, which affects her ability to take care of all her family's needs.  She has previous therapy.  Treatment Goal Addressed: Pt will recall the traumatic event without becoming too overwhelmed with emotions 50% of the time, evidenced by self report.  Progression towards Goal: Progressing     Observations/Objective: Patient presented for today's session on time and was alert, oriented x5, with no evidence or self-report of SI/HI or A/V H. Patient reported ongoing compliance with medication and denied any use of alcohol or illicit substances.  Clinician inquired about patient's current emotional ratings, as well as any significant changes in thoughts, feelings or behavior since previous session. Patient's emotional ratings: 6/10 for depression, 6/10 for anxiety, 3/10 for anger/irritability, recalling traumatic event without becoming too overwhelmed with emotions 50% of the time within the past week. All ratings are evidenced by self report. Cln explored trauma emotional rating with pt who identified her trauma triggers within the past week and coping skills used. Pt reports tearfully, "My father's funeral was yesterday. I barely made it the grief is overwhelming. I have extended family and siblings who have  been supportive along with my children. Again, Cln reviewed the stages of grief and provided education on depression vs grief. Clinician utilized CBT to process concerns  worries and challenges of her father's passing and funeral. Clinician processed thoughts, feelings, and behaviors. Clinician discussed coping skills and options for supporting herself through these challenging times. Cln encouraged pt to contact [facility] for grief counseling.       Collaboration of Care: Other: Encouraged pt to contact [facility] for grief counseling  Patient/Guardian was advised Release of Information must be obtained prior to any record release in order to collaborate their care with an outside provider.   Patient/Guardian was advised if they have not already done so to contact the registration department to sign all necessary forms in order for [place] to release information regarding their care.   Consent: Patient/Guardian gives verbal consent for treatment and assignment of benefits for services provided during this visit. Patient/Guardian expressed understanding and agreed to proceed.    Assessment and Plan: Counselor will continue to meet with patient to address treatment plan goals. Patient will continue to follow recommendations of providers and implement skills learned in session.     Follow Up Instructions: I discussed the assessment and treatment plan with the patient. The patient was provided an opportunity to ask questions and all were answered. The patient agreed with the plan and demonstrated an understanding of the instructions.   The patient was advised to call back or seek an in-person evaluation if the symptoms worsen or if the condition fails to improve as anticipated.   I provided 45 minutes of non-face-to-face time during this encounter.     Lezli Danek S, LCAS

## 2022-03-11 ENCOUNTER — Ambulatory Visit (HOSPITAL_COMMUNITY): Payer: Medicaid Other | Admitting: Licensed Clinical Social Worker

## 2022-03-18 ENCOUNTER — Ambulatory Visit (INDEPENDENT_AMBULATORY_CARE_PROVIDER_SITE_OTHER): Payer: No Typology Code available for payment source | Admitting: Licensed Clinical Social Worker

## 2022-03-18 ENCOUNTER — Encounter (HOSPITAL_COMMUNITY): Payer: Self-pay | Admitting: Licensed Clinical Social Worker

## 2022-03-18 DIAGNOSIS — F4310 Post-traumatic stress disorder, unspecified: Secondary | ICD-10-CM | POA: Diagnosis not present

## 2022-03-18 DIAGNOSIS — F331 Major depressive disorder, recurrent, moderate: Secondary | ICD-10-CM

## 2022-03-18 NOTE — Progress Notes (Signed)
Virtual Visit via video Note   I connected with Susan Guerra on 03/18/22 at 9:15 am by video enabled telemedicine application and verified that I am speaking with the correct person using two identifiers.   Location: Patient: home Provider: home office   I discussed the limitations of evaluation and management by telemedicine and the availability of in person appointments. The patient expressed understanding and agreed to proceed.   History of Present Illness: Pt is referred to therapy for PTSD (MST) and MDD by the [facility]. Her biggest stressors are her health, taking care of her children (15 & 6) transitioning out of her 12 year relationship. Pt has fibromyalgia and other health issues, which affects her ability to take care of all her family's needs.  She has previous therapy.  Treatment Goal Addressed: Pt will recall the traumatic event without becoming too overwhelmed with emotions 50% of the time, evidenced by self report.  Progression towards Goal: Progressing     Observations/Objective: Patient presented for today's session on time and was alert, oriented x5, with no evidence or self-report of SI/HI or A/V H. Patient reported ongoing compliance with medication and denied any use of alcohol or illicit substances.  Clinician inquired about patient's current emotional ratings, as well as any significant changes in thoughts, feelings or behavior since previous session. Patient's emotional ratings: 6/10 for depression, 6/10 for anxiety, 3/10 for anger/irritability, recalling traumatic event without becoming too overwhelmed with emotions 40% of the time within the past week. All ratings are evidenced by self report. Cln explored trauma emotional rating with pt who identified her trauma triggers within the past week and coping skills used. Pt reports "My x-relationship is making my life difficult (children, financial, lack of problem solving.)  Clinician utilized CBT to process concerns   and challenges..Clinician processed thoughts, feelings, and behaviors. Clinician discussed coping skills and options for supporting herself through these challenging times.   Collaboration of Care: Other: Encouraged pt to contact Hospice for grief counseling  Patient/Guardian was advised Release of Information must be obtained prior to any record release in order to collaborate their care with an outside provider.   Patient/Guardian was advised if they have not already done so to contact the registration department to sign all necessary forms in order for [place] to release information regarding their care.   Consent: Patient/Guardian gives verbal consent for treatment and assignment of benefits for services provided during this visit. Patient/Guardian expressed understanding and agreed to proceed.    Assessment and Plan: Counselor will continue to meet with patient to address treatment plan goals. Patient will continue to follow recommendations of providers and implement skills learned in session.     Follow Up Instructions: I discussed the assessment and treatment plan with the patient. The patient was provided an opportunity to ask questions and all were answered. The patient agreed with the plan and demonstrated an understanding of the instructions.   The patient was advised to call back or seek an in-person evaluation if the symptoms worsen or if the condition fails to improve as anticipated.   I provided 45 minutes of non-face-to-face time during this encounter.     Connor Foxworthy S, LCAS

## 2022-03-25 ENCOUNTER — Encounter (HOSPITAL_COMMUNITY): Payer: Self-pay | Admitting: Licensed Clinical Social Worker

## 2022-03-25 ENCOUNTER — Ambulatory Visit (INDEPENDENT_AMBULATORY_CARE_PROVIDER_SITE_OTHER): Payer: No Typology Code available for payment source | Admitting: Licensed Clinical Social Worker

## 2022-03-25 DIAGNOSIS — F331 Major depressive disorder, recurrent, moderate: Secondary | ICD-10-CM | POA: Diagnosis not present

## 2022-03-25 DIAGNOSIS — F4310 Post-traumatic stress disorder, unspecified: Secondary | ICD-10-CM

## 2022-03-25 NOTE — Progress Notes (Signed)
Virtual Visit via video Note   I connected with Susan Guerra on 03/25/22 at 9:15 am by video enabled telemedicine application and verified that I am speaking with the correct person using two identifiers.   Location: Patient: home Provider: home office   I discussed the limitations of evaluation and management by telemedicine and the availability of in person appointments. The patient expressed understanding and agreed to proceed.   History of Present Illness: Pt is referred to therapy for PTSD (MST) and MDD by the [facility]. Her biggest stressors are her health, taking care of her children (15 & 6) transitioning out of her 12 year relationship. Pt has fibromyalgia and other health issues, which affects her ability to take care of all her family's needs.  She has previous therapy.  Treatment Goal Addressed: Pt will recall the traumatic event without becoming too overwhelmed with emotions 50% of the time, evidenced by self report.  Progression towards Goal: Progressing     Observations/Objective: Patient presented for today's session on time and was alert, oriented x5, with no evidence or self-report of SI/HI or A/V H. Patient reported ongoing compliance with medication and denied any use of alcohol or illicit substances.  Clinician inquired about patient's current emotional ratings, as well as any significant changes in thoughts, feelings or behavior since previous session. Patient's emotional ratings: 6/10 for depression, 6/10 for anxiety, 3/10 for anger/irritability, recalling traumatic event without becoming too overwhelmed with emotions 40% of the time within the past week. All ratings are evidenced by self report. Cln explored trauma emotional rating with pt who identified her trauma triggers within the past week and coping skills used. Pt reports "My x-relationship continues to make my life difficult (children, financial, lack of problem solving.) Cln provided education on setting  boundaries, role-played during session. Cln encouraged pt to use boundaries with her x-SO. Clinician utilized CBT to process concerns  and challenges..Clinician processed thoughts, feelings, and behaviors. Clinician discussed coping skills and options for supporting herself through these challenging times.   Collaboration of Care: Other: Encouraged pt to contact Hospice for grief counseling  Patient/Guardian was advised Release of Information must be obtained prior to any record release in order to collaborate their care with an outside provider.   Patient/Guardian was advised if they have not already done so to contact the registration department to sign all necessary forms in order for [place] to release information regarding their care.   Consent: Patient/Guardian gives verbal consent for treatment and assignment of benefits for services provided during this visit. Patient/Guardian expressed understanding and agreed to proceed.    Assessment and Plan: Counselor will continue to meet with patient to address treatment plan goals. Patient will continue to follow recommendations of providers and implement skills learned in session.     Follow Up Instructions: I discussed the assessment and treatment plan with the patient. The patient was provided an opportunity to ask questions and all were answered. The patient agreed with the plan and demonstrated an understanding of the instructions.   The patient was advised to call back or seek an in-person evaluation if the symptoms worsen or if the condition fails to improve as anticipated.   I provided 45 minutes of non-face-to-face time during this encounter.     Suzanna Zahn S, LCAS

## 2022-04-01 ENCOUNTER — Ambulatory Visit (HOSPITAL_COMMUNITY): Payer: Medicaid Other | Admitting: Licensed Clinical Social Worker

## 2022-04-08 ENCOUNTER — Encounter (HOSPITAL_COMMUNITY): Payer: Self-pay | Admitting: Licensed Clinical Social Worker

## 2022-04-08 ENCOUNTER — Ambulatory Visit (INDEPENDENT_AMBULATORY_CARE_PROVIDER_SITE_OTHER): Payer: No Typology Code available for payment source | Admitting: Licensed Clinical Social Worker

## 2022-04-08 DIAGNOSIS — F331 Major depressive disorder, recurrent, moderate: Secondary | ICD-10-CM | POA: Diagnosis not present

## 2022-04-08 DIAGNOSIS — F4310 Post-traumatic stress disorder, unspecified: Secondary | ICD-10-CM

## 2022-04-08 NOTE — Progress Notes (Signed)
Virtual Visit via video Note   I connected with Susan Guerra on 04/08/22 at 9:15 am by video enabled telemedicine application and verified that I am speaking with the correct person using two identifiers.   Location: Patient: home Provider: home office   I discussed the limitations of evaluation and management by telemedicine and the availability of in person appointments. The patient expressed understanding and agreed to proceed.   History of Present Illness: Pt is referred to therapy for PTSD (MST) and MDD by the [facility]. Her biggest stressors are her health, taking care of her children (15 & 6) transitioning out of her 12 year relationship. Pt has fibromyalgia and other health issues, which affects her ability to take care of all her family's needs.  She has previous therapy.  Treatment Goal Addressed: Pt will recall the traumatic event without becoming too overwhelmed with emotions 50% of the time, evidenced by self report.  Progression towards Goal: Progressing     Observations/Objective: Patient presented for today's session on time and was alert, oriented x5, with no evidence or self-report of SI/HI or A/V H. Patient reported ongoing compliance with medication and denied any use of alcohol or illicit substances.  Clinician inquired about patient's current emotional ratings, as well as any significant changes in thoughts, feelings or behavior since previous session. Patient's emotional ratings: 6/10 for depression, 6/10 for anxiety, 3/10 for anger/irritability, recalling traumatic event without becoming too overwhelmed with emotions 40% of the time within the past week. All ratings are evidenced by self report. 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 Clinician utilized CBT to process concerns  and challenges..Clinician processed thoughts, feelings, and behaviors. Clinician discussed coping skills and options for supporting herself through these challenging times.   Collaboration of Care: Other: Encouraged pt to contact Hospice for grief counseling  Patient/Guardian was advised Release of Information must be obtained prior to any record release in order to collaborate their care with an outside provider.   Patient/Guardian was advised if they have not already done so to contact the registration department to sign all necessary forms in order for [place] to release information regarding their care.   Consent: Patient/Guardian gives verbal consent for treatment and assignment of benefits for services provided during this visit. Patient/Guardian expressed understanding and agreed to proceed.    Assessment and Plan: Counselor will continue to meet with patient to address treatment plan goals. Patient will continue to follow recommendations of providers and implement skills learned in session.     Follow Up Instructions: I discussed the assessment and treatment plan with the patient. The patient was provided an opportunity to ask questions and all were answered. The patient agreed with the plan and demonstrated an understanding of the instructions.   The patient was advised to call back or seek an in-person evaluation if the symptoms worsen or if the condition fails to improve as anticipated.   I provided 45 minutes of non-face-to-face time during this encounter.     Chukwuemeka Artola S, LCAS

## 2022-04-15 ENCOUNTER — Encounter (HOSPITAL_COMMUNITY): Payer: Self-pay | Admitting: Licensed Clinical Social Worker

## 2022-04-15 ENCOUNTER — Ambulatory Visit (INDEPENDENT_AMBULATORY_CARE_PROVIDER_SITE_OTHER): Payer: No Typology Code available for payment source | Admitting: Licensed Clinical Social Worker

## 2022-04-15 DIAGNOSIS — F331 Major depressive disorder, recurrent, moderate: Secondary | ICD-10-CM | POA: Diagnosis not present

## 2022-04-15 DIAGNOSIS — F4310 Post-traumatic stress disorder, unspecified: Secondary | ICD-10-CM

## 2022-04-22 ENCOUNTER — Ambulatory Visit (INDEPENDENT_AMBULATORY_CARE_PROVIDER_SITE_OTHER): Payer: No Typology Code available for payment source | Admitting: Licensed Clinical Social Worker

## 2022-04-22 ENCOUNTER — Encounter (HOSPITAL_COMMUNITY): Payer: Self-pay | Admitting: Licensed Clinical Social Worker

## 2022-04-22 DIAGNOSIS — F4310 Post-traumatic stress disorder, unspecified: Secondary | ICD-10-CM | POA: Diagnosis not present

## 2022-04-22 DIAGNOSIS — F331 Major depressive disorder, recurrent, moderate: Secondary | ICD-10-CM | POA: Diagnosis not present

## 2022-04-22 NOTE — Progress Notes (Signed)
Virtual Visit via video Note   I connected with Susan Guerra on 04/22/22 at 9:15 am by video enabled telemedicine application and verified that I am speaking with the correct person using two identifiers.   Location: Patient: home Provider: home office   I discussed the limitations of evaluation and management by telemedicine and the availability of in person appointments. The patient expressed understanding and agreed to proceed.   History of Present Illness: Pt is referred to therapy for PTSD (MST) and MDD by the [facility]. Her biggest stressors are her health, taking care of her children (15 & 6) transitioning out of her 12 year relationship. Pt has fibromyalgia and other health issues, which affects her ability to take care of all her family's needs.  She has previous therapy.  Treatment Goal Addressed: Pt will recall the traumatic event without becoming too overwhelmed with emotions 50% of the time, evidenced by self report.  Progression towards Goal: Progressing     Observations/Objective: Patient presented for today's session on time and was alert, oriented x5, with no evidence or self-report of SI/HI or A/V H. Patient reported ongoing compliance with medication and denied any use of alcohol or illicit substances.  Clinician inquired about patient's current emotional ratings, as well as any significant changes in thoughts, feelings or behavior since previous session. Patient's emotional ratings: 7/10 for depression, 7/10 for anxiety, 3/10 for anger/irritability, recalling traumatic event without becoming too overwhelmed with emotions 50% of the time within the past week. All ratings are evidenced by self report. Cln explored trauma emotional ratings with pt who identified her trauma triggers within the past week and coping skills used. Pt provided updates on health, x-relationship, moods, work, family. Cln used CBT to assist in identifying positives to assess how her mood has been  impacted. Clinician allowed space for patient to further process emotions and clinician provided supportive statements throughout session. It was suggested to patient to continue to identify positives that  impact mood daily. Cln provided education on how to to recognize the physical, cognitive, emotional, and behavioral responses that influence her mood.    Collaboration of Care: Other: Encouraged pt to contact Hospice for grief counseling  Patient/Guardian was advised Release of Information must be obtained prior to any record release in order to collaborate their care with an outside provider.   Patient/Guardian was advised if they have not already done so to contact the registration department to sign all necessary forms in order for [place] to release information regarding their care.   Consent: Patient/Guardian gives verbal consent for treatment and assignment of benefits for services provided during this visit. Patient/Guardian expressed understanding and agreed to proceed.    Assessment and Plan: Counselor will continue to meet with patient to address treatment plan goals. Patient will continue to follow recommendations of providers and implement skills learned in session.     Follow Up Instructions: I discussed the assessment and treatment plan with the patient. The patient was provided an opportunity to ask questions and all were answered. The patient agreed with the plan and demonstrated an understanding of the instructions.   The patient was advised to call back or seek an in-person evaluation if the symptoms worsen or if the condition fails to improve as anticipated.   I provided 45 minutes of non-face-to-face time during this encounter.     Randi College S, LCAS

## 2022-04-29 ENCOUNTER — Ambulatory Visit (INDEPENDENT_AMBULATORY_CARE_PROVIDER_SITE_OTHER): Payer: No Typology Code available for payment source | Admitting: Licensed Clinical Social Worker

## 2022-04-29 ENCOUNTER — Encounter (HOSPITAL_COMMUNITY): Payer: Self-pay | Admitting: Licensed Clinical Social Worker

## 2022-04-29 DIAGNOSIS — F331 Major depressive disorder, recurrent, moderate: Secondary | ICD-10-CM | POA: Diagnosis not present

## 2022-04-29 DIAGNOSIS — F4310 Post-traumatic stress disorder, unspecified: Secondary | ICD-10-CM

## 2022-04-29 NOTE — Progress Notes (Signed)
Virtual Visit via video Note   I connected with Susan Guerra on 04/29/22 at 9:15 am by video enabled telemedicine application and verified that I am speaking with the correct person using two identifiers.   Location: Patient: home Provider: home office   I discussed the limitations of evaluation and management by telemedicine and the availability of in person appointments. The patient expressed understanding and agreed to proceed.   History of Present Illness: Pt is referred to therapy for PTSD (MST) and MDD by the [facility]. Her biggest stressors are her health, taking care of her children (15 & 6) transitioning out of her 12 year relationship. Pt has fibromyalgia and other health issues, which affects her ability to take care of all her family's needs.  She has previous therapy.  Treatment Goals Addressed: Pt will recall the traumatic event without becoming too overwhelmed with emotions 50% of the time, evidenced by self report. Meet with clinician in person weekly for therapy to monitor for progress towards goals and address any barriers to success; Reduce depression from average severity level of 6/10 down to a 4/10 in next 90 days by engaging in 3-5 positive coping skills for 1-2 hours per day as part of developing self-care routine; Reduce average anxiety level from 7/10 down to 5/10 in next 90 days by utilizing 3-5 relaxation skills/grounding skills per day, such as mindful breathing, progressive muscle relaxation, positive visualizations.  Progression towards Goal: Progressing     Observations/Objective: Patient presented for today's session on time and was alert, oriented x5, with no evidence or self-report of SI/HI or A/V H. Patient reported ongoing compliance with medication and denied any use of alcohol or illicit substances.  Clinician inquired about patient's current emotional ratings, as well as any significant changes in thoughts, feelings or behavior since previous  session. Patient's emotional ratings: 5/10 for depression, 5/10 for anxiety, 2/10 for anger/irritability, recalling traumatic event without becoming too overwhelmed with emotions 30% of the time within the past week. All ratings are evidenced by self report. Cln explored trauma emotional ratings with pt who identified her trauma triggers within the past week and coping skills used. Cln reviewed tx plan where pt verbalized acceptance of the plan. Pt provided updates on health, x-relationship, moods, work, family."My moods are much better this week, better than in a long time."  Cln used CBT to assist in identifying positives to assess how her mood has been impacted. Clinician allowed space for patient to further process emotions and clinician provided supportive statements throughout session. Cln  suggested to patient to continue to identify positives that  impact mood daily. Cln provided education on how to to recognize the physical, cognitive, emotional, and behavioral responses that influence her mood.    Collaboration of Care: Other: Encouraged pt to contact Hospice for grief counseling  Patient/Guardian was advised Release of Information must be obtained prior to any record release in order to collaborate their care with an outside provider.   Patient/Guardian was advised if they have not already done so to contact the registration department to sign all necessary forms in order for [place] to release information regarding their care.   Consent: Patient/Guardian gives verbal consent for treatment and assignment of benefits for services provided during this visit. Patient/Guardian expressed understanding and agreed to proceed.    Assessment and Plan: Counselor will continue to meet with patient to address treatment plan goals. Patient will continue to follow recommendations of providers and implement skills learned in session.  Follow Up Instructions: I discussed the assessment and treatment plan  with the patient. The patient was provided an opportunity to ask questions and all were answered. The patient agreed with the plan and demonstrated an understanding of the instructions.   The patient was advised to call back or seek an in-person evaluation if the symptoms worsen or if the condition fails to improve as anticipated.   I provided 45 minutes of non-face-to-face time during this encounter.     Susan Guerra S, LCAS

## 2022-05-06 ENCOUNTER — Ambulatory Visit (INDEPENDENT_AMBULATORY_CARE_PROVIDER_SITE_OTHER): Payer: No Typology Code available for payment source | Admitting: Licensed Clinical Social Worker

## 2022-05-06 ENCOUNTER — Encounter (HOSPITAL_COMMUNITY): Payer: Self-pay | Admitting: Licensed Clinical Social Worker

## 2022-05-06 DIAGNOSIS — F331 Major depressive disorder, recurrent, moderate: Secondary | ICD-10-CM | POA: Diagnosis not present

## 2022-05-06 DIAGNOSIS — F4310 Post-traumatic stress disorder, unspecified: Secondary | ICD-10-CM

## 2022-05-06 NOTE — Progress Notes (Signed)
Virtual Visit via video Note   I connected with Susan Guerra on 05/06/22 at 9:15 am by video enabled telemedicine application and verified that I am speaking with the correct person using two identifiers.   Location: Patient: home Provider: home office   I discussed the limitations of evaluation and management by telemedicine and the availability of in person appointments. The patient expressed understanding and agreed to proceed.   History of Present Illness: Pt is referred to therapy for PTSD (MST) and MDD by the [facility]. Her biggest stressors are her health, taking care of her children (15 & 6) transitioning out of her 12 year relationship. Pt has fibromyalgia and other health issues, which affects her ability to take care of all her family's needs.  She has previous therapy.  Treatment Goals Addressed: Pt will recall the traumatic event without becoming too overwhelmed with emotions 50% of the time, evidenced by self report. Meet with clinician in person weekly for therapy to monitor for progress towards goals and address any barriers to success; Reduce depression from average severity level of 6/10 down to a 4/10 in next 90 days by engaging in 3-5 positive coping skills for 1-2 hours per day as part of developing self-care routine; Reduce average anxiety level from 7/10 down to 5/10 in next 90 days by utilizing 3-5 relaxation skills/grounding skills per day, such as mindful breathing, progressive muscle relaxation, positive visualizations.  Progression towards Goal: Progressing     Observations/Objective: Patient presented for today's session on time and was alert, oriented x5, with no evidence or self-report of SI/HI or A/V H. Patient reported ongoing compliance with medication and denied any use of alcohol or illicit substances.  Clinician inquired about patient's current emotional ratings, as well as any significant changes in thoughts, feelings or behavior since previous  session. Patient's emotional ratings: 5/10 for depression, 5/10 for anxiety, 2/10 for anger/irritability, recalling traumatic event without becoming too overwhelmed with emotions 40% of the time within the past week. All ratings are evidenced by self report. Cln explored trauma emotional ratings with pt who identified her trauma triggers within the past week and coping skills used. Pt provided updates on health, x-relationship, moods, work, physical health,family."My moods are good but I'm still total exhausted. "[facility], which I haven't received yet."  Cln used CBT to assist in identifying positives to assess how her mood has been impacted. Clinician allowed space for patient to further process emotions and clinician provided supportive statements throughout session. Cln and pt explored how she has few positives and wants to find another outlet in her life other than her children. Cln  suggested to patient to continue to identify positives (goals) that  impact mood daily. Cln provided education on how to to recognize the physical, cognitive, emotional, and behavioral responses that influence her mood.    Collaboration of Care: Other: Encouraged pt to contact Hospice for grief counseling  Patient/Guardian was advised Release of Information must be obtained prior to any record release in order to collaborate their care with an outside provider.   Patient/Guardian was advised if they have not already done so to contact the registration department to sign all necessary forms in order for [place] to release information regarding their care.   Consent: Patient/Guardian gives verbal consent for treatment and assignment of benefits for services provided during this visit. Patient/Guardian expressed understanding and agreed to proceed.    Assessment and Plan: Counselor will continue to meet with patient to  address treatment plan goals. Patient will continue to follow recommendations of providers and  implement skills learned in session.     Follow Up Instructions: I discussed the assessment and treatment plan with the patient. The patient was provided an opportunity to ask questions and all were answered. The patient agreed with the plan and demonstrated an understanding of the instructions.   The patient was advised to call back or seek an in-person evaluation if the symptoms worsen or if the condition fails to improve as anticipated.   I provided 45 minutes of non-face-to-face time during this encounter.     Kammi Hechler S, LCAS

## 2022-05-11 ENCOUNTER — Encounter (HOSPITAL_COMMUNITY): Payer: Self-pay | Admitting: Licensed Clinical Social Worker

## 2022-05-11 NOTE — Progress Notes (Signed)
Virtual Visit via video Note   I connected with Susan Guerra on 04/15/22 at 9:15 am by video enabled telemedicine application and verified that I am speaking with the correct person using two identifiers.   Location: Patient: home Provider: home office   I discussed the limitations of evaluation and management by telemedicine and the availability of in person appointments. The patient expressed understanding and agreed to proceed.   History of Present Illness: Pt is referred to therapy for PTSD (MST) and MDD by the [facility]. Her biggest stressors are her health, taking care of her children (15 & 6) transitioning out of her 12 year relationship. Pt has fibromyalgia and other health issues, which affects her ability to take care of all her family's needs.  She has previous therapy.  Treatment Goal Addressed: Pt will recall the traumatic event without becoming too overwhelmed with emotions 50% of the time, evidenced by self report.  Progression towards Goal: Progressing     Observations/Objective: Patient presented for today's session on time and was alert, oriented x5, with no evidence or self-report of SI/HI or A/V H. Patient reported ongoing compliance with medication and denied any use of alcohol or illicit substances.  Clinician inquired about patient's current emotional ratings, as well as any significant changes in thoughts, feelings or behavior since previous session. Patient's emotional ratings: 5/10 for depression, 6510 for anxiety, 3/10 for anger/irritability, recalling traumatic event without becoming too overwhelmed with emotions 40% of the time within the past week. All ratings are evidenced by self report. Cln explored trauma emotional ratings with pt who identified her trauma triggers within the past week and coping skills used.Pt updates on stressors: health, x relationship, children, finances, job,grief, responsibilities. "I'm just so overwhelmed with all my  responsibilities I have, with limited help." Cln asked open ended questions. "My x is trying to make my life as difficult as possible." CLn provided education on compartmentalization and problem solving. .Clinician utilized CBT to process concerns  and challenges..Clinician processed thoughts, feelings, and behaviors. Clinician discussed coping skills and options for supporting herself.   Collaboration of Care: Other: Encouraged pt to contact Hospice for grief counseling  Patient/Guardian was advised Release of Information must be obtained prior to any record release in order to collaborate their care with an outside provider.   Patient/Guardian was advised if they have not already done so to contact the registration department to sign all necessary forms in order for [place] to release information regarding their care.   Consent: Patient/Guardian gives verbal consent for treatment and assignment of benefits for services provided during this visit. Patient/Guardian expressed understanding and agreed to proceed.    Assessment and Plan: Counselor will continue to meet with patient to address treatment plan goals. Patient will continue to follow recommendations of providers and implement skills learned in session.     Follow Up Instructions: I discussed the assessment and treatment plan with the patient. The patient was provided an opportunity to ask questions and all were answered. The patient agreed with the plan and demonstrated an understanding of the instructions.   The patient was advised to call back or seek an in-person evaluation if the symptoms worsen or if the condition fails to improve as anticipated.   I provided 45 minutes of non-face-to-face time during this encounter.     Amandajo Gonder S, LCAS

## 2022-05-13 ENCOUNTER — Ambulatory Visit (INDEPENDENT_AMBULATORY_CARE_PROVIDER_SITE_OTHER): Payer: No Typology Code available for payment source | Admitting: Licensed Clinical Social Worker

## 2022-05-13 ENCOUNTER — Encounter (HOSPITAL_COMMUNITY): Payer: Self-pay | Admitting: Licensed Clinical Social Worker

## 2022-05-13 DIAGNOSIS — F331 Major depressive disorder, recurrent, moderate: Secondary | ICD-10-CM

## 2022-05-13 DIAGNOSIS — F4310 Post-traumatic stress disorder, unspecified: Secondary | ICD-10-CM | POA: Diagnosis not present

## 2022-05-13 NOTE — Progress Notes (Signed)
Virtual Visit via video Note   I connected with Susan Guerra on 05/13/22 at 9:15 am by video enabled telemedicine application and verified that I am speaking with the correct person using two identifiers.   Location: Patient: home Provider: home office   I discussed the limitations of evaluation and management by telemedicine and the availability of in person appointments. The patient expressed understanding and agreed to proceed.   History of Present Illness: Pt is referred to therapy for PTSD (MST) and MDD by the [facility] Her biggest stressors are her health, taking care of her children (15 & 6) transitioning out of her 12 year relationship. Pt has fibromyalgia and other health issues, which affects her ability to take care of all her family's needs.  She has previous therapy.  Treatment Goals Addressed: Pt will recall the traumatic event without becoming too overwhelmed with emotions 50% of the time, evidenced by self report. Meet with clinician in person weekly for therapy to monitor for progress towards goals and address any barriers to success; Reduce depression from average severity level of 6/10 down to a 4/10 in next 90 days by engaging in 3-5 positive coping skills for 1-2 hours per day as part of developing self-care routine; Reduce average anxiety level from 7/10 down to 5/10 in next 90 days by utilizing 3-5 relaxation skills/grounding skills per day, such as mindful breathing, progressive muscle relaxation, positive visualizations.  Progression towards Goal: Progressing     Observations/Objective: Patient presented for today's session on time and was alert, oriented x5, with no evidence or self-report of SI/HI or A/V H. Patient reported ongoing compliance with medication and denied any use of alcohol or illicit substances.  Clinician inquired about patient's current emotional ratings, as well as any significant changes in thoughts, feelings or behavior since previous  session. Patient's emotional ratings: 5/10 for depression, 5/10 for anxiety, 2/10 for anger/irritability, recalling traumatic event without becoming too overwhelmed with emotions 50% of the time within the past week. All ratings are evidenced by self report. Cln explored trauma emotional ratings with pt who identified her trauma triggers within the past week and coping skills used. Pt provided updates on health, x-relationship, moods, work, physical health,family, lack of sleep ."My moods are good but I'm still total exhausted, still not sleeping. "[facility], but I haven't started using it yet,I just received the Modafanil which is my sleep med. I went 4 days without it."   Cln provided education on sleep hygiene. Cln provided education how to to recognize the physical, cognitive, emotional, and behavioral responses that influence her mood.    Collaboration of Care: Other: Encouraged pt to contact Hospice for grief counseling  Patient/Guardian was advised Release of Information must be obtained prior to any record release in order to collaborate their care with an outside provider.   Patient/Guardian was advised if they have not already done so to contact the registration department to sign all necessary forms in order for [place] to release information regarding their care.   Consent: Patient/Guardian gives verbal consent for treatment and assignment of benefits for services provided during this visit. Patient/Guardian expressed understanding and agreed to proceed.    Assessment and Plan: Counselor will continue to meet with patient to address treatment plan goals. Patient will continue to follow recommendations of providers and implement skills learned in session.     Follow Up Instructions: I discussed the assessment and treatment plan with the patient. The patient was provided an  opportunity to ask questions and all were answered. The patient agreed with the plan and demonstrated an  understanding of the instructions.   The patient was advised to call back or seek an in-person evaluation if the symptoms worsen or if the condition fails to improve as anticipated.   I provided 45 minutes of non-face-to-face time during this encounter.     Francisca Harbuck S, LCAS

## 2022-05-20 ENCOUNTER — Ambulatory Visit (INDEPENDENT_AMBULATORY_CARE_PROVIDER_SITE_OTHER): Payer: No Typology Code available for payment source | Admitting: Licensed Clinical Social Worker

## 2022-05-20 ENCOUNTER — Encounter (HOSPITAL_COMMUNITY): Payer: Self-pay | Admitting: Licensed Clinical Social Worker

## 2022-05-20 DIAGNOSIS — F4310 Post-traumatic stress disorder, unspecified: Secondary | ICD-10-CM | POA: Diagnosis not present

## 2022-05-20 DIAGNOSIS — F331 Major depressive disorder, recurrent, moderate: Secondary | ICD-10-CM

## 2022-05-20 NOTE — Progress Notes (Signed)
Virtual Visit via video Note   I connected with Susan Guerra on 05/20/22 at 9:15 am by video enabled telemedicine application and verified that I am speaking with the correct person using two identifiers.   Location: Patient: home Provider: home office   I discussed the limitations of evaluation and management by telemedicine and the availability of in person appointments. The patient expressed understanding and agreed to proceed.   History of Present Illness: Pt is referred to therapy for PTSD (MST) and MDD by the [facility]. Her biggest stressors are her health, taking care of her children (15 & 6) transitioning out of her 12 year relationship. Pt has fibromyalgia and other health issues, which affects her ability to take care of all her family's needs.  She has previous therapy.  Treatment Goals Addressed: Pt will recall the traumatic event without becoming too overwhelmed with emotions 50% of the time, evidenced by self report. Meet with clinician in person weekly for therapy to monitor for progress towards goals and address any barriers to success; Reduce depression from average severity level of 6/10 down to a 4/10 in next 90 days by engaging in 3-5 positive coping skills for 1-2 hours per day as part of developing self-care routine; Reduce average anxiety level from 7/10 down to 5/10 in next 90 days by utilizing 3-5 relaxation skills/grounding skills per day, such as mindful breathing, progressive muscle relaxation, positive visualizations.  Progression towards Goal: Progressing     Observations/Objective: Patient presented for today's session on time and was alert, oriented x5, with no evidence or self-report of SI/HI or A/V H. Patient reported ongoing compliance with medication and denied any use of alcohol or illicit substances.  Clinician inquired about patient's current emotional ratings, as well as any significant changes in thoughts, feelings or behavior since previous  session. Patient's emotional ratings: 4/10 for depression, 4/10 for anxiety, 2/10 for anger/irritability, recalling traumatic event without becoming too overwhelmed with emotions 50% of the time within the past week. All ratings are evidenced by self report. Cln explored trauma emotional ratings with pt who identified her trauma triggers within the past week and coping skills used. Pt provided updates on health, x-relationship, moods, work, physical health,family, lack of sleep ."My moods have been mixed. "[facility], and I used it over the weekend, the side effects were awful and I've left messages for my dr." I just received the Modafanil which is my sleep med. Again, Cln provided education on sleep hygiene. Cln provided education how to to recognize the physical, cognitive, emotional, and behavioral responses that influence her mood.    Collaboration of Care: Other: Encouraged pt to contact [facility]ospice for grief counseling  Patient/Guardian was advised Release of Information must be obtained prior to any record release in order to collaborate their care with an outside provider.   Patient/Guardian was advised if they have not already done so to contact the registration department to sign all necessary forms in order for [place] to release information regarding their care.   Consent: Patient/Guardian gives verbal consent for treatment and assignment of benefits for services provided during this visit. Patient/Guardian expressed understanding and agreed to proceed.    Plan: .Counselor will be on FMLA for 4-6 weeks. This is the last session with patient until I return. Cln and pt discussed having a temporary therapist while clin is out. "I choose to not have an interim therapist while she is on FMLA. Cln gave pt information for emergency situations. [facility], ED.     Follow Up Instructions: I discussed the assessment and treatment plan with the patient. The patient was provided an opportunity to ask  questions and all were answered. The patient agreed with the plan and demonstrated an understanding of the instructions.   The patient was advised to call back or seek an in-person evaluation if the symptoms worsen or if the condition fails to improve as anticipated.   I provided 45 minutes of non-face-to-face time during this encounter.     Mammie Meras S, LCAS

## 2022-08-05 ENCOUNTER — Ambulatory Visit (INDEPENDENT_AMBULATORY_CARE_PROVIDER_SITE_OTHER): Payer: Medicaid Other | Admitting: Licensed Clinical Social Worker

## 2022-08-05 DIAGNOSIS — F4310 Post-traumatic stress disorder, unspecified: Secondary | ICD-10-CM

## 2022-08-05 DIAGNOSIS — F331 Major depressive disorder, recurrent, moderate: Secondary | ICD-10-CM

## 2022-08-12 ENCOUNTER — Ambulatory Visit (INDEPENDENT_AMBULATORY_CARE_PROVIDER_SITE_OTHER): Payer: Medicaid Other | Admitting: Licensed Clinical Social Worker

## 2022-08-12 DIAGNOSIS — F331 Major depressive disorder, recurrent, moderate: Secondary | ICD-10-CM

## 2022-08-12 DIAGNOSIS — F4310 Post-traumatic stress disorder, unspecified: Secondary | ICD-10-CM

## 2022-08-19 ENCOUNTER — Ambulatory Visit (HOSPITAL_COMMUNITY): Payer: Medicaid Other | Admitting: Licensed Clinical Social Worker

## 2022-08-19 ENCOUNTER — Ambulatory Visit (INDEPENDENT_AMBULATORY_CARE_PROVIDER_SITE_OTHER): Payer: Medicaid Other | Admitting: Licensed Clinical Social Worker

## 2022-08-19 DIAGNOSIS — F331 Major depressive disorder, recurrent, moderate: Secondary | ICD-10-CM | POA: Diagnosis not present

## 2022-08-19 DIAGNOSIS — F4310 Post-traumatic stress disorder, unspecified: Secondary | ICD-10-CM | POA: Diagnosis not present

## 2022-08-20 ENCOUNTER — Encounter (HOSPITAL_COMMUNITY): Payer: Self-pay | Admitting: Licensed Clinical Social Worker

## 2022-08-24 NOTE — Progress Notes (Signed)
Virtual Visit via video Note   I connected with Susan Guerra on 08/05/22 at 2:00pm by video enabled telemedicine application and verified that I am speaking with the correct person using two identifiers.   Location: Patient: home Provider: home office   I discussed the limitations of evaluation and management by telemedicine and the availability of in person appointments. The patient expressed understanding and agreed to proceed.   History of Present Illness: Pt is referred to therapy for PTSD (MST) and MDD by the [facility]. Her biggest stressors are her health, taking care of her children (15 & 6) transitioning out of her 12 year relationship. Pt has fibromyalgia and other health issues, which affects her ability to take care of all her family's needs.  She has previous therapy.  Treatment Goals Addressed: Pt will recall the traumatic event without becoming too overwhelmed with emotions 50% of the time, evidenced by self report. Meet with clinician in person weekly for therapy to monitor for progress towards goals and address any barriers to success; Reduce depression from average severity level of 6/10 down to a 4/10 in next 90 days by engaging in 3-5 positive coping skills for 1-2 hours per day as part of developing self-care routine; Reduce average anxiety level from 7/10 down to 5/10 in next 90 days by utilizing 3-5 relaxation skills/grounding skills per day, such as mindful breathing, progressive muscle relaxation, positive visualizations.  Progression towards Goal: Progressing     Observations/Objective: Patient presented for today's session on time and was alert, oriented x5, with no evidence or self-report of SI/HI or A/V H. Patient reported ongoing compliance with medication and denied any use of alcohol or illicit substances.  Clinician inquired about patient's current emotional ratings, as well as any significant changes in thoughts, feelings or behavior since previous  session. Patient's emotional ratings: 2/10 for depression, 4/10 for anxiety, 2/10 for anger/irritability, recalling traumatic event without becoming too overwhelmed with emotions 50% of the time within the past few weeks. All ratings are evidenced by self report. Cln explored trauma emotional ratings with pt who identified her trauma triggers within the past few weeks and coping skills used. Today is the first day back for therapist who has been on FMLA. Pt provided an update on mental health and physical health while therapist was out. Pt chose not to have an interim therapist but was given resources to use in case of crisis.        Collaboration of Care: Other: Encouraged pt to contact Hospice for grief counseling  Patient/Guardian was advised Release of Information must be obtained prior to any record release in order to collaborate their care with an outside provider.   Patient/Guardian was advised if they have not already done so to contact the registration department to sign all necessary forms in order for [place] to release information regarding their care.   Consent: Patient/Guardian gives verbal consent for treatment and assignment of benefits for services provided during this visit. Patient/Guardian expressed understanding and agreed to proceed.  Assessment and Plan: Counselor will continue to meet with patient to address treatment plan goals. Patient will continue to follow recommendations of providers and implement skills learned in session.        Follow Up Instructions: I discussed the assessment and treatment plan with the patient. The patient was provided an opportunity to ask questions and all were answered. The patient agreed with the plan and demonstrated an understanding of the plan.   The patient was advised  to call back or seek an in-person evaluation if the symptoms worsen or if the condition fails to improve as anticipated.   I provided 45 minutes of non-face-to-face time  during this encounter.     Kerri Asche S, LCAS

## 2022-08-24 NOTE — Progress Notes (Deleted)
Virtual Visit via video Note   I connected with Susan Guerra on 05/20/22 at 9:15 am by video enabled telemedicine application and verified that I am speaking with the correct person using two identifiers.   Location: Patient: home Provider: home office   I discussed the limitations of evaluation and management by telemedicine and the availability of in person appointments. The patient expressed understanding and agreed to proceed.   History of Present Illness: Pt is referred to therapy for PTSD (MST) and MDD by the [facility]. Her biggest stressors are her health, taking care of her children (15 & 6) transitioning out of her 12 year relationship. Pt has fibromyalgia and other health issues, which affects her ability to take care of all her family's needs.  She has previous therapy.  Treatment Goals Addressed: Pt will recall the traumatic event without becoming too overwhelmed with emotions 50% of the time, evidenced by self report. Meet with clinician in person weekly for therapy to monitor for progress towards goals and address any barriers to success; Reduce depression from average severity level of 6/10 down to a 4/10 in next 90 days by engaging in 3-5 positive coping skills for 1-2 hours per day as part of developing self-care routine; Reduce average anxiety level from 7/10 down to 5/10 in next 90 days by utilizing 3-5 relaxation skills/grounding skills per day, such as mindful breathing, progressive muscle relaxation, positive visualizations.  Progression towards Goal: Progressing     Observations/Objective: Patient presented for today's session on time and was alert, oriented x5, with no evidence or self-report of SI/HI or A/V H. Patient reported ongoing compliance with medication and denied any use of alcohol or illicit substances.  Clinician inquired about patient's current emotional ratings, as well as any significant changes in thoughts, feelings or behavior since previous  session. Patient's emotional ratings: 4/10 for depression, 4/10 for anxiety, 2/10 for anger/irritability, recalling traumatic event without becoming too overwhelmed with emotions 50% of the time within the past week. All ratings are evidenced by self report. Cln explored trauma emotional ratings with pt who identified her trauma triggers within the past week and coping skills used. Pt provided updates on health, x-relationship, moods, work, physical health,family, lack of sleep ."My moods have been mixed. "[facility], and I used it over the weekend, the side effects were awful and I've left messages for my dr." I just received the Modafanil which is my sleep med. Again, Cln provided education on sleep hygiene. Cln provided education how to to recognize the physical, cognitive, emotional, and behavioral responses that influence her mood.    Collaboration of Care: Other: Encouraged pt to contact Hospice for grief counseling  Patient/Guardian was advised Release of Information must be obtained prior to any record release in order to collaborate their care with an outside provider.   Patient/Guardian was advised if they have not already done so to contact the registration department to sign all necessary forms in order for [place] to release information regarding their care.   Consent: Patient/Guardian gives verbal consent for treatment and assignment of benefits for services provided during this visit. Patient/Guardian expressed understanding and agreed to proceed.    Plan: .Counselor will be on FMLA for 4-6 weeks. This is the last session with patient until I return. Cln and pt discussed having a temporary therapist while clin is out. "I choose to not have an interim therapist while she is on FMLA. Cln gave pt information for emergency situations. [facility], ED.     Follow Up Instructions: I discussed the assessment and treatment plan with the patient. The patient was provided an opportunity to ask  questions and all were answered. The patient agreed with the plan and demonstrated an understanding of the instructions.   The patient was advised to call back or seek an in-person evaluation if the symptoms worsen or if the condition fails to improve as anticipated.   I provided 45 minutes of non-face-to-face time during this encounter.     Nylan Nevel S, LCAS

## 2022-08-26 ENCOUNTER — Ambulatory Visit (INDEPENDENT_AMBULATORY_CARE_PROVIDER_SITE_OTHER): Payer: Self-pay | Admitting: Licensed Clinical Social Worker

## 2022-08-26 DIAGNOSIS — F331 Major depressive disorder, recurrent, moderate: Secondary | ICD-10-CM

## 2022-08-26 DIAGNOSIS — F4310 Post-traumatic stress disorder, unspecified: Secondary | ICD-10-CM

## 2022-09-01 ENCOUNTER — Encounter (HOSPITAL_COMMUNITY): Payer: Self-pay | Admitting: Licensed Clinical Social Worker

## 2022-09-01 NOTE — Progress Notes (Signed)
Virtual Visit via video Note   I connected with Susan Guerra on 08/12/22 at 100pm by video enabled telemedicine application and verified that I am speaking with the correct person using two identifiers.   Location: Patient: home Provider: home office   I discussed the limitations of evaluation and management by telemedicine and the availability of in person appointments. The patient expressed understanding and agreed to proceed.   History of Present Illness: Pt is referred to therapy for PTSD (MST) and MDD by the [facility]. Her biggest stressors are her health, taking care of her children (15 & 6) transitioning out of her 12 year relationship. Pt has fibromyalgia and other health issues, which affects her ability to take care of all her family's needs.  She has previous therapy.  Treatment Goals Addressed: Pt will recall the traumatic event without becoming too overwhelmed with emotions 50% of the time, evidenced by self report. Meet with clinician in person weekly for therapy to monitor for progress towards goals and address any barriers to success; Reduce depression from average severity level of 6/10 down to a 4/10 in next 90 days by engaging in 3-5 positive coping skills for 1-2 hours per day as part of developing self-care routine; Reduce average anxiety level from 7/10 down to 5/10 in next 90 days by utilizing 3-5 relaxation skills/grounding skills per day, such as mindful breathing, progressive muscle relaxation, positive visualizations.  Progression towards Goal: Progressing     Observations/Objective: Patient presented for today's session on time and was alert, oriented x5, with no evidence or self-report of SI/HI or A/V H. Patient reported ongoing compliance with medication and denied any use of alcohol or illicit substances.  Clinician inquired about patient's current emotional ratings, as well as any significant changes in thoughts, feelings or behavior since previous session.  Patient's emotional ratings: 4/10 for depression, 4/10 for anxiety, 4/10 for anger/irritability, recalling traumatic event without becoming too overwhelmed with emotions 50% of the time. All ratings are evidenced by self report. Cln explored trauma emotional ratings with pt who identified her trauma triggers and coping skills used. Pt reports on her christmas holiday, grieving over her parents, 1st holiday since their passing. Continue to encourage pt to contact [facility] for grief counseling.       Collaboration of Care: Other: Encouraged pt to contact [facility]osp[facility] for grief counseling  Patient/Guardian was advised Release of Information must be obtained prior to any record release in order to collaborate their care with an outside provider.   Patient/Guardian was advised if they have not already done so to contact the registration department to sign all necessary forms in order for [place] to release information regarding their care.   Consent: Patient/Guardian gives verbal consent for treatment and assignment of benefits for services provided during this visit. Patient/Guardian expressed understanding and agreed to proceed.  Assessment and Plan: Counselor will continue to meet with patient to address treatment plan goals. Patient will continue to follow recommendations of providers and implement skills learned in session.        Follow Up Instructions: I discussed the assessment and treatment plan with the patient. The patient was provided an opportunity to ask questions and all were answered. The patient agreed with the plan and demonstrated an understanding of the plan.   The patient was advised to call back or seek an in-person evaluation if the symptoms worsen or if the condition fails to improve as anticipated.   I provided 30 minutes of non-face-to-face time during  this encounter.     Malie Kashani S, LCAS

## 2022-09-02 ENCOUNTER — Ambulatory Visit (INDEPENDENT_AMBULATORY_CARE_PROVIDER_SITE_OTHER): Payer: Medicaid Other | Admitting: Licensed Clinical Social Worker

## 2022-09-02 DIAGNOSIS — F4310 Post-traumatic stress disorder, unspecified: Secondary | ICD-10-CM

## 2022-09-02 DIAGNOSIS — F331 Major depressive disorder, recurrent, moderate: Secondary | ICD-10-CM

## 2022-09-09 ENCOUNTER — Ambulatory Visit (INDEPENDENT_AMBULATORY_CARE_PROVIDER_SITE_OTHER): Payer: Medicaid Other | Admitting: Licensed Clinical Social Worker

## 2022-09-09 DIAGNOSIS — F331 Major depressive disorder, recurrent, moderate: Secondary | ICD-10-CM

## 2022-09-09 DIAGNOSIS — F4310 Post-traumatic stress disorder, unspecified: Secondary | ICD-10-CM

## 2022-09-13 ENCOUNTER — Encounter (HOSPITAL_COMMUNITY): Payer: Self-pay | Admitting: Licensed Clinical Social Worker

## 2022-09-13 NOTE — Progress Notes (Signed)
Virtual Visit via video Note   I connected with Susan Guerra on 08/19/22 at 100pm by video enabled telemedicine application and verified that I am speaking with the correct person using two identifiers.   Location: Patient: home Provider: home office   I discussed the limitations of evaluation and management by telemedicine and the availability of in person appointments. The patient expressed understanding and agreed to proceed.   History of Present Illness: Susan Guerra is referred to therapy for PTSD (MST) and MDD by the [facility]. Her biggest stressors are her health, taking care of her children (15 & 6) transitioning out of her 12 year relationship. Susan Guerra has fibromyalgia and other health issues, which affects her ability to take care of all her family's needs.  She has previous therapy.  Treatment Goals Addressed: Susan Guerra will recall the traumatic event without becoming too overwhelmed with emotions 50% of the time, evidenced by self report. Meet with clinician in person weekly for therapy to monitor for progress towards goals and address any barriers to success; Reduce depression from average severity level of 6/10 down to a 4/10 in next 90 days by engaging in 3-5 positive coping skills for 1-2 hours per day as part of developing self-care routine; Reduce average anxiety level from 7/10 down to 5/10 in next 90 days by utilizing 3-5 relaxation skills/grounding skills per day, such as mindful breathing, progressive muscle relaxation, positive visualizations.  Progression towards Goal: Progressing     Observations/Objective: Patient presented for today's session on time and was alert, oriented x5, with no evidence or self-report of SI/HI or A/V H. Patient reported ongoing compliance with medication and denied any use of alcohol or illicit substances.  Clinician inquired about patient's current emotional ratings, as well as any significant changes in thoughts, feelings or behavior since previous session.  Patient's emotional ratings: 4/10 for depression, 4/10 for anxiety, 4/10 for anger/irritability, recalling traumatic event without becoming too overwhelmed with emotions 50% of the time. All ratings are evidenced by self report. Cln explored trauma emotional ratings with Susan Guerra who identified her trauma triggers and coping skills used. Susan Guerra reports on her holidays, grieving over her parents, 1st holiday since their passing, family, job. Susan Guerra continues to experience health issues, ptsd MST issues and sleep issues. Cln and Susan Guerra identified stressors and goals and coping skills. Cln encouraged Susan Guerra to talk to psychiatrist at [facility].Marland Kitchen       Collaboration of Care: Other: Encouraged Susan Guerra to contact [facility] for grief counseling  Patient/Guardian was advised Release of Information must be obtained prior to any record release in order to collaborate their care with an outside provider.   Patient/Guardian was advised if they have not already done so to contact the registration department to sign all necessary forms in order for [place] to release information regarding their care.   Consent: Patient/Guardian gives verbal consent for treatment and assignment of benefits for services provided during this visit. Patient/Guardian expressed understanding and agreed to proceed.  Assessment and Plan: Counselor will continue to meet with patient to address treatment plan goals. Patient will continue to follow recommendations of providers and implement skills learned in session.        Follow Up Instructions: I discussed the assessment and treatment plan with the patient. The patient was provided an opportunity to ask questions and all were answered. The patient agreed with the plan and demonstrated an understanding of the plan.   The patient was advised to call back or seek an in-person evaluation if  the symptoms worsen or if the condition fails to improve as anticipated.   I provided 30 minutes of non-face-to-face time during this  encounter.     Maridee Slape S, LCAS

## 2022-09-16 ENCOUNTER — Ambulatory Visit (INDEPENDENT_AMBULATORY_CARE_PROVIDER_SITE_OTHER): Payer: Self-pay | Admitting: Licensed Clinical Social Worker

## 2022-09-16 DIAGNOSIS — F331 Major depressive disorder, recurrent, moderate: Secondary | ICD-10-CM

## 2022-09-16 DIAGNOSIS — F4310 Post-traumatic stress disorder, unspecified: Secondary | ICD-10-CM

## 2022-09-20 ENCOUNTER — Encounter (HOSPITAL_COMMUNITY): Payer: Self-pay | Admitting: Licensed Clinical Social Worker

## 2022-09-20 NOTE — Addendum Note (Signed)
Addended by: Jenkins Rouge on: 09/20/2022 05:03 PM   Modules accepted: Level of Service

## 2022-09-20 NOTE — Progress Notes (Signed)
Virtual Visit via video Note   I connected with Susan Guerra on 08/26/22 at 1:00pm by video enabled telemedicine application and verified that I am speaking with the correct person using two identifiers.   Location: Patient: home Provider: home office   I discussed the limitations of evaluation and management by telemedicine and the availability of in person appointments. The patient expressed understanding and agreed to proceed.   History of Present Illness: Pt is referred to therapy for PTSD (MST) and MDD by the [facility]. Her biggest stressors are her health, taking care of her children (15 & 6) transitioning out of her 12 year relationship. Pt has fibromyalgia and other health issues, which affects her ability to take care of all her family's needs.  She has previous therapy.  Treatment Goals Addressed: Pt will recall the traumatic event without becoming too overwhelmed with emotions 50% of the time, evidenced by self report. Meet with clinician in person weekly for therapy to monitor for progress towards goals and address any barriers to success; Reduce depression from average severity level of 6/10 down to a 4/10 in next 90 days by engaging in 3-5 positive coping skills for 1-2 hours per day as part of developing self-care routine; Reduce average anxiety level from 7/10 down to 5/10 in next 90 days by utilizing 3-5 relaxation skills/grounding skills per day, such as mindful breathing, progressive muscle relaxation, positive visualizations.  Progression towards Goal: Progressing     Observations/Objective: Patient presented for today's session on time and was alert, oriented x5, with no evidence or self-report of SI/HI or A/V H. Patient reported ongoing compliance with medication and denied any use of alcohol or illicit substances.  Clinician inquired about patient's current emotional ratings, as well as any significant changes in thoughts, feelings or behavior since previous session.  Patient's emotional ratings: 4/10 for depression, 4/10 for anxiety, 4/10 for anger/irritability, recalling traumatic event without becoming too overwhelmed with emotions 50% of the time. All ratings are evidenced by self report. Cln explored trauma emotional ratings with pt who identified her trauma triggers and coping skills used. Counselor and patient discussed trauma triggers and responses that occurred in her life over the past week. We discussed more intensive options that are brain based in targeting and reducing the impact of the trauma.         Collaboration of Care: Other: Encouraged pt to contact [facility] for grief counseling  Patient/Guardian was advised Release of Information must be obtained prior to any record release in order to collaborate their care with an outside provider.   Patient/Guardian was advised if they have not already done so to contact the registration department to sign all necessary forms in order for [place] to release information regarding their care.   Consent: Patient/Guardian gives verbal consent for treatment and assignment of benefits for services provided during this visit. Patient/Guardian expressed understanding and agreed to proceed.  Assessment and Plan: Counselor will continue to meet with patient to address treatment plan goals. Patient will continue to follow recommendations of providers and implement skills learned in session.        Follow Up Instructions: I discussed the assessment and treatment plan with the patient. The patient was provided an opportunity to ask questions and all were answered. The patient agreed with the plan and demonstrated an understanding of the plan.   The patient was advised to call back or seek an in-person evaluation if the symptoms worsen or if the condition fails to improve  as anticipated.   I provided 30 minutes of non-face-to-face time during this encounter.     Bill Yohn S, LCAS

## 2022-09-22 ENCOUNTER — Encounter (HOSPITAL_COMMUNITY): Payer: Self-pay | Admitting: Licensed Clinical Social Worker

## 2022-09-23 ENCOUNTER — Ambulatory Visit (HOSPITAL_COMMUNITY): Payer: Medicaid Other | Admitting: Licensed Clinical Social Worker

## 2022-09-30 ENCOUNTER — Ambulatory Visit (HOSPITAL_COMMUNITY): Payer: Medicaid Other | Admitting: Licensed Clinical Social Worker

## 2022-10-03 ENCOUNTER — Ambulatory Visit (INDEPENDENT_AMBULATORY_CARE_PROVIDER_SITE_OTHER): Payer: No Typology Code available for payment source | Admitting: Licensed Clinical Social Worker

## 2022-10-03 DIAGNOSIS — F4310 Post-traumatic stress disorder, unspecified: Secondary | ICD-10-CM

## 2022-10-03 DIAGNOSIS — F331 Major depressive disorder, recurrent, moderate: Secondary | ICD-10-CM

## 2022-10-05 ENCOUNTER — Encounter (HOSPITAL_COMMUNITY): Payer: Self-pay | Admitting: Licensed Clinical Social Worker

## 2022-10-05 NOTE — Progress Notes (Signed)
Virtual Visit via video Note   I connected with Arta Bruce on 10/03/22 at 10:00 am by video enabled telemedicine application and verified that I am speaking with the correct person using two identifiers.   Location: Patient: home Provider: home office   I discussed the limitations of evaluation and management by telemedicine and the availability of in person appointments. The patient expressed understanding and agreed to proceed.   History of Present Illness: Pt is referred to therapy for PTSD (MST) and MDD by the [facility]. Her biggest stressors are her health, taking care of her children (15 & 6) transitioning out of her 12 year relationship. Pt has fibromyalgia and other health issues, which affects her ability to take care of all her family's needs.  She has previous therapy.  Treatment Goals Addressed: Pt will recall the traumatic event without becoming too overwhelmed with emotions 50% of the time, evidenced by self report. Meet with clinician in person weekly for therapy to monitor for progress towards goals and address any barriers to success; Reduce depression from average severity level of 6/10 down to a 4/10 in next 90 days by engaging in 3-5 positive coping skills for 1-2 hours per day as part of developing self-care routine; Reduce average anxiety level from 7/10 down to 5/10 in next 90 days by utilizing 3-5 relaxation skills/grounding skills per day, such as mindful breathing, progressive muscle relaxation, positive visualizations.  Progression towards Goal: Progressing     Observations/Objective: Patient presented for today's session on time and was alert, oriented x5, with no evidence or self-report of SI/HI or A/V H. Patient reported ongoing compliance with medication and denied any use of alcohol or illicit substances.  Clinician inquired about patient's current emotional ratings, as well as any significant changes in thoughts, feelings or behavior since previous  session. Patient's emotional ratings: 6/10 for depression, 6/10 for anxiety, 4/10 for anger/irritability, recalling traumatic event without becoming too overwhelmed with emotions 50% of the time. All ratings are evidenced by self report. Cln explored trauma emotional ratings with pt who identified her trauma triggers and coping skills used. Counselor and patient discussed trauma triggers and responses that occurred in her life over the past week. Pt reports she has kept her niece over the past week which has added additional stress to her life. We discussed more intensive options that are brain based in targeting and reducing the impact of the trauma over the past week.         Collaboration of Care: Other: Encouraged pt to contact [facility] for grief counseling  Patient/Guardian was advised Release of Information must be obtained prior to any record release in order to collaborate their care with an outside provider.   Patient/Guardian was advised if they have not already done so to contact the registration department to sign all necessary forms in order for [place] to release information regarding their care.   Consent: Patient/Guardian gives verbal consent for treatment and assignment of benefits for services provided during this visit. Patient/Guardian expressed understanding and agreed to proceed.  Assessment and Plan: Counselor will continue to meet with patient to address treatment plan goals. Patient will continue to follow recommendations of providers and implement skills learned in session.        Follow Up Instructions: I discussed the assessment and treatment plan with the patient. The patient was provided an opportunity to ask questions and all were answered. The patient agreed with the plan and demonstrated an understanding of the plan.  The patient was advised to call back or seek an in-person evaluation if the symptoms worsen or if the condition fails to improve as anticipated.   I  provided 30 minutes of non-face-to-face time during this encounter.     Mailani Degroote S, LCAS

## 2022-10-05 NOTE — Progress Notes (Signed)
Virtual Visit via video Note   I connected with Susan Guerra on 09/02/22 at 1:00pm by video enabled telemedicine application and verified that I am speaking with the correct person using two identifiers.   Location: Patient: home Provider: home office   I discussed the limitations of evaluation and management by telemedicine and the availability of in person appointments. The patient expressed understanding and agreed to proceed.   History of Present Illness: Pt is referred to therapy for PTSD (MST) and MDD by the [facility]. Her biggest stressors are her health, taking care of her children (15 & 6) transitioning out of her 12 year relationship. Pt has fibromyalgia and other health issues, which affects her ability to take care of all her family's needs.  She has previous therapy.  Treatment Goals Addressed: Pt will recall the traumatic event without becoming too overwhelmed with emotions 50% of the time, evidenced by self report. Meet with clinician in person weekly for therapy to monitor for progress towards goals and address any barriers to success; Reduce depression from average severity level of 6/10 down to a 4/10 in next 90 days by engaging in 3-5 positive coping skills for 1-2 hours per day as part of developing self-care routine; Reduce average anxiety level from 7/10 down to 5/10 in next 90 days by utilizing 3-5 relaxation skills/grounding skills per day, such as mindful breathing, progressive muscle relaxation, positive visualizations.  Progression towards Goal: Progressing     Observations/Objective: Patient presented for today's session on time and was alert, oriented x5, with no evidence or self-report of SI/HI or A/V H. Patient reported ongoing compliance with medication and denied any use of alcohol or illicit substances.  Clinician inquired about patient's current emotional ratings, as well as any significant changes in thoughts, feelings or behavior since previous session.  Patient's emotional ratings: 4/10 for depression, 4/10 for anxiety, 4/10 for anger/irritability, recalling traumatic event without becoming too overwhelmed with emotions 50% of the time. All ratings are evidenced by self report. Cln explored trauma emotional ratings with pt who identified her trauma triggers and coping skills used. Counselor and patient discussed trauma triggers and responses that occurred in her life over the past week. Cln used CBT to assist patient in identifying her negative thought patterns. Patient was encouraged to use positive behavioral responses to stressful and challenging situations. Pt practiced in session.      Collaboration of Care: Other: Encouraged pt to contact [facility] for grief counseling  Patient/Guardian was advised Release of Information must be obtained prior to any record release in order to collaborate their care with an outside provider.   Patient/Guardian was advised if they have not already done so to contact the registration department to sign all necessary forms in order for [place] to release information regarding their care.   Consent: Patient/Guardian gives verbal consent for treatment and assignment of benefits for services provided during this visit. Patient/Guardian expressed understanding and agreed to proceed.  Assessment and Plan: Counselor will continue to meet with patient to address treatment plan goals. Patient will continue to follow recommendations of providers and implement skills learned in session.        Follow Up Instructions: I discussed the assessment and treatment plan with the patient. The patient was provided an opportunity to ask questions and all were answered. The patient agreed with the plan and demonstrated an understanding of the plan.   The patient was advised to call back or seek an in-person evaluation if the symptoms  worsen or if the condition fails to improve as anticipated.   I provided 30 minutes of  non-face-to-face time during this encounter.     Alixandria Friedt S, LCAS

## 2022-10-07 ENCOUNTER — Ambulatory Visit (HOSPITAL_COMMUNITY): Payer: Medicaid Other | Admitting: Licensed Clinical Social Worker

## 2022-10-14 ENCOUNTER — Ambulatory Visit (HOSPITAL_COMMUNITY): Payer: Medicaid Other | Admitting: Licensed Clinical Social Worker

## 2022-10-14 ENCOUNTER — Encounter (HOSPITAL_COMMUNITY): Payer: Self-pay | Admitting: Licensed Clinical Social Worker

## 2022-10-14 DIAGNOSIS — F331 Major depressive disorder, recurrent, moderate: Secondary | ICD-10-CM

## 2022-10-14 DIAGNOSIS — F4310 Post-traumatic stress disorder, unspecified: Secondary | ICD-10-CM

## 2022-10-14 NOTE — Progress Notes (Incomplete)
Virtual Visit via video Note   I connected with Susan Guerra on 10/14/22 at 1:00 pm by video enabled telemedicine application and verified that I am speaking with the correct person using two identifiers.   Location: Patient: home Provider: home office   I discussed the limitations of evaluation and management by telemedicine and the availability of in person appointments. The patient expressed understanding and agreed to proceed.   History of Present Illness: Pt is referred to therapy for PTSD (MST) and MDD by the [facility]. Her biggest stressors are her health, taking care of her children (15 & 6) transitioning out of her 12 year relationship. Pt has fibromyalgia and other health issues, which affects her ability to take care of all her family's needs.  She has previous therapy.  Treatment Goals Addressed: Pt will recall the traumatic event without becoming too overwhelmed with emotions 50% of the time, evidenced by self report. Meet with clinician in person weekly for therapy to monitor for progress towards goals and address any barriers to success; Reduce depression from average severity level of 6/10 down to a 4/10 in next 90 days by engaging in 3-5 positive coping skills for 1-2 hours per day as part of developing self-care routine; Reduce average anxiety level from 7/10 down to 5/10 in next 90 days by utilizing 3-5 relaxation skills/grounding skills per day, such as mindful breathing, progressive muscle relaxation, positive visualizations.  Progression towards Goal: Progressing     Observations/Objective: Patient presented for today's session on time and was alert, oriented x5, with no evidence or self-report of SI/HI or A/V H. Patient reported ongoing compliance with medication and denied any use of alcohol or illicit substances.  Clinician inquired about patient's current emotional ratings, as well as any significant changes in thoughts, feelings or behavior since previous  session. Patient's emotional ratings: 6/10 for depression, 6/10 for anxiety, 4/10 for anger/irritability, recalling traumatic event without becoming too overwhelmed with emotions 50% of the time. All ratings are evidenced by self report. Cln explored trauma emotional ratings with pt who identified her trauma triggers and coping skills used. Counselor and patient discussed trauma triggers and responses that occurred in her life over the past week. Pt reports she has kept her niece over the past week which has added additional stress to her life. We discussed more intensive options that are brain based in targeting and reducing the impact of the trauma over the past week.         Collaboration of Care: Other: Encouraged pt to contact [facility] for grief counseling  Patient/Guardian was advised Release of Information must be obtained prior to any record release in order to collaborate their care with an outside provider.   Patient/Guardian was advised if they have not already done so to contact the registration department to sign all necessary forms in order for [place] to release information regarding their care.   Consent: Patient/Guardian gives verbal consent for treatment and assignment of benefits for services provided during this visit. Patient/Guardian expressed understanding and agreed to proceed.  Assessment and Plan: Counselor will continue to meet with patient to address treatment plan goals. Patient will continue to follow recommendations of providers and implement skills learned in session.        Follow Up Instructions: I discussed the assessment and treatment plan with the patient. The patient was provided an opportunity to ask questions and all were answered. The patient agreed with the plan and demonstrated an understanding of the plan.  The patient was advised to call back or seek an in-person evaluation if the symptoms worsen or if the condition fails to improve as anticipated.   I  provided 60 minutes of non-face-to-face time during this encounter.     Tovah Slavick S, LCAS

## 2022-10-19 ENCOUNTER — Encounter (HOSPITAL_COMMUNITY): Payer: Self-pay | Admitting: Licensed Clinical Social Worker

## 2022-10-19 NOTE — Addendum Note (Signed)
Addended by: Jenkins Rouge on: 10/19/2022 04:12 PM   Modules accepted: Level of Service

## 2022-10-19 NOTE — Progress Notes (Signed)
Virtual Visit via video Note   I connected with Susan Guerra on 09/09/22 at 1:00pm by video enabled telemedicine application and verified that I am speaking with the correct person using two identifiers.   Location: Patient: home Provider: home office   I discussed the limitations of evaluation and management by telemedicine and the availability of in person appointments. The patient expressed understanding and agreed to proceed.   History of Present Illness: Pt is referred to therapy for PTSD (MST) and MDD by the [facility]. Her biggest stressors are her health, taking care of her children (15 & 6) transitioning out of her 12 year relationship. Pt has fibromyalgia and other health issues, which affects her ability to take care of all her family's needs.  She has previous therapy.  Treatment Goals Addressed: Pt will recall the traumatic event without becoming too overwhelmed with emotions 50% of the time, evidenced by self report. Meet with clinician in person weekly for therapy to monitor for progress towards goals and address any barriers to success; Reduce depression from average severity level of 6/10 down to a 4/10 in next 90 days by engaging in 3-5 positive coping skills for 1-2 hours per day as part of developing self-care routine; Reduce average anxiety level from 7/10 down to 5/10 in next 90 days by utilizing 3-5 relaxation skills/grounding skills per day, such as mindful breathing, progressive muscle relaxation, positive visualizations.  Progression towards Goal: Progressing     Observations/Objective: Patient presented for today's session on time and was alert, oriented x5, with no evidence or self-report of SI/HI or A/V H. Patient reported ongoing compliance with medication and denied any use of alcohol or illicit substances.  Clinician inquired about patient's current emotional ratings, as well as any significant changes in thoughts, feelings or behavior since previous session.  Patient's emotional ratings: 4/10 for depression, 4/10 for anxiety, 4/10 for anger/irritability, recalling traumatic event without becoming too overwhelmed with emotions 50% of the time. All ratings are evidenced by self report. Cln explored trauma emotional ratings with pt who identified her trauma triggers and coping skills used. Counselor and patient discussed trauma triggers and responses that occurred in her life over the past week. Again,Cln used CBT to assist patient in identifying her negative thought patterns. Patient was encouraged to use positive behavioral responses to stressful and challenging situations, which include relationship, health, co-parenting, job, finances. Pt practiced in session.      Collaboration of Care: Other: Encouraged pt to contact Hospice for grief counseling  Patient/Guardian was advised Release of Information must be obtained prior to any record release in order to collaborate their care with an outside provider.   Patient/Guardian was advised if they have not already done so to contact the registration department to sign all necessary forms in order for [place] to release information regarding their care.   Consent: Patient/Guardian gives verbal consent for treatment and assignment of benefits for services provided during this visit. Patient/Guardian expressed understanding and agreed to proceed.  Assessment and Plan: Counselor will continue to meet with patient to address treatment plan goals. Patient will continue to follow recommendations of providers and implement skills learned in session.        Follow Up Instructions: I discussed the assessment and treatment plan with the patient. The patient was provided an opportunity to ask questions and all were answered. The patient agreed with the plan and demonstrated an understanding of the plan.   The patient was advised to call back or  seek an in-person evaluation if the symptoms worsen or if the condition fails  to improve as anticipated.   I provided 30 minutes of non-face-to-face time during this encounter.     Susan Guerra S, LCAS

## 2022-10-21 ENCOUNTER — Encounter (HOSPITAL_COMMUNITY): Payer: Self-pay | Admitting: Licensed Clinical Social Worker

## 2022-10-21 ENCOUNTER — Ambulatory Visit (HOSPITAL_COMMUNITY): Payer: Medicaid Other | Admitting: Licensed Clinical Social Worker

## 2022-10-28 ENCOUNTER — Encounter (HOSPITAL_COMMUNITY): Payer: Self-pay | Admitting: Licensed Clinical Social Worker

## 2022-10-28 ENCOUNTER — Ambulatory Visit (INDEPENDENT_AMBULATORY_CARE_PROVIDER_SITE_OTHER): Payer: No Typology Code available for payment source | Admitting: Licensed Clinical Social Worker

## 2022-10-28 DIAGNOSIS — F4310 Post-traumatic stress disorder, unspecified: Secondary | ICD-10-CM

## 2022-10-28 DIAGNOSIS — F331 Major depressive disorder, recurrent, moderate: Secondary | ICD-10-CM

## 2022-10-28 NOTE — Progress Notes (Signed)
Virtual Visit via video Note   I connected with Susan Guerra on 10/28/22 at 1:00 pm by video enabled telemedicine application and verified that I am speaking with the correct person using two identifiers.   Location: Patient: home Provider: home office   I discussed the limitations of evaluation and management by telemedicine and the availability of in person appointments. The patient expressed understanding and agreed to proceed.   History of Present Illness: Pt is referred to therapy for PTSD (MST) and MDD by the [facility]. Her biggest stressors are her health, taking care of her children (15 & 6) transitioning out of her 12 year relationship. Pt has fibromyalgia and other health issues, which affects her ability to take care of all her family's needs.  She has previous therapy.  Treatment Goals Addressed: Pt will recall the traumatic event without becoming too overwhelmed with emotions 50% of the time, evidenced by self report. Meet with clinician in person weekly for therapy to monitor for progress towards goals and address any barriers to success; Reduce depression from average severity level of 6/10 down to a 4/10 in next 90 days by engaging in 1-2 positive coping skills daily as part of developing self-care routine; Reduce average anxiety level from 7/10 down to 5/10 in next 90 days by utilizing 1-2 relaxation skills/grounding skills per day, such as mindful breathing, progressive muscle relaxation, positive visualizations.  Progression towards Goal: Progressing     Observations/Objective: Patient presented for today's session on time and was alert, oriented x5, with no evidence or self-report of SI/HI or A/V H. Patient reported ongoing compliance with medication and denied any use of alcohol or illicit substances.  Clinician inquired about patient's current emotional ratings, as well as any significant changes in thoughts, feelings or behavior since previous session. Patient's  emotional ratings: 6/10 for depression, 6/10 for anxiety, 4/10 for anger/irritability, recalling traumatic event without becoming too overwhelmed with emotions 50% of the time. All ratings are evidenced by self report. Cln explored trauma emotional ratings with pt who identified her trauma triggers and coping skills used. Counselor and patient discussed trauma triggers and responses that occurred in her life over the past week. Pt went home over the weekend to place the headstone on her parent's grave. Cln asked open ended questions. "It's opened up new wounds of relief and calm, other emotions were opened up as well.        Collaboration of Care: Other: Encouraged pt to contact Hospice for grief counseling  Patient/Guardian was advised Release of Information must be obtained prior to any record release in order to collaborate their care with an outside provider.   Patient/Guardian was advised if they have not already done so to contact the registration department to sign all necessary forms in order for [place] to release information regarding their care.   Consent: Patient/Guardian gives verbal consent for treatment and assignment of benefits for services provided during this visit. Patient/Guardian expressed understanding and agreed to proceed.  Assessment and Plan: Counselor will continue to meet with patient to address treatment plan goals. Patient will continue to follow recommendations of providers and implement skills learned in session.        Follow Up Instructions: I discussed the assessment and treatment plan with the patient. The patient was provided an opportunity to ask questions and all were answered. The patient agreed with the plan and demonstrated an understanding of the plan.   The patient was advised to call back or seek an  in-person evaluation if the symptoms worsen or if the condition fails to improve as anticipated.   I provided 60 minutes of non-face-to-face time during  this encounter.     Leodis Alcocer S, LCAS

## 2022-11-02 ENCOUNTER — Encounter (HOSPITAL_COMMUNITY): Payer: Self-pay | Admitting: Licensed Clinical Social Worker

## 2022-11-02 NOTE — Progress Notes (Signed)
Virtual Visit via video Note   I connected with Susan Guerra on 09/16/22 at 1:00pm by video enabled telemedicine application and verified that I am speaking with the correct person using two identifiers.   Location: Patient: home Provider: home office   I discussed the limitations of evaluation and management by telemedicine and the availability of in person appointments. The patient expressed understanding and agreed to proceed.   History of Present Illness: Pt is referred to therapy for PTSD (MST) and MDD by the [facility]. Her biggest stressors are her health, taking care of her children (15 & 6) transitioning out of her 12 year relationship. Pt has fibromyalgia and other health issues, which affects her ability to take care of all her family's needs.  She has previous therapy.  Treatment Goals Addressed: Pt will recall the traumatic event without becoming too overwhelmed with emotions 50% of the time, evidenced by self report. Meet with clinician in person weekly for therapy to monitor for progress towards goals and address any barriers to success; Reduce depression from average severity level of 6/10 down to a 4/10 in next 90 days by engaging in 3-5 positive coping skills for 1-2 hours per day as part of developing self-care routine; Reduce average anxiety level from 7/10 down to 5/10 in next 90 days by utilizing 3-5 relaxation skills/grounding skills per day, such as mindful breathing, progressive muscle relaxation, positive visualizations.  Progression towards Goal: Progressing     Observations/Objective: Patient presented for today's session on time and was alert, oriented x5, with no evidence or self-report of SI/HI or A/V H. Patient reported ongoing compliance with medication and denied any use of alcohol or illicit substances.  Clinician inquired about patient's current emotional ratings, as well as any significant changes in thoughts, feelings or behavior since previous session.  Patient's emotional ratings: 4/10 for depression, 4/10 for anxiety, 4/10 for anger/irritability, recalling traumatic event without becoming too overwhelmed with emotions 50% of the time. All ratings are evidenced by self report. Cln explored trauma emotional ratings with pt who identified her trauma triggers and coping skills used. Counselor and patient discussed trauma triggers and responses that occurred in her life over the past week. Cln used CBT to help pt focus on challenging and changing cognitive distortions and behaviors, improving emotional regulation, and the development of personal coping strategies that target solving current problems: which include relationship, health, co-parenting, job, finances. Pt practiced in session.      Collaboration of Care: Other: Encouraged pt to contact Hospice for grief counseling  Patient/Guardian was advised Release of Information must be obtained prior to any record release in order to collaborate their care with an outside provider.   Patient/Guardian was advised if they have not already done so to contact the registration department to sign all necessary forms in order for [place] to release information regarding their care.   Consent: Patient/Guardian gives verbal consent for treatment and assignment of benefits for services provided during this visit. Patient/Guardian expressed understanding and agreed to proceed.  Assessment and Plan: Counselor will continue to meet with patient to address treatment plan goals. Patient will continue to follow recommendations of providers and implement skills learned in session.        Follow Up Instructions: I discussed the assessment and treatment plan with the patient. The patient was provided an opportunity to ask questions and all were answered. The patient agreed with the plan and demonstrated an understanding of the plan.   The patient was  advised to call back or seek an in-person evaluation if the symptoms  worsen or if the condition fails to improve as anticipated.   I provided 30 minutes of non-face-to-face time during this encounter.     Susan Guerra S, LCAS

## 2022-11-04 ENCOUNTER — Ambulatory Visit (HOSPITAL_COMMUNITY): Payer: Medicaid Other | Admitting: Licensed Clinical Social Worker

## 2022-11-04 ENCOUNTER — Encounter (HOSPITAL_COMMUNITY): Payer: Self-pay | Admitting: Licensed Clinical Social Worker

## 2022-11-04 DIAGNOSIS — F331 Major depressive disorder, recurrent, moderate: Secondary | ICD-10-CM

## 2022-11-04 DIAGNOSIS — F4310 Post-traumatic stress disorder, unspecified: Secondary | ICD-10-CM

## 2022-11-04 NOTE — Progress Notes (Unsigned)
Virtual Visit via video Note   I connected with Susan Guerra on 11/04/22 at 1:00 pm by video enabled telemedicine application and verified that I am speaking with the correct person using two identifiers.   Location: Patient: home Provider: home office   I discussed the limitations of evaluation and management by telemedicine and the availability of in person appointments. The patient expressed understanding and agreed to proceed.   History of Present Illness: Pt is referred to therapy for PTSD (MST) and MDD by the [facility]. Her biggest stressors are her health, taking care of her children (15 & 6) transitioning out of her 12 year relationship. Pt has fibromyalgia and other health issues, which affects her ability to take care of all her family'Guerra needs.  She has previous therapy.  Treatment Goals Addressed: Pt will recall the traumatic event without becoming too overwhelmed with emotions 50% of the time, evidenced by self report. Meet with clinician in person weekly for therapy to monitor for progress towards goals and address any barriers to success; Reduce depression from average severity level of 6/10 down to a 4/10 in next 90 days by engaging in 1-2 positive coping skills daily as part of developing self-care routine; Reduce average anxiety level from 7/10 down to 5/10 in next 90 days by utilizing 1-2 relaxation skills/grounding skills per day, such as mindful breathing, progressive muscle relaxation, positive visualizations.  Progression towards Goal: Progressing     Observations/Objective: Patient presented for today'Guerra session on time and was alert, oriented x5, with no evidence or self-report of SI/HI or A/V H. Patient reported ongoing compliance with medication and denied any use of alcohol or illicit substances.  Clinician inquired about patient'Guerra current emotional ratings, as well as any significant changes in thoughts, feelings or behavior since previous session. Patient'Guerra  emotional ratings: 6/10 for depression, 6/10 for anxiety, 4/10 for anger/irritability, recalling traumatic event without becoming too overwhelmed with emotions 50% of the time. All ratings are evidenced by self report. Cln explored trauma emotional ratings with pt who identified her trauma triggers and coping skills used. Counselor and patient discussed trauma triggers and responses that occurred in her life over the past week. Pt went home over the weekend to place the headstone on her parent'Guerra grave. Cln asked open ended questions. "It'Guerra opened up new wounds of relief and calm, other emotions were opened up as well.        Collaboration of Care: Other: Encouraged pt to contact Hospice for grief counseling  Patient/Guardian was advised Release of Information must be obtained prior to any record release in order to collaborate their care with an outside provider.   Patient/Guardian was advised if they have not already done so to contact the registration department to sign all necessary forms in order for [place] to release information regarding their care.   Consent: Patient/Guardian gives verbal consent for treatment and assignment of benefits for services provided during this visit. Patient/Guardian expressed understanding and agreed to proceed.  Assessment and Plan: Counselor will continue to meet with patient to address treatment plan goals. Patient will continue to follow recommendations of providers and implement skills learned in session.        Follow Up Instructions: I discussed the assessment and treatment plan with the patient. The patient was provided an opportunity to ask questions and all were answered. The patient agreed with the plan and demonstrated an understanding of the plan.   The patient was advised to call back or seek an  in-person evaluation if the symptoms worsen or if the condition fails to improve as anticipated.   I provided 60 minutes of non-face-to-face time during  this encounter.     Susan Guerra, LCAS

## 2022-11-07 IMAGING — US US ABDOMEN LIMITED
1 series · 14 of 25 positions shown · non-contrast
Comparison: CT abdomen pelvis from same day.

CLINICAL DATA: Right upper quadrant pain.

EXAM:
ULTRASOUND ABDOMEN LIMITED RIGHT UPPER QUADRANT

[Series 1: us abdomen limited ruq (liver/gb) · 14 of 71 slices shown]
[im 1/71]
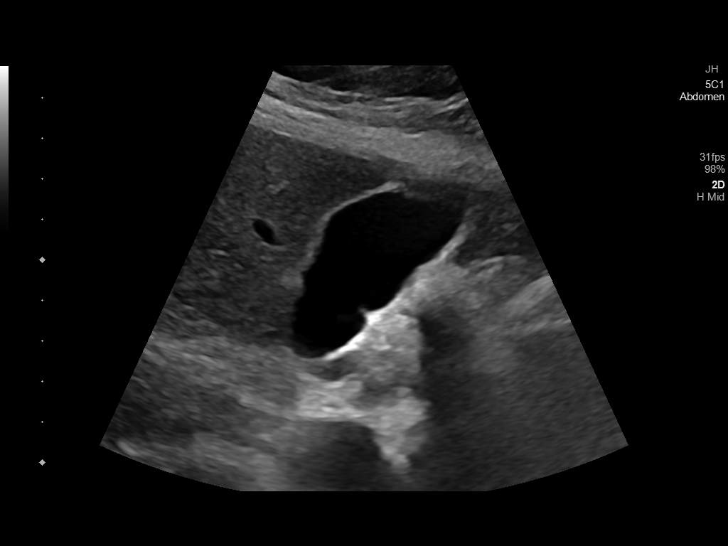
[im 6/71]
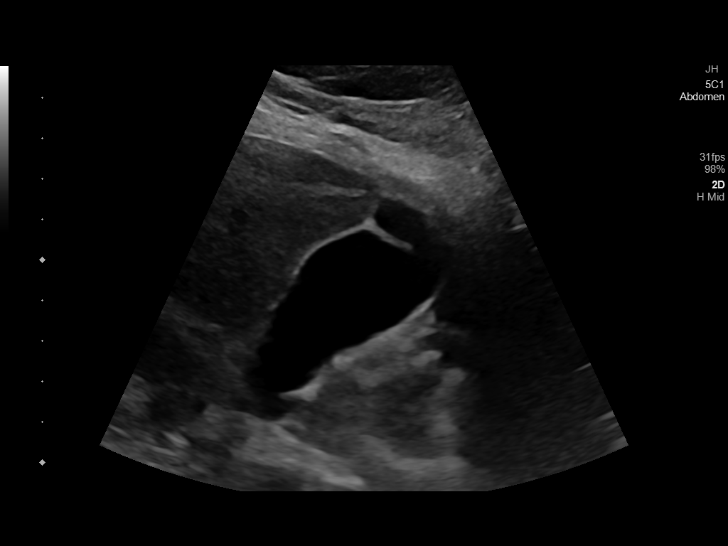
[im 12/71]
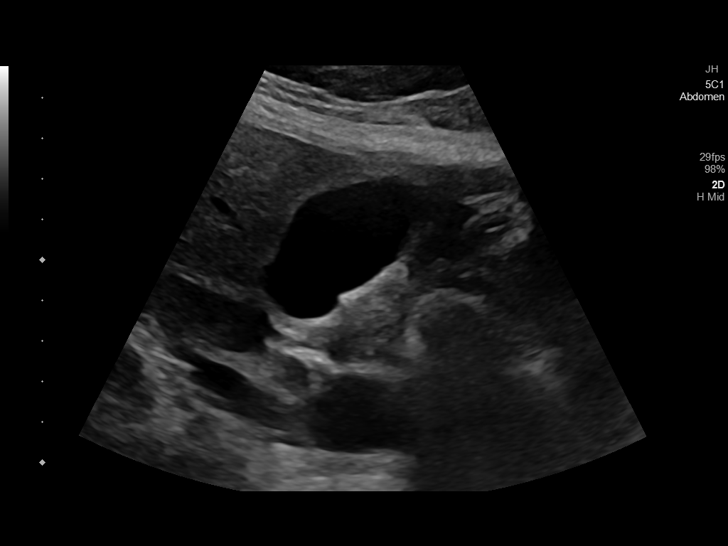
[im 18/71]
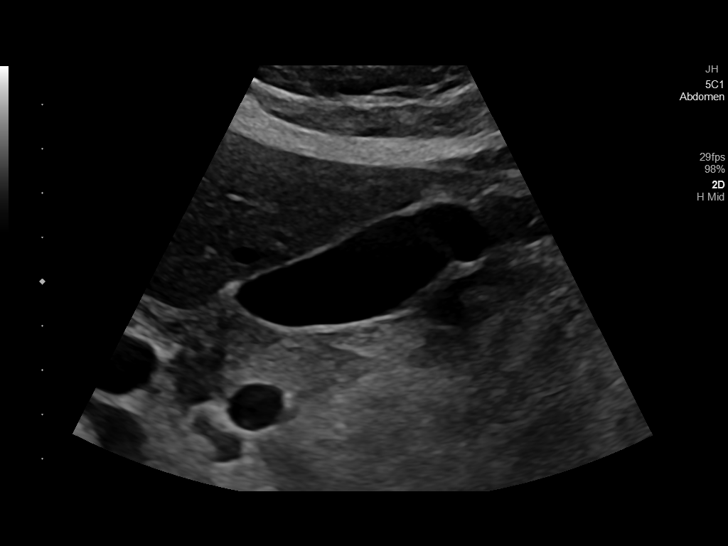
[im 24/71]
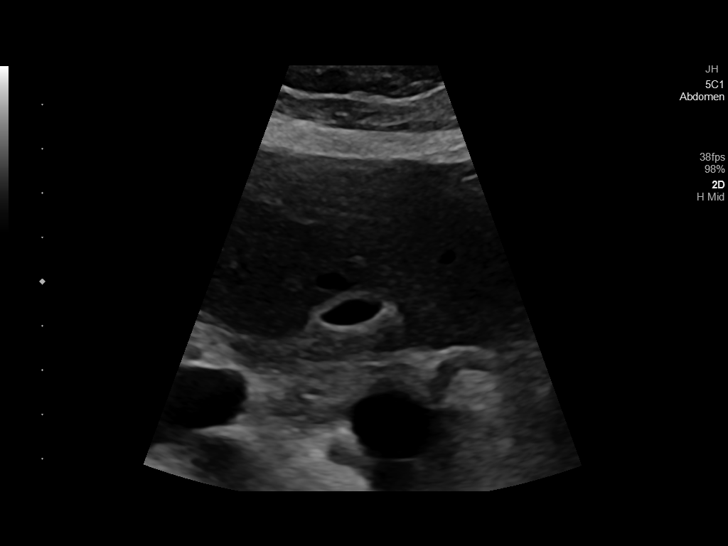
[im 27/71]
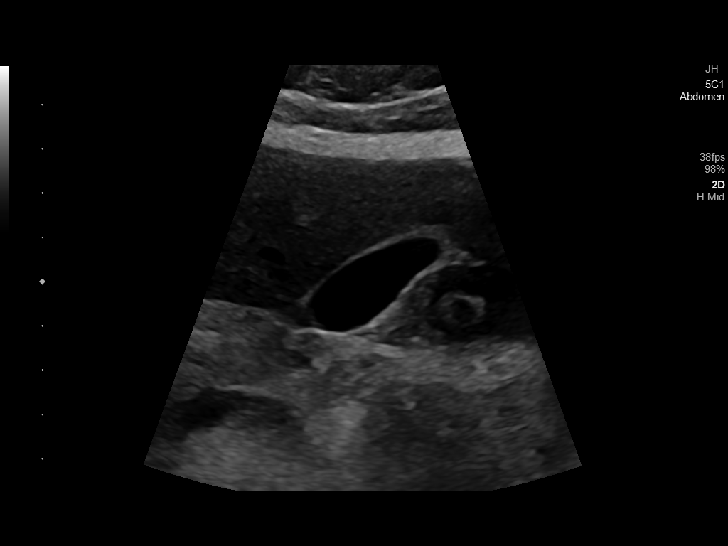
[im 33/71]
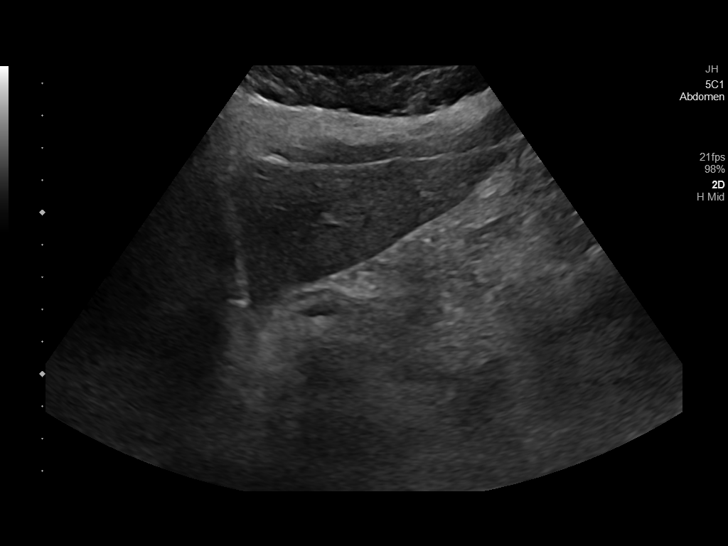
[im 38/71]
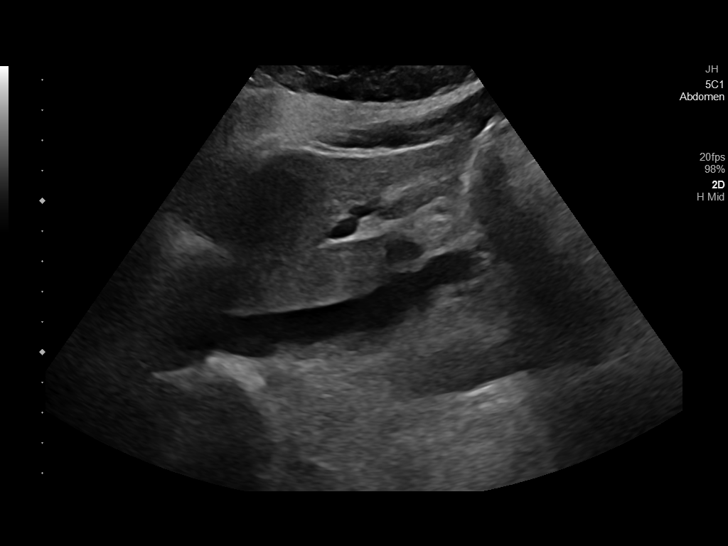
[im 44/71]
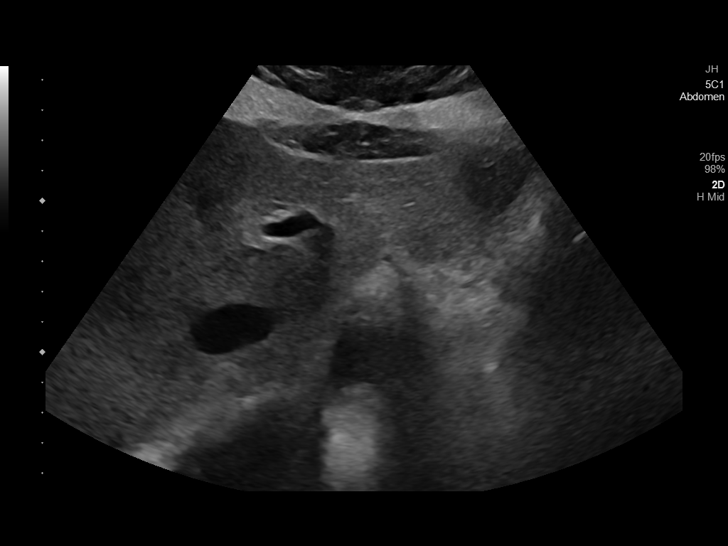
[im 47/71]
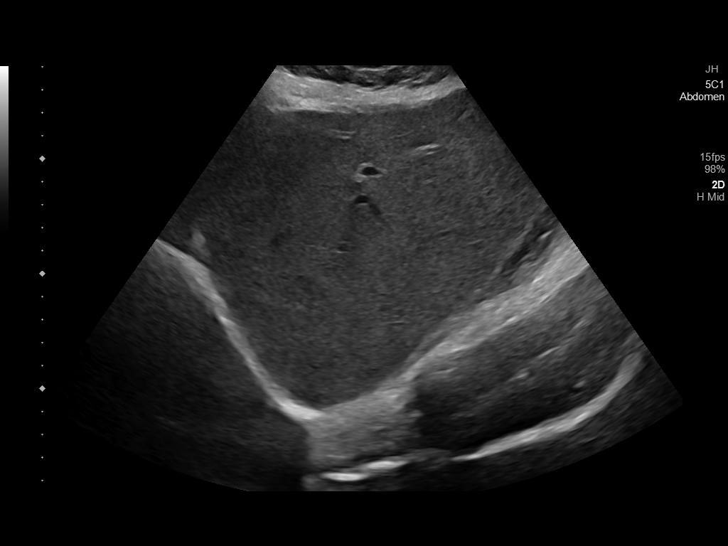
[im 53/71]
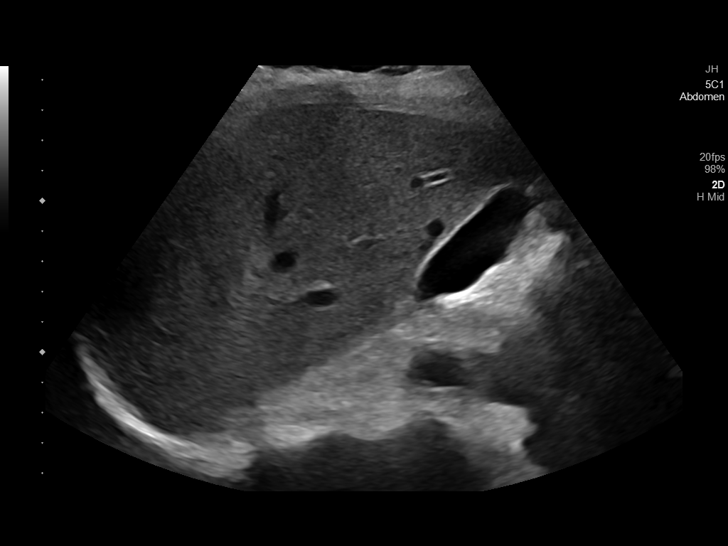
[im 59/71]
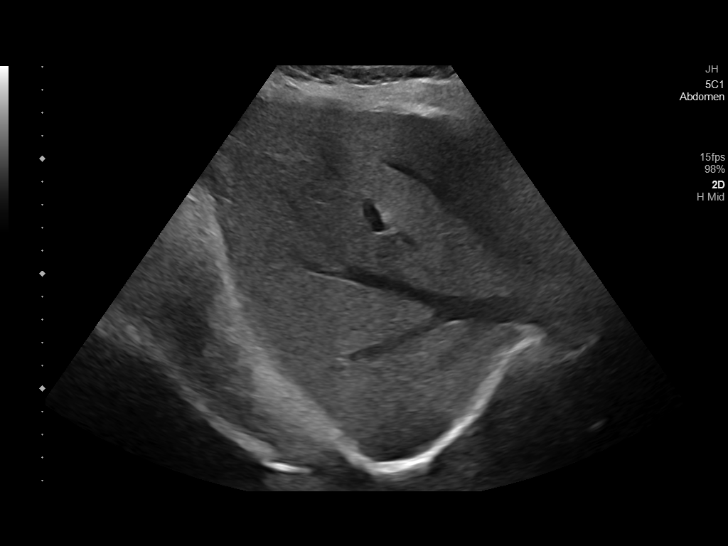
[im 65/71]
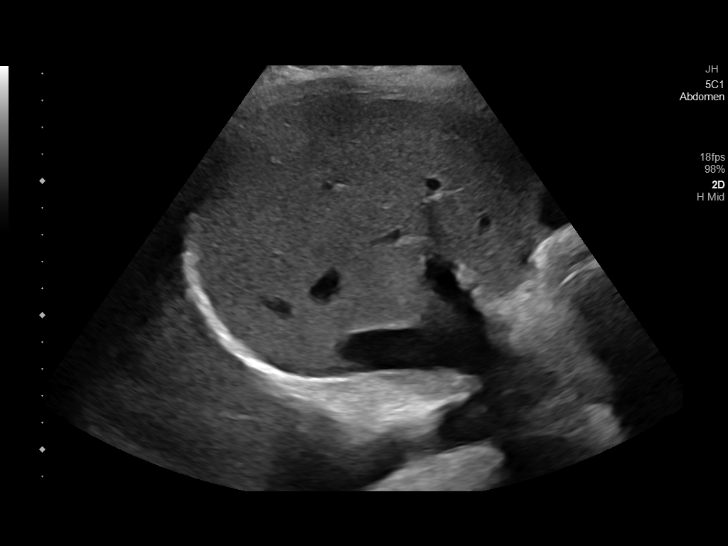
[im 71/71]
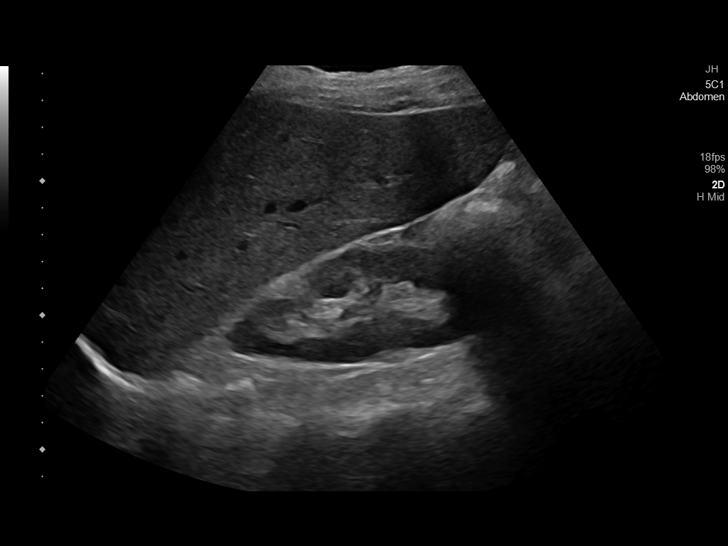

[14 of 25 positions shown; findings below may reference images not displayed]

FINDINGS: Gallbladder:

No gallstones or wall thickening visualized. No sonographic Murphy
sign noted by sonographer.

Common bile duct:

Diameter: 2 mm, normal.

Liver:

No focal lesion identified. Within normal limits in parenchymal
echogenicity. Portal vein is patent on color Doppler imaging with
normal direction of blood flow towards the liver.

Other: None.
IMPRESSION: 1. Normal right upper quadrant ultrasound.

## 2022-11-07 IMAGING — CT CT ABD-PELV W/ CM
2 of 5 series · 16 of 46 positions shown, 18 images · IV contrast (APPLIED)
Comparison: None Available.

CLINICAL DATA: Right lower quadrant pain and nausea for 5 days.

EXAM:
CT ABDOMEN AND PELVIS WITH CONTRAST
TECHNIQUE: Multidetector CT imaging of the abdomen and pelvis was performed
using the standard protocol following bolus administration of
intravenous contrast.

[Series 2: abdomen 5.0 · axial · 0.71mm/px · z∈[-1014,-609]mm · 13 of 93 slices shown, 15 images]
[im 6/93  soft-tissue]
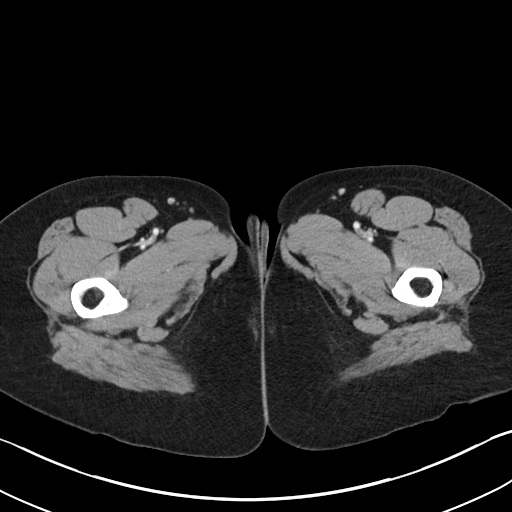
[im 6/93  bone]
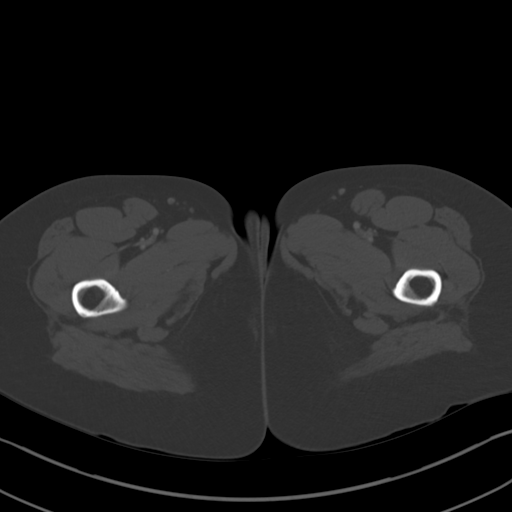
[im 12/93  soft-tissue]
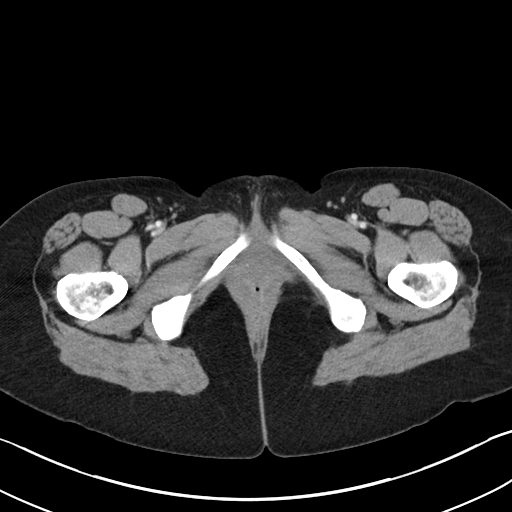
[im 18/93  soft-tissue]
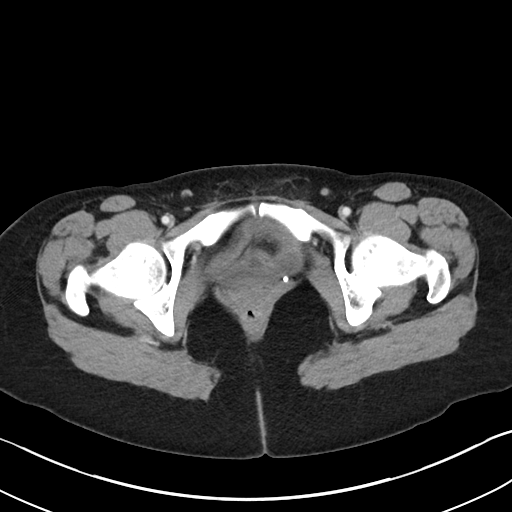
[im 29/93  soft-tissue]
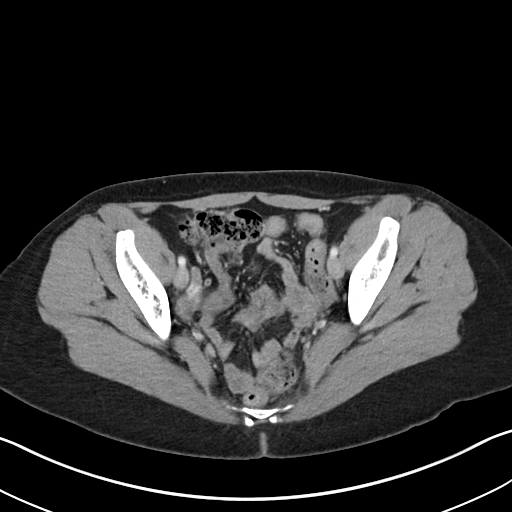
[im 35/93  soft-tissue]
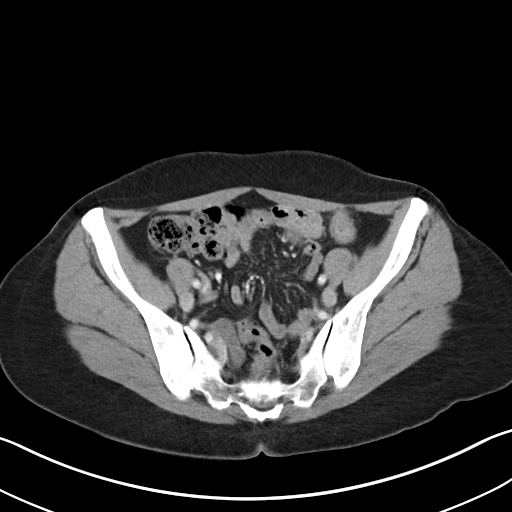
[im 41/93  soft-tissue]
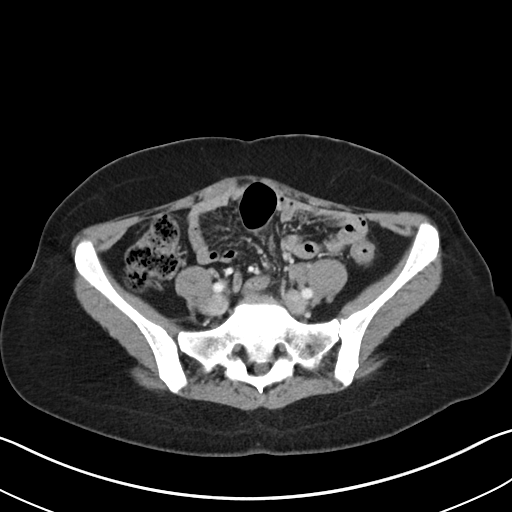
[im 47/93  soft-tissue]
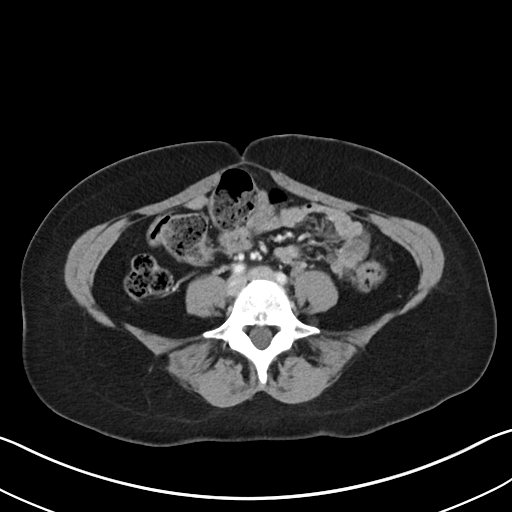
[im 52/93  soft-tissue]
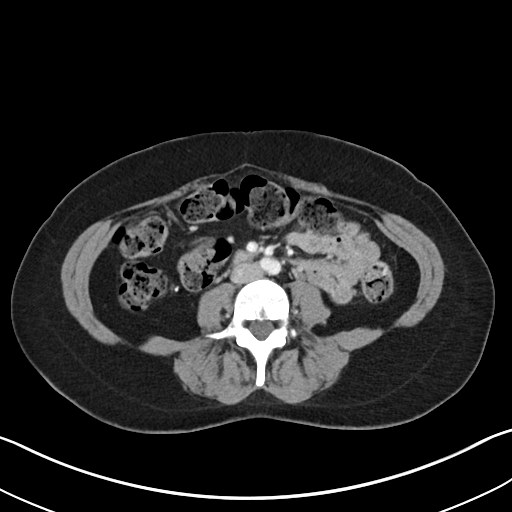
[im 58/93  soft-tissue]
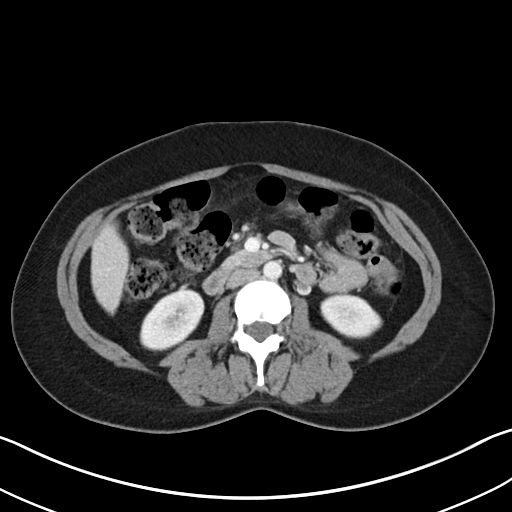
[im 58/93  bone]
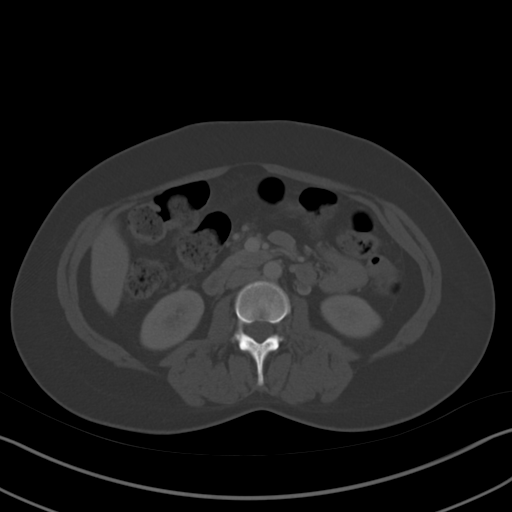
[im 64/93  soft-tissue]
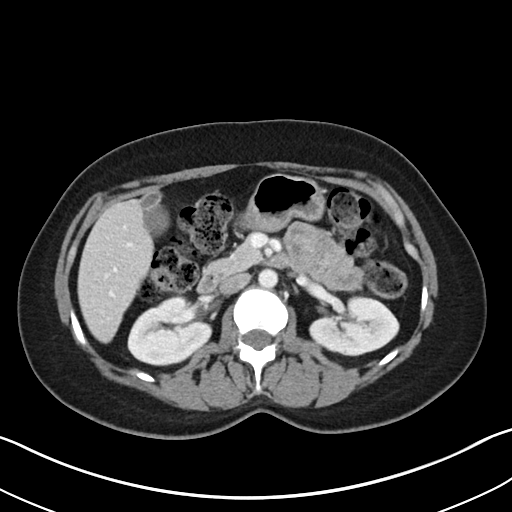
[im 75/93  soft-tissue]
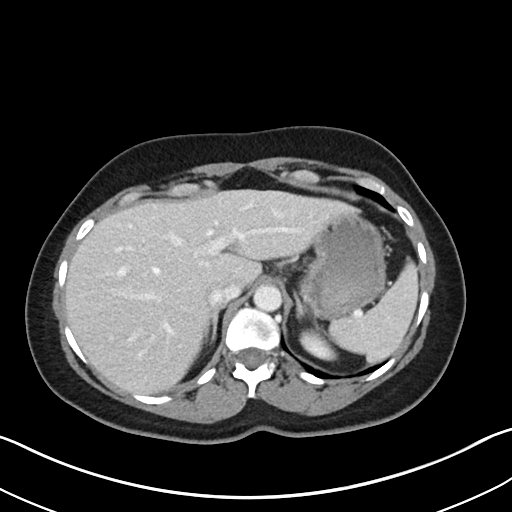
[im 81/93  soft-tissue]
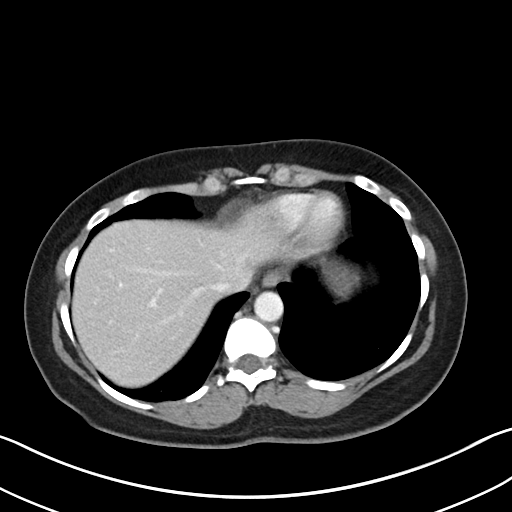
[im 87/93  soft-tissue]
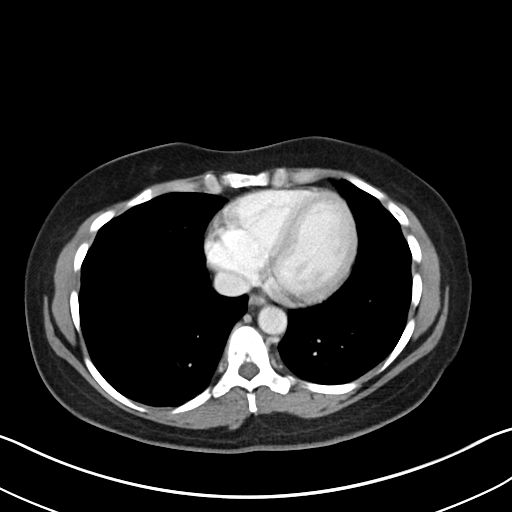

[Series 5: abdomen 3.0 mpr cor · coronal · 0.67mm/px · 3 of 80 slices shown]
[im 27/80  soft-tissue]
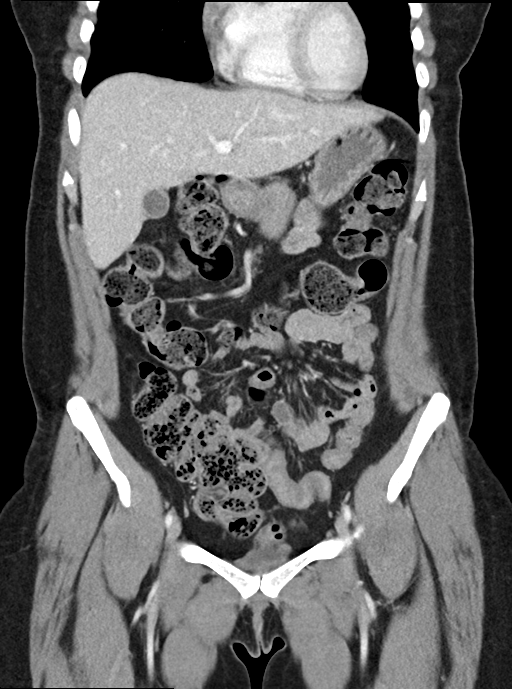
[im 36/80  soft-tissue]
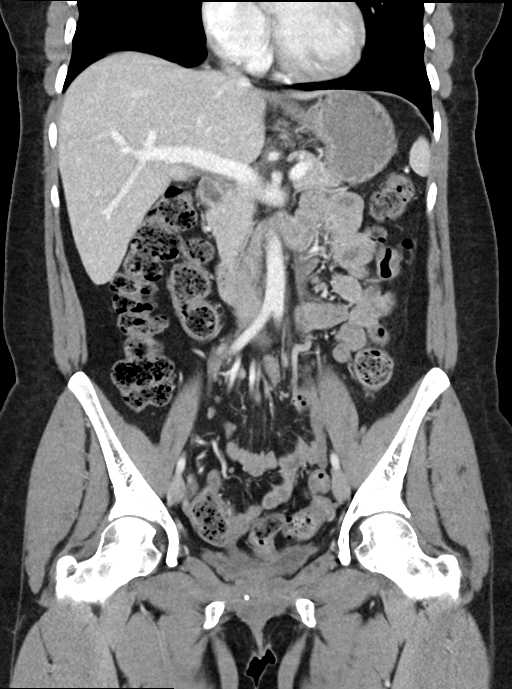
[im 44/80  soft-tissue]
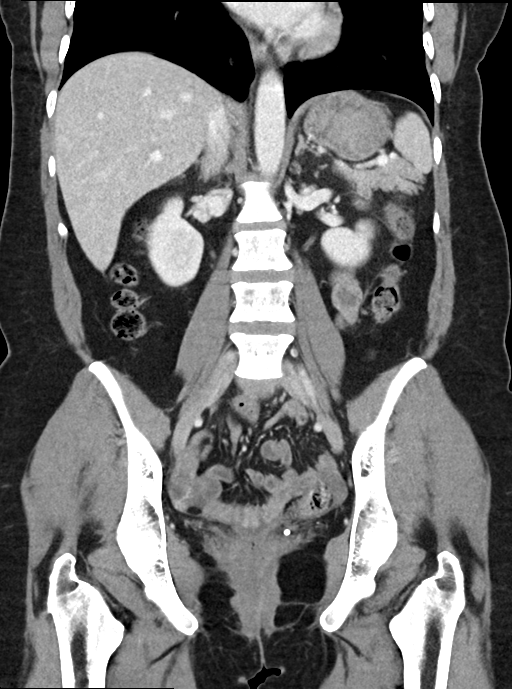

[16 of 46 positions shown; findings below may reference images not displayed]

RADIATION DOSE REDUCTION: This exam was performed according to the
departmental dose-optimization program which includes automated
exposure control, adjustment of the mA and/or kV according to
patient size and/or use of iterative reconstruction technique.

CONTRAST:  100mL OMNIPAQUE IOHEXOL 300 MG/ML  SOLN
FINDINGS: Lower Chest: No acute findings.

Hepatobiliary: A few tiny sub-cm low attention lesions in the left
hepatic lobe are too small to characterize, but most likely
represent tiny cysts. No suspicious hepatic lesions identified.
Gallbladder is unremarkable. No evidence of biliary ductal
dilatation.

Pancreas:  No mass or inflammatory changes.

Spleen: Within normal limits in size and appearance.

Adrenals/Urinary Tract: No masses identified. No evidence of
ureteral calculi or hydronephrosis. Urinary bladder appears empty.

Stomach/Bowel: No evidence of obstruction, inflammatory process or
abnormal fluid collections. Normal appendix visualized.

Vascular/Lymphatic: No pathologically enlarged lymph nodes. No acute
vascular findings.

Reproductive: Prior hysterectomy noted. Adnexal regions are
unremarkable in appearance.

Other:  None.

Musculoskeletal:  No suspicious bone lesions identified.
IMPRESSION: No acute findings. No evidence of appendicitis or other significant
abnormality.

## 2022-11-11 ENCOUNTER — Ambulatory Visit (HOSPITAL_COMMUNITY): Payer: Medicaid Other | Admitting: Licensed Clinical Social Worker

## 2022-11-17 ENCOUNTER — Ambulatory Visit (INDEPENDENT_AMBULATORY_CARE_PROVIDER_SITE_OTHER): Payer: No Typology Code available for payment source | Admitting: Licensed Clinical Social Worker

## 2022-11-17 ENCOUNTER — Encounter (HOSPITAL_COMMUNITY): Payer: Self-pay | Admitting: Licensed Clinical Social Worker

## 2022-11-17 DIAGNOSIS — F4310 Post-traumatic stress disorder, unspecified: Secondary | ICD-10-CM

## 2022-11-17 DIAGNOSIS — F331 Major depressive disorder, recurrent, moderate: Secondary | ICD-10-CM

## 2022-11-17 NOTE — Progress Notes (Signed)
Virtual Visit via video Note   I connected with Susan Guerra on 11/17/22 at 1:00 pm by video enabled telemedicine application and verified that I am speaking with the correct person using two identifiers.   Location: Patient: home Provider: home office   I discussed the limitations of evaluation and management by telemedicine and the availability of in person appointments. The patient expressed understanding and agreed to proceed.   History of Present Illness: Pt is referred to therapy for PTSD (MST) and MDD by the [facility]. Her biggest stressors are her health, taking care of her children (15 & 6) transitioning out of her 12 year relationship. Pt has fibromyalgia and other health issues, which affects her ability to take care of all her family's needs.  She has previous therapy.  Treatment Goals Addressed: Pt will recall the traumatic event without becoming too overwhelmed with emotions 50% of the time, evidenced by self report. Meet with clinician in person weekly for therapy to monitor for progress towards goals and address any barriers to success; Reduce depression from average severity level of 6/10 down to a 4/10 in next 90 days by engaging in 1-2 positive coping skills daily as part of developing self-care routine; Reduce average anxiety level from 7/10 down to 5/10 in next 90 days by utilizing 1-2 relaxation skills/grounding skills per day, such as mindful breathing, progressive muscle relaxation, positive visualizations.  Progression towards Goal: Progressing     Observations/Objective:  Patient participated in a discussion on [place], applying for disability and Social Security disability. Pt joined the group tonight. 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 Other group members introduced themselves and participated in the discussion offering experience and suggestions.  Collaboration of Care: Other: Encouraged pt to contact Hospice  for grief counseling  Patient/Guardian was advised Release of Information must be obtained prior to any record release in order to collaborate their care with an outside provider.   Patient/Guardian was advised if they have not already done so to contact the registration department to sign all necessary forms in order for [place] to release information regarding their care.   Consent: Patient/Guardian gives verbal consent for treatment and assignment of benefits for services provided during this visit. Patient/Guardian expressed understanding and agreed to proceed.  Assessment and Plan: Counselor will continue to meet with patient to address treatment plan goals. Patient will continue to follow recommendations of providers and implement skills learned in session.        Follow Up Instructions: I discussed the assessment and treatment plan with the patient. The patient was provided an opportunity to ask questions and all were answered. The patient agreed with the plan and demonstrated an understanding of the plan.   The patient was advised to call back or seek an in-person evaluation if the symptoms worsen or if the condition fails to improve as anticipated.   I provided 90 minutes of non-face-to-face time during this encounter.     Railyn House S, LCAS

## 2022-11-18 ENCOUNTER — Encounter (HOSPITAL_COMMUNITY): Payer: Self-pay | Admitting: Licensed Clinical Social Worker

## 2022-11-18 ENCOUNTER — Ambulatory Visit (INDEPENDENT_AMBULATORY_CARE_PROVIDER_SITE_OTHER): Payer: No Typology Code available for payment source | Admitting: Licensed Clinical Social Worker

## 2022-11-18 DIAGNOSIS — F331 Major depressive disorder, recurrent, moderate: Secondary | ICD-10-CM

## 2022-11-18 DIAGNOSIS — F4310 Post-traumatic stress disorder, unspecified: Secondary | ICD-10-CM

## 2022-11-18 NOTE — Progress Notes (Signed)
Virtual Visit via video Note   I connected with Susan Guerra on 11/18/22 at 1:00 pm by video enabled telemedicine application and verified that I am speaking with the correct person using two identifiers.   Location: Patient: home Provider: home office   I discussed the limitations of evaluation and management by telemedicine and the availability of in person appointments. The patient expressed understanding and agreed to proceed.   History of Present Illness: Pt is referred to therapy for PTSD (MST) and MDD by the [facility]. Her biggest stressors are her health, taking care of her children (15 & 6) transitioning out of her 12 year relationship. Pt has fibromyalgia and other health issues, which affects her ability to take care of all her family's needs.  She has previous therapy.  Treatment Goals Addressed: Pt will recall the traumatic event without becoming too overwhelmed with emotions 50% of the time, evidenced by self report. Meet with clinician in person weekly for therapy to monitor for progress towards goals and address any barriers to success; Reduce depression from average severity level of 6/10 down to a 4/10 in next 90 days by engaging in 1-2 positive coping skills daily as part of developing self-care routine; Reduce average anxiety level from 7/10 down to 5/10 in next 90 days by utilizing 1-2 relaxation skills/grounding skills per day, such as mindful breathing, progressive muscle relaxation, positive visualizations.  Progression towards Goal: Progressing     Observations/Objective: Patient presented for today's session on time and was alert, oriented x5, with no evidence or self-report of SI/HI or A/V H. Patient reported ongoing compliance with medication and denied any use of alcohol or illicit substances.  Clinician inquired about patient's current emotional ratings, as well as any significant changes in thoughts, feelings or behavior since previous session. Patient's  emotional ratings: 6/10 for depression, 6/10 for anxiety, 4/10 for anger/irritability, recalling traumatic event without becoming too overwhelmed with emotions 50% of the time. All ratings are evidenced by self report. Cln explored trauma emotional ratings with pt who identified her trauma triggers and coping skills used. Counselor and patient discussed trauma triggers and responses that occurred in her life over the past week. Pt continues to have pain from fibromyalgia but is trying to work through the pain. She continues to have problems with her son's father which exacerbates her continued ailments.           Collaboration of Care: Other: Encouraged pt to contact Hospice for grief counseling  Patient/Guardian was advised Release of Information must be obtained prior to any record release in order to collaborate their care with an outside provider.   Patient/Guardian was advised if they have not already done so to contact the registration department to sign all necessary forms in order for [place] to release information regarding their care.   Consent: Patient/Guardian gives verbal consent for treatment and assignment of benefits for services provided during this visit. Patient/Guardian expressed understanding and agreed to proceed.  Assessment and Plan: Counselor will continue to meet with patient to address treatment plan goals. Patient will continue to follow recommendations of providers and implement skills learned in session.        Follow Up Instructions: I discussed the assessment and treatment plan with the patient. The patient was provided an opportunity to ask questions and all were answered. The patient agreed with the plan and demonstrated an understanding of the plan.   The patient was advised to call back or seek an in-person evaluation if  the symptoms worsen or if the condition fails to improve as anticipated.   I provided 45 minutes of non-face-to-face time during this  encounter.     Susan Guerra S, LCAS

## 2022-11-24 ENCOUNTER — Encounter (HOSPITAL_COMMUNITY): Payer: Self-pay | Admitting: Licensed Clinical Social Worker

## 2022-11-24 ENCOUNTER — Ambulatory Visit (INDEPENDENT_AMBULATORY_CARE_PROVIDER_SITE_OTHER): Payer: No Typology Code available for payment source | Admitting: Licensed Clinical Social Worker

## 2022-11-24 DIAGNOSIS — F4310 Post-traumatic stress disorder, unspecified: Secondary | ICD-10-CM | POA: Diagnosis not present

## 2022-11-24 DIAGNOSIS — F331 Major depressive disorder, recurrent, moderate: Secondary | ICD-10-CM

## 2022-11-24 NOTE — Progress Notes (Signed)
Virtual Visit via video Note   I connected with Susan Guerra on 11/24/22 at 5:00 pm by video enabled telemedicine application and verified that I am speaking with the correct person using two identifiers.   Location: Patient: home Provider: home office   I discussed the limitations of evaluation and management by telemedicine and the availability of in person appointments. The patient expressed understanding and agreed to proceed.   History of Present Illness: Pt is referred to therapy for PTSD (MST) and MDD by the [facility]. Her biggest stressors are her health, taking care of her children (15 & 6) transitioning out of her 12 year relationship. Pt has fibromyalgia and other health issues, which affects her ability to take care of all her family's needs.  She has previous therapy.  Treatment Goals Addressed: Pt will recall the traumatic event without becoming too overwhelmed with emotions 50% of the time, evidenced by self report. Meet with clinician in person weekly for therapy to monitor for progress towards goals and address any barriers to success; Reduce depression from average severity level of 6/10 down to a 4/10 in next 90 days by engaging in 1-2 positive coping skills daily as part of developing self-care routine; Reduce average anxiety level from 7/10 down to 5/10 in next 90 days by utilizing 1-2 relaxation skills/grounding skills per day, such as mindful breathing, progressive muscle relaxation, positive visualizations.  Progression towards Goal: Progressing     Observations/Objective:  Patient participated in a discussion on overcoming life's challenges, under stress, which makes it more difficult to overcome obstacles in life. Patient was encouraged to identify life challenges and explore strategies for overcoming obstacles.         Collaboration of Care: Other: Encouraged pt to contact Hospice for grief counseling  Patient/Guardian was advised Release of Information  must be obtained prior to any record release in order to collaborate their care with an outside provider.   Patient/Guardian was advised if they have not already done so to contact the registration department to sign all necessary forms in order for [place] to release information regarding their care.   Consent: Patient/Guardian gives verbal consent for treatment and assignment of benefits for services provided during this visit. Patient/Guardian expressed understanding and agreed to proceed.  Assessment and Plan: Counselor will continue to meet with patient to address treatment plan goals. Patient will continue to follow recommendations of providers and implement skills learned in session.        Follow Up Instructions: I discussed the assessment and treatment plan with the patient. The patient was provided an opportunity to ask questions and all were answered. The patient agreed with the plan and demonstrated an understanding of the plan.   The patient was advised to call back or seek an in-person evaluation if the symptoms worsen or if the condition fails to improve as anticipated.   I provided 90 minutes of non-face-to-face time during this encounter.     Janiyla Long S, LCAS

## 2022-11-25 ENCOUNTER — Ambulatory Visit (INDEPENDENT_AMBULATORY_CARE_PROVIDER_SITE_OTHER): Payer: No Typology Code available for payment source | Admitting: Licensed Clinical Social Worker

## 2022-11-25 DIAGNOSIS — F331 Major depressive disorder, recurrent, moderate: Secondary | ICD-10-CM | POA: Diagnosis not present

## 2022-11-25 DIAGNOSIS — F4310 Post-traumatic stress disorder, unspecified: Secondary | ICD-10-CM

## 2022-12-01 ENCOUNTER — Ambulatory Visit
Admission: EM | Admit: 2022-12-01 | Discharge: 2022-12-01 | Disposition: A | Discharge status: Home / Self Care | Payer: No Typology Code available for payment source | Attending: Family Medicine | Admitting: Family Medicine

## 2022-12-01 ENCOUNTER — Ambulatory Visit (HOSPITAL_COMMUNITY): Payer: No Typology Code available for payment source | Admitting: Licensed Clinical Social Worker

## 2022-12-01 ENCOUNTER — Ambulatory Visit (INDEPENDENT_AMBULATORY_CARE_PROVIDER_SITE_OTHER): Payer: No Typology Code available for payment source

## 2022-12-01 ENCOUNTER — Encounter (HOSPITAL_COMMUNITY): Payer: Self-pay | Admitting: Licensed Clinical Social Worker

## 2022-12-01 DIAGNOSIS — F331 Major depressive disorder, recurrent, moderate: Secondary | ICD-10-CM

## 2022-12-01 DIAGNOSIS — R0789 Other chest pain: Secondary | ICD-10-CM

## 2022-12-01 DIAGNOSIS — F4310 Post-traumatic stress disorder, unspecified: Secondary | ICD-10-CM

## 2022-12-01 DIAGNOSIS — R079 Chest pain, unspecified: Secondary | ICD-10-CM

## 2022-12-01 DIAGNOSIS — S39012A Strain of muscle, fascia and tendon of lower back, initial encounter: Secondary | ICD-10-CM

## 2022-12-01 DIAGNOSIS — M545 Low back pain, unspecified: Secondary | ICD-10-CM | POA: Diagnosis not present

## 2022-12-01 HISTORY — DX: Generalized anxiety disorder: F41.1

## 2022-12-01 MED ORDER — NAPROXEN 500 MG PO TABS: 500.0000 mg | ORAL_TABLET | Freq: Two times a day (BID) | ORAL | 0 refills | Status: AC

## 2022-12-01 MED ORDER — TIZANIDINE HCL 2 MG PO TABS
2.0000 mg | ORAL_TABLET | Freq: Four times a day (QID) | ORAL | 0 refills | Status: AC | PRN
Start: 2022-12-01 — End: ?
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
## 2022-12-01 NOTE — Progress Notes (Addendum)
Virtual Visit via video Note   I connected with Susan Guerra on 12/01/22 at 5:00 pm by video enabled telemedicine application and verified that I am speaking with the correct person using two identifiers.   Location: Patient: home Provider: home office   I discussed the limitations of evaluation and management by telemedicine and the availability of in person appointments. The patient expressed understanding and agreed to proceed.   History of Present Illness: Pt is referred to therapy for PTSD (MST) and MDD by the [facility]. Her biggest stressors are her health, taking care of her children (15 & 6) transitioning out of her 12 year relationship. Pt has fibromyalgia and other health issues, which affects her ability to take care of all her family's needs.  She has previous therapy.  Treatment Goals Addressed: Pt will recall the traumatic event without becoming too overwhelmed with emotions 50% of the time, evidenced by self report. Meet with clinician in person weekly for therapy to monitor for progress towards goals and address any barriers to success; Reduce depression from average severity level of 6/10 down to a 4/10 in next 90 days by engaging in 1-2 positive coping skills daily as part of developing self-care routine; Reduce average anxiety level from 7/10 down to 5/10 in next 90 days by utilizing 1-2 relaxation skills/grounding skills per day, such as mindful breathing, progressive muscle relaxation, positive visualizations.  Progression towards Goal: Progressing     Observations/Objective: Cln provided education to the group on diversion strategies that allows you to stop thinking about the situation contributing to distressed emotions for a period of time. Cln suggested patient use the diversion strategies when the warning signs become overwhelming emotions.        Collaboration of Care: Other: Encouraged pt to contact Hospice for grief counseling  Patient/Guardian was  advised Release of Information must be obtained prior to any record release in order to collaborate their care with an outside provider.   Patient/Guardian was advised if they have not already done so to contact the registration department to sign all necessary forms in order for [place] to release information regarding their care.   Consent: Patient/Guardian gives verbal consent for treatment and assignment of benefits for services provided during this visit. Patient/Guardian expressed understanding and agreed to proceed.  Assessment and Plan: Counselor will continue to meet with patient to address treatment plan goals. Patient will continue to follow recommendations of providers and implement skills learned in session.        Follow Up Instructions: I discussed the assessment and treatment plan with the patient. The patient was provided an opportunity to ask questions and all were answered. The patient agreed with the plan and demonstrated an understanding of the plan.   The patient was advised to call back or seek an in-person evaluation if the symptoms worsen or if the condition fails to improve as anticipated.   I provided 90 minutes of non-face-to-face time during this encounter.     Susan Guerra S, LCAS
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
## 2022-12-02 ENCOUNTER — Encounter (HOSPITAL_COMMUNITY): Payer: Self-pay | Admitting: Licensed Clinical Social Worker

## 2022-12-02 ENCOUNTER — Ambulatory Visit (HOSPITAL_COMMUNITY): Payer: No Typology Code available for payment source | Admitting: Licensed Clinical Social Worker

## 2022-12-02 DIAGNOSIS — F4310 Post-traumatic stress disorder, unspecified: Secondary | ICD-10-CM

## 2022-12-02 DIAGNOSIS — F331 Major depressive disorder, recurrent, moderate: Secondary | ICD-10-CM | POA: Diagnosis not present

## 2022-12-02 NOTE — Progress Notes (Signed)
Virtual Visit via video Note   I connected with Susan Guerra on 12/02/22 at 1:00 pm by video enabled telemedicine application and verified that I am speaking with the correct person using two identifiers.   Location: Patient: home Provider: home office   I discussed the limitations of evaluation and management by telemedicine and the availability of in person appointments. The patient expressed understanding and agreed to proceed.   History of Present Illness: Pt is referred to therapy for PTSD (MST) and MDD by the [facility]. Her biggest stressors are her health, taking care of her children (15 & 6) transitioning out of her 12 year relationship. Pt has fibromyalgia and other health issues, which affects her ability to take care of all her family's needs.  She has previous therapy.  Treatment Goals Addressed: Pt will recall the traumatic event without becoming too overwhelmed with emotions 50% of the time, evidenced by self report. Meet with clinician in person weekly for therapy to monitor for progress towards goals and address any barriers to success; Reduce depression from average severity level of 6/10 down to a 4/10 in next 90 days by engaging in 1-2 positive coping skills daily as part of developing self-care routine; Reduce average anxiety level from 7/10 down to 5/10 in next 90 days by utilizing 1-2 relaxation skills/grounding skills per day, such as mindful breathing, progressive muscle relaxation, positive visualizations.  Progression towards Goal: Progressing     Observations/Objective: Patient presented for today's session on time and was alert, oriented x5, with no evidence or self-report of SI/HI or A/V H. Patient reported ongoing compliance with medication and denied any use of alcohol or illicit substances.  Clinician inquired about patient's current emotional ratings, as well as any significant changes in thoughts, feelings or behavior since previous session. Patient's  emotional ratings: 6/10 for depression, 6/10 for anxiety, 4/10 for anger/irritability, recalling traumatic event without becoming too overwhelmed with emotions 50% of the time. All ratings are evidenced by self report. Cln explored trauma emotional ratings with pt who identified her trauma triggers and coping skills used. Counselor and patient discussed trauma triggers and responses that occurred in her life over the past week. "My triggers are consistent with continued discussion with my x. Cln asked open ended questions. "Why do you continue to speak with him?" Cln provided education on communication skills: Asking for an appointment, problem solving, using "I" statements. Cln and pt role played during session.       Collaboration of Care: Other: Encouraged pt to contact [facility]osp[facility] for grief counseling  Patient/Guardian was advised Release of Information must be obtained prior to any record release in order to collaborate their care with an outside provider.   Patient/Guardian was advised if they have not already done so to contact the registration department to sign all necessary forms in order for [place] to release information regarding their care.   Consent: Patient/Guardian gives verbal consent for treatment and assignment of benefits for services provided during this visit. Patient/Guardian expressed understanding and agreed to proceed.  Assessment and Plan: Counselor will continue to meet with patient to address treatment plan goals. Patient will continue to follow recommendations of providers and implement skills learned in session.        Follow Up Instructions: I discussed the assessment and treatment plan with the patient. The patient was provided an opportunity to ask questions and all were answered. The patient agreed with the plan and demonstrated an understanding of the plan.   The  patient was advised to call back or seek an in-person evaluation if the symptoms worsen or if the condition  fails to improve as anticipated.   I provided 60 minutes of non-face-to-face time during this encounter.     Nikkolas Coomes S, LCAS

## 2022-12-08 ENCOUNTER — Ambulatory Visit (HOSPITAL_COMMUNITY): Payer: Medicaid Other | Admitting: Licensed Clinical Social Worker

## 2022-12-09 ENCOUNTER — Encounter (HOSPITAL_COMMUNITY): Payer: Self-pay | Admitting: Licensed Clinical Social Worker

## 2022-12-09 ENCOUNTER — Ambulatory Visit (INDEPENDENT_AMBULATORY_CARE_PROVIDER_SITE_OTHER): Payer: No Typology Code available for payment source | Admitting: Licensed Clinical Social Worker

## 2022-12-09 DIAGNOSIS — F331 Major depressive disorder, recurrent, moderate: Secondary | ICD-10-CM | POA: Diagnosis not present

## 2022-12-09 DIAGNOSIS — F4310 Post-traumatic stress disorder, unspecified: Secondary | ICD-10-CM

## 2022-12-09 NOTE — Progress Notes (Signed)
Virtual Visit via video Note   I connected with Susan Guerra on 12/09/22 at 1:00 pm by video enabled telemedicine application and verified that I am speaking with the correct person using two identifiers.   Location: Patient: home Provider: home office   I discussed the limitations of evaluation and management by telemedicine and the availability of in person appointments. The patient expressed understanding and agreed to proceed.   History of Present Illness: Pt is referred to therapy for PTSD (MST) and MDD by the [facility]. Her biggest stressors are her health, taking care of her children (15 & 6) transitioning out of her 12 year relationship. Pt has fibromyalgia and other health issues, which affects her ability to take care of all her family's needs.  She has previous therapy.  Treatment Goals Addressed: Pt will recall the traumatic event without becoming too overwhelmed with emotions 50% of the time, evidenced by self report. Meet with clinician in person weekly for therapy to monitor for progress towards goals and address any barriers to success; Reduce depression from average severity level of 6/10 down to a 4/10 in next 90 days by engaging in 1-2 positive coping skills daily as part of developing self-care routine; Reduce average anxiety level from 7/10 down to 5/10 in next 90 days by utilizing 1-2 relaxation skills/grounding skills per day, such as mindful breathing, progressive muscle relaxation, positive visualizations.  Progression towards Goal: Progressing     Observations/Objective: Patient presented for today's session on time and was alert, oriented x5, with no evidence or self-report of SI/HI or A/V H. Patient reported ongoing compliance with medication and denied any use of alcohol or illicit substances.  Clinician inquired about patient's current emotional ratings, as well as any significant changes in thoughts, feelings or behavior since previous session. Patient's  emotional ratings: 5/10 for depression, 5/10 for anxiety, 2/10 for anger/irritability, recalling traumatic event without becoming too overwhelmed with emotions 50% of the time. All ratings are evidenced by self report. Cln explored trauma emotional ratings with pt who identified her trauma triggers and coping skills used. Pt provided updates on health, family, x relationship, new relationship, job. Clinician utilized [facility] OARS to reflect and summarize thoughts and feelings     Collaboration of Care: Other: Continue with providers at [facility]  Patient/Guardian was advised Release of Information must be obtained prior to any record release in order to collaborate their care with an outside provider.   Patient/Guardian was advised if they have not already done so to contact the registration department to sign all necessary forms in order for [place] to release information regarding their care.   Consent: Patient/Guardian gives verbal consent for treatment and assignment of benefits for services provided during this visit. Patient/Guardian expressed understanding and agreed to proceed.  Assessment and Plan: Counselor will continue to meet with patient to address treatment plan goals. Patient will continue to follow recommendations of providers and implement skills learned in session.        Follow Up Instructions: I discussed the assessment and treatment plan with the patient. The patient was provided an opportunity to ask questions and all were answered. The patient agreed with the plan and demonstrated an understanding of the plan.   The patient was advised to call back or seek an in-person evaluation if the symptoms worsen or if the condition fails to improve as anticipated.   I provided 60 minutes of non-face-to-face time during this encounter.     Delmy Holdren S, LCAS

## 2022-12-15 ENCOUNTER — Ambulatory Visit (INDEPENDENT_AMBULATORY_CARE_PROVIDER_SITE_OTHER): Payer: No Typology Code available for payment source | Admitting: Licensed Clinical Social Worker

## 2022-12-15 ENCOUNTER — Encounter (HOSPITAL_COMMUNITY): Payer: Self-pay | Admitting: Licensed Clinical Social Worker

## 2022-12-15 DIAGNOSIS — F4310 Post-traumatic stress disorder, unspecified: Secondary | ICD-10-CM | POA: Diagnosis not present

## 2022-12-15 DIAGNOSIS — F331 Major depressive disorder, recurrent, moderate: Secondary | ICD-10-CM | POA: Diagnosis not present

## 2022-12-15 NOTE — Progress Notes (Signed)
Virtual Visit via video Note   I connected with Susan Guerra on 12/15/22 at 5:00 pm during group session by video enabled telemedicine application and verified that I am speaking with the correct person using two identifiers.   Location: Patient: home Provider: home office   I discussed the limitations of evaluation and management by telemedicine and the availability of in person appointments. The patient expressed understanding and agreed to proceed.   History of Present Illness: Pt is referred to therapy for PTSD (MST) and MDD by the [facility]. Her biggest stressors are her health, taking care of her children (15 & 6) transitioning out of her 12 year relationship. Pt has fibromyalgia and other health issues, which affects her ability to take care of all her family's needs.  She has previous therapy.  Treatment Goals Addressed: Pt will recall the traumatic event without becoming too overwhelmed with emotions 50% of the time, evidenced by self report. Meet with clinician in person weekly for therapy to monitor for progress towards goals and address any barriers to success; Reduce depression from average severity level of 6/10 down to a 4/10 in next 90 days by engaging in 1-2 positive coping skills daily as part of developing self-care routine; Reduce average anxiety level from 7/10 down to 5/10 in next 90 days by utilizing 1-2 relaxation skills/grounding skills per day, such as mindful breathing, progressive muscle relaxation, positive visualizations.  Progression towards Goal: Progressing     Observations/Objective: Pt participated in a discussion on living life with optimism. If you focus on optimism, you will change your life and live a better, more positive life. Pt was encouraged to live life with more optimism.        Collaboration of Care: Other: None at this session    Patient/Guardian was advised Release of Information must be obtained prior to any record release in  order to collaborate their care with an outside provider.   Patient/Guardian was advised if they have not already done so to contact the registration department to sign all necessary forms in order for [place] to release information regarding their care.   Consent: Patient/Guardian gives verbal consent for treatment and assignment of benefits for services provided during this visit. Patient/Guardian expressed understanding and agreed to proceed.  Assessment and Plan: Counselor will continue to meet with patient to address treatment plan goals. Patient will continue to follow recommendations of providers and implement skills learned in session.        Follow Up Instructions: I discussed the assessment and treatment plan with the patient. The patient was provided an opportunity to ask questions and all were answered. The patient agreed with the plan and demonstrated an understanding of the plan.   The patient was advised to call back or seek an in-person evaluation if the symptoms worsen or if the condition fails to improve as anticipated.   I provided 90 minutes of non-face-to-face time during the group encounter.     Conlan Miceli S, LCAS

## 2022-12-16 ENCOUNTER — Ambulatory Visit (INDEPENDENT_AMBULATORY_CARE_PROVIDER_SITE_OTHER): Payer: No Typology Code available for payment source | Admitting: Licensed Clinical Social Worker

## 2022-12-16 DIAGNOSIS — F4310 Post-traumatic stress disorder, unspecified: Secondary | ICD-10-CM

## 2022-12-16 DIAGNOSIS — F331 Major depressive disorder, recurrent, moderate: Secondary | ICD-10-CM

## 2022-12-17 ENCOUNTER — Encounter (HOSPITAL_COMMUNITY): Payer: Self-pay | Admitting: Licensed Clinical Social Worker

## 2022-12-17 NOTE — Progress Notes (Signed)
Virtual Visit via video Note   I connected with Susan Guerra on 11/25/22 at 1:00 pm by video enabled telemedicine application and verified that I am speaking with the correct person using two identifiers.   Location: Patient: home Provider: home office   I discussed the limitations of evaluation and management by telemedicine and the availability of in person appointments. The patient expressed understanding and agreed to proceed.   History of Present Illness: Pt is referred to therapy for PTSD (MST) and MDD by the [facility]. Her biggest stressors are her health, taking care of her children (15 & 6) transitioning out of her 12 year relationship. Pt has fibromyalgia and other health issues, which affects her ability to take care of all her family's needs.  She has previous therapy.  Treatment Goals Addressed: Pt will recall the traumatic event without becoming too overwhelmed with emotions 50% of the time, evidenced by self report. Meet with clinician in person weekly for therapy to monitor for progress towards goals and address any barriers to success; Reduce depression from average severity level of 6/10 down to a 4/10 in next 90 days by engaging in 1-2 positive coping skills daily as part of developing self-care routine; Reduce average anxiety level from 7/10 down to 5/10 in next 90 days by utilizing 1-2 relaxation skills/grounding skills per day, such as mindful breathing, progressive muscle relaxation, positive visualizations.  Progression towards Goal: Progressing     Observations/Objective: Patient presented for today's session on time and was alert, oriented x5, with no evidence or self-report of SI/HI or A/V H. Patient reported ongoing compliance with medication and denied any use of alcohol or illicit substances.  Clinician inquired about patient's current emotional ratings, as well as any significant changes in thoughts, feelings or behavior since previous session. Patient's  emotional ratings: 6/10 for depression, 6/10 for anxiety, 4/10 for anger/irritability, recalling traumatic event without becoming too overwhelmed with emotions 50% of the time. All ratings are evidenced by self report. Cln explored trauma emotional ratings with pt who identified her trauma triggers and coping skills used. Counselor and patient discussed trauma triggers and responses that occurred in her life over the past week. Pt continues to have pain from fibromyalgia but is trying to work through the pain. She continues to have problems with her son's father which exacerbates her continued ailments. Discussed, "If nothing changes, nothing changes." Is this what you want for the rest of your life? What can you do differently?           Collaboration of Care: Other: Encouraged pt to contact [facility] for grief counseling  Patient/Guardian was advised Release of Information must be obtained prior to any record release in order to collaborate their care with an outside provider.   Patient/Guardian was advised if they have not already done so to contact the registration department to sign all necessary forms in order for [place] to release information regarding their care.   Consent: Patient/Guardian gives verbal consent for treatment and assignment of benefits for services provided during this visit. Patient/Guardian expressed understanding and agreed to proceed.  Assessment and Plan: Counselor will continue to meet with patient to address treatment plan goals. Patient will continue to follow recommendations of providers and implement skills learned in session.        Follow Up Instructions: I discussed the assessment and treatment plan with the patient. The patient was provided an opportunity to ask questions and all were answered. The patient agreed with the plan  and demonstrated an understanding of the plan.   The patient was advised to call back or seek an in-person evaluation if the symptoms  worsen or if the condition fails to improve as anticipated.   I provided 45 minutes of non-face-to-face time during this encounter.     Arn Mcomber S, LCAS

## 2022-12-22 ENCOUNTER — Encounter (HOSPITAL_COMMUNITY): Payer: Self-pay | Admitting: Licensed Clinical Social Worker

## 2022-12-22 ENCOUNTER — Ambulatory Visit (HOSPITAL_COMMUNITY): Payer: No Typology Code available for payment source | Admitting: Licensed Clinical Social Worker

## 2022-12-22 DIAGNOSIS — F4310 Post-traumatic stress disorder, unspecified: Secondary | ICD-10-CM | POA: Diagnosis not present

## 2022-12-22 DIAGNOSIS — F331 Major depressive disorder, recurrent, moderate: Secondary | ICD-10-CM

## 2022-12-22 NOTE — Progress Notes (Signed)
Virtual Visit via video Note   I connected with Susan Guerra on 12/22/22 at 5:00 pm during group session by video enabled telemedicine application and verified that I am speaking with the correct person using two identifiers.   Location: Patient: home Provider: home office   I discussed the limitations of evaluation and management by telemedicine and the availability of in person appointments. The patient expressed understanding and agreed to proceed.   History of Present Illness: Pt is referred to therapy for PTSD (MST) and MDD by the [facility]. Her biggest stressors are her health, taking care of her children (15 & 6) transitioning out of her 12 year relationship. Pt has fibromyalgia and other health issues, which affects her ability to take care of all her family's needs.  She has previous therapy.  Treatment Goals Addressed: Pt will recall the traumatic event without becoming too overwhelmed with emotions 50% of the time, evidenced by self report. Meet with clinician in person weekly for therapy to monitor for progress towards goals and address any barriers to success; Reduce depression from average severity level of 6/10 down to a 4/10 in next 90 days by engaging in 1-2 positive coping skills daily as part of developing self-care routine; Reduce average anxiety level from 7/10 down to 5/10 in next 90 days by utilizing 1-2 relaxation skills/grounding skills per day, such as mindful breathing, progressive muscle relaxation, positive visualizations.  Progression towards Goal: Progressing     Observations/Objective:    Patient participated in a group discussion on dealing with chronic pain. Clinician used CBT to assist patients in improving their quality of life, activities of daily living, focusing on reducing pain and distress by modifying physical sensation, catastrophic thinking and maladaptive behaviors. Patient was encouraged to use CBT to assist patient in improving quality of  life.   Collaboration of Care: Other: None at this session    Patient/Guardian was advised Release of Information must be obtained prior to any record release in order to collaborate their care with an outside provider.   Patient/Guardian was advised if they have not already done so to contact the registration department to sign all necessary forms in order for [place] to release information regarding their care.   Consent: Patient/Guardian gives verbal consent for treatment and assignment of benefits for services provided during this visit. Patient/Guardian expressed understanding and agreed to proceed.  Assessment and Plan: Counselor will continue to meet with patient to address treatment plan goals. Patient will continue to follow recommendations of providers and implement skills learned in session.        Follow Up Instructions: I discussed the assessment and treatment plan with the patient. The patient was provided an opportunity to ask questions and all were answered. The patient agreed with the plan and demonstrated an understanding of the plan.   The patient was advised to call back or seek an in-person evaluation if the symptoms worsen or if the condition fails to improve as anticipated.   I provided 60 minutes of non-face-to-face time during the group encounter.     Niyanna Asch S, LCAS

## 2022-12-23 ENCOUNTER — Ambulatory Visit (HOSPITAL_COMMUNITY): Payer: No Typology Code available for payment source | Admitting: Licensed Clinical Social Worker

## 2022-12-23 DIAGNOSIS — F331 Major depressive disorder, recurrent, moderate: Secondary | ICD-10-CM

## 2022-12-23 DIAGNOSIS — F4310 Post-traumatic stress disorder, unspecified: Secondary | ICD-10-CM | POA: Diagnosis not present

## 2022-12-23 DIAGNOSIS — F339 Major depressive disorder, recurrent, unspecified: Secondary | ICD-10-CM | POA: Diagnosis not present

## 2022-12-29 ENCOUNTER — Encounter (HOSPITAL_COMMUNITY): Payer: Self-pay | Admitting: Licensed Clinical Social Worker

## 2022-12-29 ENCOUNTER — Ambulatory Visit (INDEPENDENT_AMBULATORY_CARE_PROVIDER_SITE_OTHER): Payer: No Typology Code available for payment source | Admitting: Licensed Clinical Social Worker

## 2022-12-29 DIAGNOSIS — F331 Major depressive disorder, recurrent, moderate: Secondary | ICD-10-CM | POA: Diagnosis not present

## 2022-12-29 DIAGNOSIS — F4310 Post-traumatic stress disorder, unspecified: Secondary | ICD-10-CM | POA: Diagnosis not present

## 2022-12-29 NOTE — Progress Notes (Signed)
Virtual Visit via video Note   I connected with Susan Guerra on 12/29/22 at 5:00 pm during group session by video enabled telemedicine application and verified that I am speaking with the correct person using two identifiers.   Location: Patient: home Provider: home office   I discussed the limitations of evaluation and management by telemedicine and the availability of in person appointments. The patient expressed understanding and agreed to proceed.   History of Present Illness: Pt is referred to therapy for PTSD (MST) and MDD by the [facility]. Her biggest stressors are her health, taking care of her children (15 & 6) transitioning out of her 12 year relationship. Pt has fibromyalgia and other health issues, which affects her ability to take care of all her family's needs.  She has previous therapy.  Treatment Goals Addressed: Pt will recall the traumatic event without becoming too overwhelmed with emotions 50% of the time, evidenced by self report. Meet with clinician in person weekly for therapy to monitor for progress towards goals and address any barriers to success; Reduce depression from average severity level of 6/10 down to a 4/10 in next 90 days by engaging in 1-2 positive coping skills daily as part of developing self-care routine; Reduce average anxiety level from 7/10 down to 5/10 in next 90 days by utilizing 1-2 relaxation skills/grounding skills per day, such as mindful breathing, progressive muscle relaxation, positive visualizations.  Progression towards Goal: Progressing     Observations/Objective:    Patient participated in a group discussion on upcoming Memorial Day celebrations: Psychologist, occupational and mourning fallen soldiers, putting flags on fellow veteran's graves, attending [employer], rendering respect of fellow soldiers. Clinician honored each veteran in this group for their respective service in the [employer] of Care: Other: None at this  session    Patient/Guardian was advised Release of Information must be obtained prior to any record release in order to collaborate their care with an outside provider.   Patient/Guardian was advised if they have not already done so to contact the registration department to sign all necessary forms in order for [place] to release information regarding their care.   Consent: Patient/Guardian gives verbal consent for treatment and assignment of benefits for services provided during this visit. Patient/Guardian expressed understanding and agreed to proceed.  Assessment and Plan: Counselor will continue to meet with patient to address treatment plan goals. Patient will continue to follow recommendations of providers and implement skills learned in session.        Follow Up Instructions: I discussed the assessment and treatment plan with the patient. The patient was provided an opportunity to ask questions and all were answered. The patient agreed with the plan and demonstrated an understanding of the plan.   The patient was advised to call back or seek an in-person evaluation if the symptoms worsen or if the condition fails to improve as anticipated.   I provided 75 minutes of non-face-to-face time during the group encounter.     Susan Guerra S, LCAS

## 2022-12-30 ENCOUNTER — Ambulatory Visit (INDEPENDENT_AMBULATORY_CARE_PROVIDER_SITE_OTHER): Payer: No Typology Code available for payment source | Admitting: Licensed Clinical Social Worker

## 2022-12-30 DIAGNOSIS — F4310 Post-traumatic stress disorder, unspecified: Secondary | ICD-10-CM

## 2022-12-30 DIAGNOSIS — F331 Major depressive disorder, recurrent, moderate: Secondary | ICD-10-CM | POA: Diagnosis not present

## 2022-12-31 ENCOUNTER — Encounter (HOSPITAL_COMMUNITY): Payer: Self-pay | Admitting: Licensed Clinical Social Worker

## 2022-12-31 NOTE — Progress Notes (Signed)
Virtual Visit via video Note   I connected with Susan Guerra on 12/16/22 at 1:00 pm by video enabled telemedicine application and verified that I am speaking with the correct person using two identifiers.   Location: Patient: home Provider: home office   I discussed the limitations of evaluation and management by telemedicine and the availability of in person appointments. The patient expressed understanding and agreed to proceed.   History of Present Illness: Pt is referred to therapy for PTSD (MST) and MDD by the [facility]. Her biggest stressors are her health, taking care of her children (15 & 6) transitioning out of her 12 year relationship. Pt has fibromyalgia and other health issues, which affects her ability to take care of all her family's needs.  She has previous therapy.  Treatment Goals Addressed: Pt will recall the traumatic event without becoming too overwhelmed with emotions 50% of the time, evidenced by self report. Meet with clinician in person weekly for therapy to monitor for progress towards goals and address any barriers to success; Reduce depression from average severity level of 6/10 down to a 4/10 in next 90 days by engaging in 1-2 positive coping skills daily as part of developing self-care routine; Reduce average anxiety level from 7/10 down to 5/10 in next 90 days by utilizing 1-2 relaxation skills/grounding skills per day, such as mindful breathing, progressive muscle relaxation, positive visualizations.  Progression towards Goal: Progressing     Observations/Objective: Patient presented for today's session on time and was alert, oriented x5, with no evidence or self-report of SI/HI or A/V H. Patient reported ongoing compliance with medication and denied any use of alcohol or illicit substances.  Clinician inquired about patient's current emotional ratings, as well as any significant changes in thoughts, feelings or behavior since previous session. Patient's  emotional ratings: 5/10 for depression, 5/10 for anxiety, 2/10 for anger/irritability, recalling traumatic event without becoming too overwhelmed with emotions 50% of the time. All ratings are evidenced by self report. Cln explored trauma emotional ratings with pt who identified her trauma triggers and coping skills used. Pt provided updates on health, family, x relationship, new relationship, job. Clinician provided thought stopping tools, as well as reality testing to provide pt with the tools to deal with her trauma triggers and coping skills. Clinician utilized CBT to address thought processes support and confidence in her decisions.    Collaboration of Care: Other: Continue with providers at [facility]  Patient/Guardian was advised Release of Information must be obtained prior to any record release in order to collaborate their care with an outside provider.   Patient/Guardian was advised if they have not already done so to contact the registration department to sign all necessary forms in order for [place] to release information regarding their care.   Consent: Patient/Guardian gives verbal consent for treatment and assignment of benefits for services provided during this visit. Patient/Guardian expressed understanding and agreed to proceed.  Assessment and Plan: Counselor will continue to meet with patient to address treatment plan goals. Patient will continue to follow recommendations of providers and implement skills learned in session.        Follow Up Instructions: I discussed the assessment and treatment plan with the patient. The patient was provided an opportunity to ask questions and all were answered. The patient agreed with the plan and demonstrated an understanding of the plan.   The patient was advised to call back or seek an in-person evaluation if the symptoms worsen or if the condition  fails to improve as anticipated.   I provided 60 minutes of non-face-to-face time during this  encounter.     Susan Guerra S, LCAS

## 2023-01-05 ENCOUNTER — Ambulatory Visit (INDEPENDENT_AMBULATORY_CARE_PROVIDER_SITE_OTHER): Payer: No Typology Code available for payment source | Admitting: Licensed Clinical Social Worker

## 2023-01-05 ENCOUNTER — Encounter (HOSPITAL_COMMUNITY): Payer: Self-pay | Admitting: Licensed Clinical Social Worker

## 2023-01-05 DIAGNOSIS — F4310 Post-traumatic stress disorder, unspecified: Secondary | ICD-10-CM | POA: Diagnosis not present

## 2023-01-05 DIAGNOSIS — F331 Major depressive disorder, recurrent, moderate: Secondary | ICD-10-CM

## 2023-01-05 NOTE — Progress Notes (Signed)
Virtual Visit via video Note   I connected with Carey Bullocks on 01/05/23 at 5:00 pm during group session by video enabled telemedicine application and verified that I am speaking with the correct person using two identifiers.   Location: Patient: home Provider: home office   I discussed the limitations of evaluation and management by telemedicine and the availability of in person appointments. The patient expressed understanding and agreed to proceed.   History of Present Illness: Pt is referred to therapy for PTSD (MST) and MDD by the [facility]. Her biggest stressors are her health, taking care of her children (15 & 6) transitioning out of her 12 year relationship. Pt has fibromyalgia and other health issues, which affects her ability to take care of all her family's needs.  She has previous therapy.  Treatment Goals Addressed: Pt will recall the traumatic event without becoming too overwhelmed with emotions 50% of the time, evidenced by self report. Meet with clinician in person weekly for therapy to monitor for progress towards goals and address any barriers to success; Reduce depression from average severity level of 6/10 down to a 4/10 in next 90 days by engaging in 1-2 positive coping skills daily as part of developing self-care routine; Reduce average anxiety level from 7/10 down to 5/10 in next 90 days by utilizing 1-2 relaxation skills/grounding skills per day, such as mindful breathing, progressive muscle relaxation, positive visualizations.  Progression towards Goal: Progressing     Observations/Objective:    Patient participated in a group discussion on upcoming Memorial Day celebrations: Psychologist, occupational and mourning fallen soldiers, putting flags on fellow veteran's graves, attending [employer], rendering respect of fellow soldiers. Clinician honored each veteran in this group for their respective service in the [employer]. Pt shared what memorial day means to her and her  family.    Collaboration of Care: Other: None at this session    Patient/Guardian was advised Release of Information must be obtained prior to any record release in order to collaborate their care with an outside provider.   Patient/Guardian was advised if they have not already done so to contact the registration department to sign all necessary forms in order for [place] to release information regarding their care.   Consent: Patient/Guardian gives verbal consent for treatment and assignment of benefits for services provided during this visit. Patient/Guardian expressed understanding and agreed to proceed.  Assessment and Plan: Counselor will continue to meet with patient to address treatment plan goals. Patient will continue to follow recommendations of providers and implement skills learned in session.        Follow Up Instructions: I discussed the assessment and treatment plan with the patient. The patient was provided an opportunity to ask questions and all were answered. The patient agreed with the plan and demonstrated an understanding of the plan.   The patient was advised to call back or seek an in-person evaluation if the symptoms worsen or if the condition fails to improve as anticipated.   I provided 75 minutes of non-face-to-face time during the group encounter.     Consuello Lassalle S, LCAS

## 2023-01-06 ENCOUNTER — Ambulatory Visit (INDEPENDENT_AMBULATORY_CARE_PROVIDER_SITE_OTHER): Payer: No Typology Code available for payment source | Admitting: Licensed Clinical Social Worker

## 2023-01-06 ENCOUNTER — Encounter (HOSPITAL_COMMUNITY): Payer: Self-pay | Admitting: Licensed Clinical Social Worker

## 2023-01-06 DIAGNOSIS — F4310 Post-traumatic stress disorder, unspecified: Secondary | ICD-10-CM

## 2023-01-06 DIAGNOSIS — F331 Major depressive disorder, recurrent, moderate: Secondary | ICD-10-CM

## 2023-01-06 NOTE — Progress Notes (Signed)
Comprehensive Clinical Assessment (CCA) Note  Virtual Visit via Video Note   I connected with Susan Guerra on 01/05/21 at 1:00-2:00 PM EDT by a video enabled telemedicine application and verified that I am speaking with the correct person using two identifiers.   Location: Patient: Home Provider: Home Office   I discussed the limitations of evaluation and management by telemedicine and the availability of in person appointments. The patient expressed understanding and agreed to proceed.     01/06/2023 Susan Guerra [id number]  Chief Complaint:  Chief Complaint  Patient presents with   Depression   Trauma   Visit Diagnosis: Depression, PTSD    CCA Screening, Triage and Referral (STR)  Patient Reported Information How did you hear about [place]? No data recorded Referral name: No data recorded Referral phone number: No data recorded  Whom do you see for routine medical problems? No data recorded Practice/Facility Name: No data recorded Practice/Facility Phone Number: No data recorded Name of Contact: No data recorded Contact Number: No data recorded Contact Fax Number: No data recorded Prescriber Name: No data recorded Prescriber Address (if known): No data recorded  What Is the Reason for Your Visit/Call Today? No data recorded How Long Has This Been Causing You Problems? No data recorded What Do You Feel Would Help You the Most Today? No data recorded  Have You Recently Been in Any Inpatient Treatment (Hospital/Detox/Crisis Center/28-Day Program)? No data recorded Name/Location of Program/Hospital:No data recorded How Long Were You There? No data recorded When Were You Discharged? No data recorded  Have You Ever Received Services From [facility] Before? No data recorded Who Do You See at [facility]? No data recorded  Have You Recently Had Any Thoughts About Hurting Yourself? No data recorded Are You Planning to Commit Suicide/Harm Yourself At This time? No data  recorded  Have you Recently Had Thoughts About Hurting Someone Karolee Ohs? No data recorded Explanation: No data recorded  Have You Used Any Alcohol or Drugs in the Past 24 Hours? No data recorded How Long Ago Did You Use Drugs or Alcohol? No data recorded What Did You Use and How Much? No data recorded  Do You Currently Have a Therapist/Psychiatrist? No data recorded Name of Therapist/Psychiatrist: No data recorded  Have You Been Recently Discharged From Any Office Practice or Programs? No data recorded Explanation of Discharge From Practice/Program: No data recorded    CCA Screening Triage Referral Assessment Type of Contact: No data recorded Is this Initial or Reassessment? No data recorded Date Telepsych consult ordered in [facility]:  No data recorded Time Telepsych consult ordered in [facility]:  No data recorded  Patient Reported Information Reviewed? No data recorded Patient Left Without Being Seen? No data recorded Reason for Not Completing Assessment: No data recorded  Collateral Involvement: No data recorded  Does Patient Have a Court Appointed Legal Guardian? No data recorded Name and Contact of Legal Guardian: No data recorded If Minor and Not Living with Parent(s), Who has Custody? No data recorded Is CPS involved or ever been involved? No data recorded Is APS involved or ever been involved? No data recorded  Patient Determined To Be At Risk for Harm To Self or Others Based on Review of Patient Reported Information or Presenting Complaint? No data recorded Method: No data recorded Availability of Means: No data recorded Intent: No data recorded Notification Required: No data recorded Additional Information for Danger to Others Potential: No data recorded Additional Comments for Danger to Others Potential: No data recorded Are  There Guns or Other Weapons in Your Home? No data recorded Types of Guns/Weapons: No data recorded Are These Weapons Safely Secured?                             No data recorded Who Could Verify You Are Able To Have These Secured: No data recorded Do You Have any Outstanding Charges, Pending Court Dates, Parole/Probation? No data recorded Contacted To Inform of Risk of Harm To Self or Others: No data recorded  Location of Assessment: No data recorded  Does Patient Present under Involuntary Commitment? No data recorded IVC Papers Initial File Date: No data recorded  [place] of Residence: No data recorded  Patient Currently Receiving the Following Services: No data recorded  Determination of Need: No data recorded  Options For Referral: No data recorded    CCA Biopsychosocial Intake/Chief Complaint:  Pt is referred to therapy for PTSD (MST) and MDD by the [facility]. PTSD from MST in the military Her stressors: her 18 yo son who has explosive aspbergers, her long-term relationship  She has previous therapy. Pt just completed TMS at [facility], mutliple deaths, multiple health issues  Current Symptoms/Problems: depressive, anxious, trauma related symptoms   Patient Reported Schizophrenia/Schizoaffective Diagnosis in Past: No data recorded  Strengths: family support and previous therapy  Preferences: outpatient services  Abilities: No data recorded  Type of Services Patient Feels are Needed: outpatient therapy   Initial Clinical Notes/Concerns: No data recorded  Mental Health Symptoms Depression:   Change in energy/activity; Difficulty Concentrating; Fatigue; Hopelessness; Worthlessness; Irritability; Sleep (too much or little); Increase/decrease in appetite; Tearfulness   Duration of Depressive symptoms:  Greater than two weeks   Mania:   None   Anxiety:    Difficulty concentrating; Fatigue; Irritability; Restlessness; Sleep; Tension; Worrying   Psychosis:   None   Duration of Psychotic symptoms: No data recorded  Trauma:   Avoids reminders of event; Detachment from others; Difficulty staying/falling asleep; Emotional  numbing; Guilt/shame; Hypervigilance; Irritability/anger; Re-experience of traumatic event   Obsessions:   Cause anxiety; Intrusive/time consuming; Recurrent & persistent thoughts/impulses/images; Disrupts routine/functioning   Compulsions:   Disrupts with routine/functioning; "Driven" to perform behaviors/acts; Intrusive/time consuming; Repeated behaviors/mental acts   Inattention:   None   Hyperactivity/Impulsivity:   N/A   Oppositional/Defiant Behaviors:   None   Emotional Irregularity:  No data recorded  Other Mood/Personality Symptoms:  No data recorded   Mental Status Exam Appearance and self-care  Stature:   Average   Weight:   Average weight   Clothing:   Casual   Grooming:   Normal   Cosmetic use:   None   Posture/gait:   Normal   Motor activity:   Restless   Sensorium  Attention:   Normal   Concentration:   Anxiety interferes   Orientation:   X5   Recall/memory:   Defective in Short-term; Defective in Remote   Affect and Mood  Affect:   Depressed   Mood:   Depressed   Relating  Eye contact:   Normal   Facial expression:   Depressed   Attitude toward examiner:   Cooperative   Thought and Language  Speech flow:  Clear and Coherent   Thought content:   Appropriate to Mood and Circumstances   Preoccupation:   Ruminations   Hallucinations:   Visual   Organization:  No data recorded  [employer] of Knowledge:   Impoverished by (Comment)  Intelligence:   Average   Abstraction:   Normal   Judgement:   Fair   Dance movement psychotherapist:   Realistic   Insight:   Fair   Decision Making:   Impulsive   Social Functioning  Social Maturity:   Isolates   Social Judgement:   Normal   Stress  Stressors:   Family conflict; Grief/losses; Relationship; Illness; Transitions   Coping Ability:   Normal   Skill Deficits:  No data recorded  Supports:   Family     Religion: Religion/Spirituality Are  You A Religious Person?: Yes What is Your Religious Affiliation?: Pentecostal  Leisure/Recreation: Leisure / Recreation Do You Have Hobbies?: Yes Leisure and Hobbies: singing, seeing friends  Exercise/Diet: Exercise/Diet Do You Exercise?: No Have You Gained or Lost A Significant Amount of Weight in the Past Six Months?: No Do You Follow a Special Diet?: No Do You Have Any Trouble Sleeping?: Yes Explanation of Sleeping Difficulties: insomnia   CCA Employment/Education Employment/Work Situation:    Education:     CCA Family/Childhood History Family and Relationship History:    Childhood History:     Child/Adolescent Assessment:     CCA Substance Use Alcohol/Drug Use: Alcohol / Drug Use History of alcohol / drug use?: No history of alcohol / drug abuse                         ASAM's:  Six Dimensions of Multidimensional Assessment  Dimension 1:  Acute Intoxication and/or Withdrawal Potential:      Dimension 2:  Biomedical Conditions and Complications:      Dimension 3:  Emotional, Behavioral, or Cognitive Conditions and Complications:     Dimension 4:  Readiness to Change:     Dimension 5:  Relapse, Continued use, or Continued Problem Potential:     Dimension 6:  Recovery/Living Environment:     ASAM Severity Score:    ASAM Recommended Level of Treatment:     Substance use Disorder (SUD)    Recommendations for Services/Supports/Treatments: Recommendations for Services/Supports/Treatments Recommendations For Services/Supports/Treatments: Individual Therapy  DSM5 Diagnoses: Patient Active Problem List   Diagnosis Date Noted   PTSD (post-traumatic stress disorder) 12/10/2020   MDD (major depressive disorder) 12/10/2020   Atypical migraine 03/04/2020    Patient Centered Plan: Patient is on the following Treatment Plan(s):  Depression and Post Traumatic Stress Disorder   Referrals to Alternative Service(s): Referred to Alternative  Service(s):   Place:   Date:   Time:    Referred to Alternative Service(s):   Place:   Date:   Time:    Referred to Alternative Service(s):   Place:   Date:   Time:    Referred to Alternative Service(s):   Place:   Date:   Time:      Collaboration of Care: Medication Management [facility]  Patient/Guardian was advised Release of Information must be obtained prior to any record release in order to collaborate their care with an outside provider. Patient/Guardian was advised if they have not already done so to contact the registration department to sign all necessary forms in order for [place] to release information regarding their care.   Consent: Patient/Guardian gives verbal consent for treatment and assignment of benefits for services provided during this visit. Patient/Guardian expressed understanding and agreed to proceed.   Larnell Granlund S, LCAS 01/06/23

## 2023-01-11 ENCOUNTER — Encounter (HOSPITAL_COMMUNITY): Payer: Self-pay | Admitting: Licensed Clinical Social Worker

## 2023-01-11 NOTE — Progress Notes (Signed)
Virtual Visit via video Note   I connected with Susan Guerra on 12/23/22 at 1:00-2:00 pm by video enabled telemedicine application and verified that I am speaking with the correct person using two identifiers.   Location: Patient: home Provider: home office   I discussed the limitations of evaluation and management by telemedicine and the availability of in person appointments. The patient expressed understanding and agreed to proceed.   History of Present Illness: Pt is referred to therapy for PTSD (MST) and MDD by the [facility]. Her biggest stressors are her health, taking care of her children (15 & 6) transitioning out of her 12 year relationship. Pt has fibromyalgia and other health issues, which affects her ability to take care of all her family's needs.  She has previous therapy.  Treatment Goals Addressed: Pt will recall the traumatic event without becoming too overwhelmed with emotions 50% of the time, evidenced by self report. Meet with clinician in person weekly for therapy to monitor for progress towards goals and address any barriers to success; Reduce depression from average severity level of 6/10 down to a 4/10 in next 90 days by engaging in 1-2 positive coping skills daily as part of developing self-care routine; Reduce average anxiety level from 7/10 down to 5/10 in next 90 days by utilizing 1-2 relaxation skills/grounding skills per day, such as mindful breathing, progressive muscle relaxation, positive visualizations.  Progression towards Goal: Progressing     Observations/Objective: Patient presented for today's session on time and was alert, oriented x5, with no evidence or self-report of SI/HI or A/V H. Patient reported ongoing compliance with medication and denied any use of alcohol or illicit substances.  Clinician inquired about patient's current emotional ratings, as well as any significant changes in thoughts, feelings or behavior since previous session. Patient's  emotional ratings: 5/10 for depression, 5/10 for anxiety, 2/10 for anger/irritability, recalling traumatic event without becoming too overwhelmed with emotions 50% of the time. All ratings are evidenced by self report. Cln explored trauma emotional ratings with pt who identified her trauma triggers and coping skills used. Pt provided updates on health, family, x relationship, new relationship, job.  Cln explored with pt negative cycle of anxiety including ruminative  thoughts and avoidant behaviors and discussed negative (longer-term)  consequences of avoidance. Pt practiced confronting avoidance w/problem solving  skill (DBT Emotion Regulation skill).    Collaboration of Care: Other: Continue with providers at [facility]  Patient/Guardian was advised Release of Information must be obtained prior to any record release in order to collaborate their care with an outside provider.   Patient/Guardian was advised if they have not already done so to contact the registration department to sign all necessary forms in order for [place] to release information regarding their care.   Consent: Patient/Guardian gives verbal consent for treatment and assignment of benefits for services provided during this visit. Patient/Guardian expressed understanding and agreed to proceed.  Assessment and Plan: Counselor will continue to meet with patient to address treatment plan goals. Patient will continue to follow recommendations of providers and implement skills learned in session.        Follow Up Instructions: I discussed the assessment and treatment plan with the patient. The patient was provided an opportunity to ask questions and all were answered. The patient agreed with the plan and demonstrated an understanding of the plan.   The patient was advised to call back or seek an in-person evaluation if the symptoms worsen or if the condition fails to  improve as anticipated.   I provided 60 minutes of non-face-to-face time  during this encounter.     Antonya Leeder S, LCAS

## 2023-01-12 ENCOUNTER — Ambulatory Visit (HOSPITAL_COMMUNITY): Payer: No Typology Code available for payment source | Admitting: Licensed Clinical Social Worker

## 2023-01-12 ENCOUNTER — Ambulatory Visit (HOSPITAL_COMMUNITY): Payer: Medicaid Other | Admitting: Licensed Clinical Social Worker

## 2023-01-13 ENCOUNTER — Encounter (HOSPITAL_COMMUNITY): Payer: Self-pay | Admitting: Licensed Clinical Social Worker

## 2023-01-13 ENCOUNTER — Ambulatory Visit (HOSPITAL_COMMUNITY): Payer: No Typology Code available for payment source | Admitting: Licensed Clinical Social Worker

## 2023-01-13 NOTE — Progress Notes (Signed)
Virtual Visit via video Note   I connected with Susan Guerra on 12/30/22 at 2:00-3:00 pm by video enabled telemedicine application and verified that I am speaking with the correct person using two identifiers.   Location: Patient: home Provider: home office   I discussed the limitations of evaluation and management by telemedicine and the availability of in person appointments. The patient expressed understanding and agreed to proceed.   History of Present Illness: Pt is referred to therapy for PTSD (MST) and MDD by the [facility]. Her biggest stressors are her health, taking care of her children (15 & 6) transitioning out of her 12 year relationship. Pt has fibromyalgia and other health issues, which affects her ability to take care of all her family's needs.  She has previous therapy.  Treatment Goals Addressed: Pt will recall the traumatic event without becoming too overwhelmed with emotions 50% of the time, evidenced by self report. Meet with clinician in person weekly for therapy to monitor for progress towards goals and address any barriers to success; Reduce depression from average severity level of 6/10 down to a 4/10 in next 90 days by engaging in 1-2 positive coping skills daily as part of developing self-care routine; Reduce average anxiety level from 7/10 down to 5/10 in next 90 days by utilizing 1-2 relaxation skills/grounding skills per day, such as mindful breathing, progressive muscle relaxation, positive visualizations.  Progression towards Goal: Progressing     Observations/Objective: Patient presented for today's session on time and was alert, oriented x5, with no evidence or self-report of SI/HI or A/V H. Patient reported ongoing compliance with medication and denied any use of alcohol or illicit substances.  Clinician inquired about patient's current emotional ratings, as well as any significant changes in thoughts, feelings or behavior since previous session. Patient's  emotional ratings: 5/10 for depression, 5/10 for anxiety, 2/10 for anger/irritability, recalling traumatic event without becoming too overwhelmed with emotions 50% of the time. All ratings are evidenced by self report. Cln explored trauma emotional ratings with pt who identified her trauma triggers and coping skills used. Pt provided updates on health, family, x relationship, new relationship, job. Clinician engaged in discussion on identified positives and utilized MI, OARS to assess how these have impacted client's mood. Clinician allowed space for further processing of emotions and provided supportive statements throughout session.      Collaboration of Care: Other: Continue with providers at [facility]  Patient/Guardian was advised Release of Information must be obtained prior to any record release in order to collaborate their care with an outside provider.   Patient/Guardian was advised if they have not already done so to contact the registration department to sign all necessary forms in order for [place] to release information regarding their care.   Consent: Patient/Guardian gives verbal consent for treatment and assignment of benefits for services provided during this visit. Patient/Guardian expressed understanding and agreed to proceed.  Assessment and Plan: Counselor will continue to meet with patient to address treatment plan goals. Patient will continue to follow recommendations of providers and implement skills learned in session.        Follow Up Instructions: I discussed the assessment and treatment plan with the patient. The patient was provided an opportunity to ask questions and all were answered. The patient agreed with the plan and demonstrated an understanding of the plan.   The patient was advised to call back or seek an in-person evaluation if the symptoms worsen or if the condition fails to improve  as anticipated.   I provided 60 minutes of non-face-to-face time during this  encounter.     Gaius Ishaq S, LCAS

## 2023-01-19 ENCOUNTER — Ambulatory Visit (HOSPITAL_COMMUNITY): Payer: Medicaid Other | Admitting: Licensed Clinical Social Worker

## 2023-01-19 ENCOUNTER — Ambulatory Visit (HOSPITAL_COMMUNITY): Payer: No Typology Code available for payment source | Admitting: Licensed Clinical Social Worker

## 2023-01-20 ENCOUNTER — Ambulatory Visit (INDEPENDENT_AMBULATORY_CARE_PROVIDER_SITE_OTHER): Payer: No Typology Code available for payment source | Admitting: Licensed Clinical Social Worker

## 2023-01-20 DIAGNOSIS — F331 Major depressive disorder, recurrent, moderate: Secondary | ICD-10-CM

## 2023-01-20 DIAGNOSIS — F4310 Post-traumatic stress disorder, unspecified: Secondary | ICD-10-CM | POA: Diagnosis not present

## 2023-01-22 ENCOUNTER — Encounter (HOSPITAL_COMMUNITY): Payer: Self-pay | Admitting: Licensed Clinical Social Worker

## 2023-01-22 NOTE — Progress Notes (Signed)
Virtual Visit via video Note   I connected with Susan Guerra on 01/22/23 at 1-1:45 pm by video enabled telemedicine application and verified that I am speaking with the correct person using two identifiers.   Location: Patient: home Provider: home office   I discussed the limitations of evaluation and management by telemedicine and the availability of in person appointments. The patient expressed understanding and agreed to proceed.   History of Present Illness: Pt is referred to therapy for PTSD (MST) and MDD by the [facility]. Her biggest stressors are her health, taking care of her children (15 & 6) transitioning out of her 12 year relationship. Pt has fibromyalgia and other health issues, which affects her ability to take care of all her family's needs.  She has previous therapy.  Treatment Goals Addressed: Pt will recall the traumatic event without becoming too overwhelmed with emotions 50% of the time, evidenced by self report. Meet with clinician in person weekly for therapy to monitor for progress towards goals and address any barriers to success; Reduce depression from average severity level of 6/10 down to a 4/10 in next 90 days by engaging in 1-2 positive coping skills daily as part of developing self-care routine; Reduce average anxiety level from 7/10 down to 5/10 in next 90 days by utilizing 1-2 relaxation skills/grounding skills per day, such as mindful breathing, progressive muscle relaxation, positive visualizations.  Progression towards Goal: Progressing     Observations/Objective: Patient presented for today's session on time and was alert, oriented x5, with no evidence or self-report of SI/HI or A/V H. Patient reported ongoing compliance with medication and denied any use of alcohol or illicit substances.  Clinician inquired about patient's current emotional ratings, as well as any significant changes in thoughts, feelings or behavior since previous session. Patient's  emotional ratings: 7/10 for depression, 5/10 for anxiety, 2/10 for anger/irritability, recalling traumatic event without becoming too overwhelmed with emotions 50% of the time. All ratings are evidenced by self report. Cln explored trauma emotional ratings with pt who identified her trauma triggers and coping skills used. Pt provided updates on health, family, x relationship, new relationship, job. Pt reports, "I'm going to move back to [place] to live with my sister for a while to have help, get my health restored, get financially stable. Pt was tearful in her description. Clinician utilized CBT to address her thought processes support and confidence in her decisions.    Collaboration of Care: Other: Continue with providers at [facility]  Patient/Guardian was advised Release of Information must be obtained prior to any record release in order to collaborate their care with an outside provider.   Patient/Guardian was advised if they have not already done so to contact the registration department to sign all necessary forms in order for [place] to release information regarding their care.   Consent: Patient/Guardian gives verbal consent for treatment and assignment of benefits for services provided during this visit. Patient/Guardian expressed understanding and agreed to proceed.  Assessment and Plan: Counselor will continue to meet with patient to address treatment plan goals. Patient will continue to follow recommendations of providers and implement skills learned in session.        Follow Up Instructions: I discussed the assessment and treatment plan with the patient. The patient was provided an opportunity to ask questions and all were answered. The patient agreed with the plan and demonstrated an understanding of the plan.   The patient was advised to call back or seek an in-person  evaluation if the symptoms worsen or if the condition fails to improve as anticipated.   I provided 45 minutes of  non-face-to-face time during this encounter.     Andretta Ergle S, LCAS

## 2023-01-26 ENCOUNTER — Ambulatory Visit (HOSPITAL_COMMUNITY): Payer: No Typology Code available for payment source | Admitting: Licensed Clinical Social Worker

## 2023-01-26 ENCOUNTER — Encounter (HOSPITAL_COMMUNITY): Payer: Self-pay | Admitting: Licensed Clinical Social Worker

## 2023-01-27 ENCOUNTER — Encounter (HOSPITAL_COMMUNITY): Payer: Self-pay | Admitting: Licensed Clinical Social Worker

## 2023-01-27 ENCOUNTER — Ambulatory Visit (HOSPITAL_COMMUNITY): Payer: No Typology Code available for payment source | Admitting: Licensed Clinical Social Worker

## 2023-01-27 DIAGNOSIS — F4310 Post-traumatic stress disorder, unspecified: Secondary | ICD-10-CM | POA: Diagnosis not present

## 2023-01-27 DIAGNOSIS — F331 Major depressive disorder, recurrent, moderate: Secondary | ICD-10-CM | POA: Diagnosis not present

## 2023-01-27 NOTE — Progress Notes (Signed)
Virtual Visit via video Note   I connected with Susan Guerra on 01/22/23 at 1-1:45 pm by video enabled telemedicine application and verified that I am speaking with the correct person using two identifiers.   Location: Patient: home Provider: home office   I discussed the limitations of evaluation and management by telemedicine and the availability of in person appointments. The patient expressed understanding and agreed to proceed.   History of Present Illness: Pt is referred to therapy for PTSD (MST) and MDD by the [facility]. Her biggest stressors are her health, taking care of her children (15 & 6) transitioning out of her 12 year relationship. Pt has fibromyalgia and other health issues, which affects her ability to take care of all her family's needs.  She has previous therapy.  Treatment Goals Addressed: Pt will recall the traumatic event without becoming too overwhelmed with emotions 50% of the time, evidenced by self report. Meet with clinician in person weekly for therapy to monitor for progress towards goals and address any barriers to success; Reduce depression from average severity level of 6/10 down to a 4/10 in next 90 days by engaging in 1-2 positive coping skills daily as part of developing self-care routine; Reduce average anxiety level from 7/10 down to 5/10 in next 90 days by utilizing 1-2 relaxation skills/grounding skills per day, such as mindful breathing, progressive muscle relaxation, positive visualizations.  Progression towards Goal: Progressing     Observations/Objective: Patient presented for today's session on time and was alert, oriented x5, with no evidence or self-report of SI/HI or A/V H. Patient reported ongoing compliance with medication and denied any use of alcohol or illicit substances.  Clinician inquired about patient's current emotional ratings, as well as any significant changes in thoughts, feelings or behavior since previous session. Patient's  emotional ratings: 7/10 for depression, 5/10 for anxiety, 2/10 for anger/irritability, recalling traumatic event without becoming too overwhelmed with emotions 50% of the time. All ratings are evidenced by self report. Cln explored trauma emotional ratings with pt who identified her trauma triggers and coping skills used. Pt provided updates on health, family, x relationship, new relationship, job. Pt reports, "I'm going to move back to [place] to live with my sister for a while to have help, get my health restored, get financially stable. Pt was tearful in her description. Clinician utilized CBT to address her thought processes support and confidence in her decisions.    Collaboration of Care: Other: Continue with providers at [facility]  Patient/Guardian was advised Release of Information must be obtained prior to any record release in order to collaborate their care with an outside provider.   Patient/Guardian was advised if they have not already done so to contact the registration department to sign all necessary forms in order for [place] to release information regarding their care.   Consent: Patient/Guardian gives verbal consent for treatment and assignment of benefits for services provided during this visit. Patient/Guardian expressed understanding and agreed to proceed.  Assessment and Plan: Counselor will continue to meet with patient to address treatment plan goals. Patient will continue to follow recommendations of providers and implement skills learned in session.        Follow Up Instructions: I discussed the assessment and treatment plan with the patient. The patient was provided an opportunity to ask questions and all were answered. The patient agreed with the plan and demonstrated an understanding of the plan.   The patient was advised to call back or seek an in-person  evaluation if the symptoms worsen or if the condition fails to improve as anticipated.   I provided 45 minutes of  non-face-to-face time during this encounter.     Aulden Calise S, LCAS

## 2023-02-02 ENCOUNTER — Encounter (HOSPITAL_COMMUNITY): Payer: Self-pay | Admitting: Licensed Clinical Social Worker

## 2023-02-02 ENCOUNTER — Ambulatory Visit (HOSPITAL_COMMUNITY): Payer: No Typology Code available for payment source | Admitting: Licensed Clinical Social Worker

## 2023-02-02 DIAGNOSIS — F4310 Post-traumatic stress disorder, unspecified: Secondary | ICD-10-CM

## 2023-02-02 DIAGNOSIS — F331 Major depressive disorder, recurrent, moderate: Secondary | ICD-10-CM | POA: Diagnosis not present

## 2023-02-02 NOTE — Progress Notes (Signed)
Virtual Visit via video Note   I connected with Susan Guerra on 02/02/23 at 5:00 pm during group session by video enabled telemedicine application and verified that I am speaking with the correct person using two identifiers.   Location: Patient: home Provider: home office   I discussed the limitations of evaluation and management by telemedicine and the availability of in person appointments. The patient expressed understanding and agreed to proceed.   History of Present Illness: Pt is referred to therapy for PTSD (MST) and MDD by the [facility]. Her biggest stressors are her health, taking care of her children (15 & 6) transitioning out of her 12 year relationship. Pt has fibromyalgia and other health issues, which affects her ability to take care of all her family's needs.  She has previous therapy.  Treatment Goals Addressed: Pt will recall the traumatic event without becoming too overwhelmed with emotions 50% of the time, evidenced by self report. Meet with clinician in person weekly for therapy to monitor for progress towards goals and address any barriers to success; Reduce depression from average severity level of 6/10 down to a 4/10 in next 90 days by engaging in 1-2 positive coping skills daily as part of developing self-care routine; Reduce average anxiety level from 7/10 down to 5/10 in next 90 days by utilizing 1-2 relaxation skills/grounding skills per day, such as mindful breathing, progressive muscle relaxation, positive visualizations.  Progression towards Goal: Progressing     Observations/Objective:   Patient participated in a group discussion on upcoming Father's Day. It will be bittersweet for some, having lost fathers, not hearing from children, and remembering past Father's Days. Cln encouraged pt to remember the past bittersweet memories. "It's the first Father's Day since my father's death. It was difficult."    Collaboration of Care: Other: None at this  session    Patient/Guardian was advised Release of Information must be obtained prior to any record release in order to collaborate their care with an outside provider.   Patient/Guardian was advised if they have not already done so to contact the registration department to sign all necessary forms in order for [place] to release information regarding their care.   Consent: Patient/Guardian gives verbal consent for treatment and assignment of benefits for services provided during this visit. Patient/Guardian expressed understanding and agreed to proceed.  Assessment and Plan: Counselor will continue to meet with patient to address treatment plan goals. Patient will continue to follow recommendations of providers and implement skills learned in session.        Follow Up Instructions: I discussed the assessment and treatment plan with the patient. The patient was provided an opportunity to ask questions and all were answered. The patient agreed with the plan and demonstrated an understanding of the plan.   The patient was advised to call back or seek an in-person evaluation if the symptoms worsen or if the condition fails to improve as anticipated.   I provided 75 minutes of non-face-to-face time during the group encounter.     Hilja Kintzel S, LCAS

## 2023-02-03 ENCOUNTER — Ambulatory Visit (HOSPITAL_COMMUNITY): Payer: No Typology Code available for payment source | Admitting: Licensed Clinical Social Worker

## 2023-02-03 DIAGNOSIS — F331 Major depressive disorder, recurrent, moderate: Secondary | ICD-10-CM

## 2023-02-03 DIAGNOSIS — F4310 Post-traumatic stress disorder, unspecified: Secondary | ICD-10-CM

## 2023-02-09 ENCOUNTER — Ambulatory Visit (HOSPITAL_COMMUNITY): Payer: No Typology Code available for payment source | Admitting: Licensed Clinical Social Worker

## 2023-02-09 ENCOUNTER — Encounter (HOSPITAL_COMMUNITY): Payer: Self-pay | Admitting: Licensed Clinical Social Worker

## 2023-02-09 DIAGNOSIS — F331 Major depressive disorder, recurrent, moderate: Secondary | ICD-10-CM | POA: Diagnosis not present

## 2023-02-09 DIAGNOSIS — F4310 Post-traumatic stress disorder, unspecified: Secondary | ICD-10-CM | POA: Diagnosis not present

## 2023-02-09 NOTE — Progress Notes (Signed)
Virtual GroupVisit via video Note   I connected with Susan Guerra on 02/09/23 at 5:00 pm during group session by video enabled telemedicine application and verified that I am speaking with the correct person using two identifiers.   Location: Patient: home Provider: home office   I discussed the limitations of evaluation and management by telemedicine and the availability of in person appointments. The patient expressed understanding and agreed to proceed.   History of Present Illness: Pt is referred to therapy for PTSD (MST) and MDD by the [facility]. Her biggest stressors are her health, taking care of her children (15 & 6) transitioning out of her 12 year relationship. Pt has fibromyalgia and other health issues, which affects her ability to take care of all her family's needs.  She has previous therapy.  Treatment Goals Addressed: Pt will recall the traumatic event without becoming too overwhelmed with emotions 50% of the time, evidenced by self report. Meet with clinician in person weekly for therapy to monitor for progress towards goals and address any barriers to success; Reduce depression from average severity level of 6/10 down to a 4/10 in next 90 days by engaging in 1-2 positive coping skills daily as part of developing self-care routine; Reduce average anxiety level from 7/10 down to 5/10 in next 90 days by utilizing 1-2 relaxation skills/grounding skills per day, such as mindful breathing, progressive muscle relaxation, positive visualizations.  Progression towards Goal: Progressing     Observations/Objective:   Patient participated in a group discussion on believing in yourself, having faith in your own capabilities. When you believe in yourself, you can overcome self-doubt and have the confidence to take action and get things done.  Patient was encouraged to continue believing in hierself.  Collaboration of Care: Other: None at this session    Patient/Guardian was  advised Release of Information must be obtained prior to any record release in order to collaborate their care with an outside provider.   Patient/Guardian was advised if they have not already done so to contact the registration department to sign all necessary forms in order for [place] to release information regarding their care.   Consent: Patient/Guardian gives verbal consent for treatment and assignment of benefits for services provided during this visit. Patient/Guardian expressed understanding and agreed to proceed.  Assessment and Plan: Counselor will continue to meet with patient to address treatment plan goals. Patient will continue to follow recommendations of providers and implement skills learned in session.        Follow Up Instructions: I discussed the assessment and treatment plan with the patient. The patient was provided an opportunity to ask questions and all were answered. The patient agreed with the plan and demonstrated an understanding of the plan.   The patient was advised to call back or seek an in-person evaluation if the symptoms worsen or if the condition fails to improve as anticipated.   I provided 90 minutes of non-face-to-face time during the group encounter.     Jaterrius Ricketson S, LCAS

## 2023-02-10 ENCOUNTER — Ambulatory Visit (HOSPITAL_COMMUNITY): Payer: No Typology Code available for payment source | Admitting: Licensed Clinical Social Worker

## 2023-02-10 DIAGNOSIS — F331 Major depressive disorder, recurrent, moderate: Secondary | ICD-10-CM | POA: Diagnosis not present

## 2023-02-10 DIAGNOSIS — F4310 Post-traumatic stress disorder, unspecified: Secondary | ICD-10-CM

## 2023-02-16 ENCOUNTER — Encounter (HOSPITAL_COMMUNITY): Payer: Self-pay | Admitting: Licensed Clinical Social Worker

## 2023-02-16 ENCOUNTER — Ambulatory Visit (HOSPITAL_COMMUNITY): Payer: No Typology Code available for payment source | Admitting: Licensed Clinical Social Worker

## 2023-02-16 DIAGNOSIS — F4310 Post-traumatic stress disorder, unspecified: Secondary | ICD-10-CM | POA: Diagnosis not present

## 2023-02-16 DIAGNOSIS — F331 Major depressive disorder, recurrent, moderate: Secondary | ICD-10-CM

## 2023-02-16 NOTE — Progress Notes (Signed)
Virtual GroupVisit via video Note   I connected with Susan Guerra on 02/09/23 at 5:00 pm during group session by video enabled telemedicine application and verified that I am speaking with the correct person using two identifiers.   Location: Patient: home Provider: home office   I discussed the limitations of evaluation and management by telemedicine and the availability of in person appointments. The patient expressed understanding and agreed to proceed.   History of Present Illness: Pt is referred to therapy for PTSD (MST) and MDD by the [facility]. Her biggest stressors are her health, taking care of her children (15 & 6) transitioning out of her 12 year relationship. Pt has fibromyalgia and other health issues, which affects her ability to take care of all her family's needs.  She has previous therapy.  Treatment Goals Addressed: Pt will recall the traumatic event without becoming too overwhelmed with emotions 50% of the time, evidenced by self report. Meet with clinician in person weekly for therapy to monitor for progress towards goals and address any barriers to success; Reduce depression from average severity level of 6/10 down to a 4/10 in next 90 days by engaging in 1-2 positive coping skills daily as part of developing self-care routine; Reduce average anxiety level from 7/10 down to 5/10 in next 90 days by utilizing 1-2 relaxation skills/grounding skills per day, such as mindful breathing, progressive muscle relaxation, positive visualizations.  Progression towards Goal: Progressing     Observations/Objective: Patient participated in a discussion on the stressors of fireworks during the July 4th celebrations. Patient discussed how the fireworks have been going off in HER neighborhood for a few weeks and how it has affected HeR mental health and PTSD symptoms. Patient was encouraged to use HER identified coping skills to assist in the fireworks celebration.         Collaboration of Care: Other: None at this session    Patient/Guardian was advised Release of Information must be obtained prior to any record release in order to collaborate their care with an outside provider.   Patient/Guardian was advised if they have not already done so to contact the registration department to sign all necessary forms in order for [place] to release information regarding their care.   Consent: Patient/Guardian gives verbal consent for treatment and assignment of benefits for services provided during this visit. Patient/Guardian expressed understanding and agreed to proceed.  Assessment and Plan: Counselor will continue to meet with patient to address treatment plan goals. Patient will continue to follow recommendations of providers and implement skills learned in session.        Follow Up Instructions: I discussed the assessment and treatment plan with the patient. The patient was provided an opportunity to ask questions and all were answered. The patient agreed with the plan and demonstrated an understanding of the plan.   The patient was advised to call back or seek an in-person evaluation if the symptoms worsen or if the condition fails to improve as anticipated.   I provided 90 minutes of non-face-to-face time during the group encounter.     Susan Guerra S, LCAS

## 2023-02-17 ENCOUNTER — Ambulatory Visit (HOSPITAL_COMMUNITY): Payer: No Typology Code available for payment source | Admitting: Licensed Clinical Social Worker

## 2023-02-17 DIAGNOSIS — F4310 Post-traumatic stress disorder, unspecified: Secondary | ICD-10-CM | POA: Diagnosis not present

## 2023-02-17 DIAGNOSIS — F331 Major depressive disorder, recurrent, moderate: Secondary | ICD-10-CM

## 2023-02-23 ENCOUNTER — Ambulatory Visit (HOSPITAL_COMMUNITY): Payer: No Typology Code available for payment source | Admitting: Licensed Clinical Social Worker

## 2023-02-23 DIAGNOSIS — F331 Major depressive disorder, recurrent, moderate: Secondary | ICD-10-CM

## 2023-02-23 DIAGNOSIS — F4310 Post-traumatic stress disorder, unspecified: Secondary | ICD-10-CM

## 2023-02-24 ENCOUNTER — Ambulatory Visit (INDEPENDENT_AMBULATORY_CARE_PROVIDER_SITE_OTHER): Payer: No Typology Code available for payment source | Admitting: Licensed Clinical Social Worker

## 2023-02-24 DIAGNOSIS — F4310 Post-traumatic stress disorder, unspecified: Secondary | ICD-10-CM

## 2023-02-24 DIAGNOSIS — F331 Major depressive disorder, recurrent, moderate: Secondary | ICD-10-CM

## 2023-03-02 ENCOUNTER — Ambulatory Visit (HOSPITAL_COMMUNITY): Payer: No Typology Code available for payment source | Admitting: Licensed Clinical Social Worker

## 2023-03-02 ENCOUNTER — Encounter (HOSPITAL_COMMUNITY): Payer: Self-pay | Admitting: Licensed Clinical Social Worker

## 2023-03-02 DIAGNOSIS — F331 Major depressive disorder, recurrent, moderate: Secondary | ICD-10-CM

## 2023-03-02 DIAGNOSIS — F4310 Post-traumatic stress disorder, unspecified: Secondary | ICD-10-CM

## 2023-03-02 NOTE — Progress Notes (Signed)
Virtual GroupVisit via video Note   I connected with Susan Guerra on 03/02/23/24 at 5:00 pm during group session by video enabled telemedicine application and verified that I am speaking with the correct person using two identifiers.   Location: Patient: home Provider: home office   I discussed the limitations of evaluation and management by telemedicine and the availability of in person appointments. The patient expressed understanding and agreed to proceed.   History of Present Illness: Pt is referred to therapy for PTSD (MST) and MDD by the [facility]. Her biggest stressors are her health, taking care of her children (15 & 6) transitioning out of her 12 year relationship. Pt has fibromyalgia and other health issues, which affects her ability to take care of all her family's needs.  She has previous therapy.  Treatment Goals Addressed: Pt will recall the traumatic event without becoming too overwhelmed with emotions 50% of the time, evidenced by self report. Meet with clinician in person weekly for therapy to monitor for progress towards goals and address any barriers to success; Reduce depression from average severity level of 6/10 down to a 4/10 in next 90 days by engaging in 1-2 positive coping skills daily as part of developing self-care routine; Reduce average anxiety level from 7/10 down to 5/10 in next 90 days by utilizing 1-2 relaxation skills/grounding skills per day, such as mindful breathing, progressive muscle relaxation, positive visualizations.  Progression towards Goal: Progressing     Observations/Objective: Pt participated in a group discussion on change, which may mean letting go of dysfunctional relationship patterns, irrational beliefs and self-sabotaging behaviors and then replacing them with more positive beliefs and relationship patterns. Patient was encouraged to embrace change.       Collaboration of Care: Other: None at this session    Patient/Guardian  was advised Release of Information must be obtained prior to any record release in order to collaborate their care with an outside provider.   Patient/Guardian was advised if they have not already done so to contact the registration department to sign all necessary forms in order for [place] to release information regarding their care.   Consent: Patient/Guardian gives verbal consent for treatment and assignment of benefits for services provided during this visit. Patient/Guardian expressed understanding and agreed to proceed.  Assessment and Plan: Counselor will continue to meet with patient to address treatment plan goals. Patient will continue to follow recommendations of providers and implement skills learned in session.        Follow Up Instructions: I discussed the assessment and treatment plan with the patient. The patient was provided an opportunity to ask questions and all were answered. The patient agreed with the plan and demonstrated an understanding of the plan.   The patient was advised to call back or seek an in-person evaluation if the symptoms worsen or if the condition fails to improve as anticipated.   I provided 90 minutes of non-face-to-face time during the group encounter.     Amiree No S, LCAS

## 2023-03-03 ENCOUNTER — Ambulatory Visit (INDEPENDENT_AMBULATORY_CARE_PROVIDER_SITE_OTHER): Payer: No Typology Code available for payment source | Admitting: Licensed Clinical Social Worker

## 2023-03-03 ENCOUNTER — Encounter (HOSPITAL_COMMUNITY): Payer: Self-pay | Admitting: Licensed Clinical Social Worker

## 2023-03-03 DIAGNOSIS — F331 Major depressive disorder, recurrent, moderate: Secondary | ICD-10-CM | POA: Diagnosis not present

## 2023-03-03 DIAGNOSIS — F4310 Post-traumatic stress disorder, unspecified: Secondary | ICD-10-CM

## 2023-03-03 NOTE — Progress Notes (Signed)
Virtual Visit via video Note   I connected with Susan Guerra on 02/03/23 at 1:00-2:00 pm by video enabled telemedicine application and verified that I am speaking with the correct person using two identifiers.   Location: Patient: home Provider: home office   I discussed the limitations of evaluation and management by telemedicine and the availability of in person appointments. The patient expressed understanding and agreed to proceed.   History of Present Illness: Pt is referred to therapy for PTSD (MST) and MDD by the [facility]. Her biggest stressors are her health, taking care of her children (15 & 6) transitioning out of her 12 year relationship. Pt has fibromyalgia and other health issues, which affects her ability to take care of all her family's needs.  She has previous therapy.  Treatment Goals Addressed: Pt will recall the traumatic event without becoming too overwhelmed with emotions 50% of the time, evidenced by self report. Meet with clinician in person weekly for therapy to monitor for progress towards goals and address any barriers to success; Reduce depression from average severity level of 6/10 down to a 4/10 in next 90 days by engaging in 1-2 positive coping skills daily as part of developing self-care routine; Reduce average anxiety level from 7/10 down to 5/10 in next 90 days by utilizing 1-2 relaxation skills/grounding skills per day, such as mindful breathing, progressive muscle relaxation, positive visualizations.  Progression towards Goal: Progressing     Observations/Objective: Patient presented for today's session on time and was alert, oriented x5, with no evidence or self-report of SI/HI or A/V H. Patient reported ongoing compliance with medication and denied any use of alcohol or illicit substances.  Clinician inquired about patient's current emotional ratings, as well as any significant changes in thoughts, feelings or behavior since previous session. Patient's  emotional ratings: 7/10 for depression, 7/10 for anxiety, 2/10 for anger/irritability, recalling traumatic event without becoming too overwhelmed with emotions 50% of the time. All ratings are evidenced by self report. Cln explored trauma emotional ratings with pt who identified her trauma triggers and coping skills used this past week. Pt provided updates on health, family, x relationship, new relationship, job. Pt reports, "I'm not sleeping well which has exacerbated by fibromyalgia." Cln provided education on sleep hygiene. Cln also suggested pt reach out to [place] provider to discuss her medications. Cln also suggested pt use her thought stopping tools, practiced in session.           Collaboration of Care: Other: Continue with providers at [facility]  Patient/Guardian was advised Release of Information must be obtained prior to any record release in order to collaborate their care with an outside provider.   Patient/Guardian was advised if they have not already done so to contact the registration department to sign all necessary forms in order for [place] to release information regarding their care.   Consent: Patient/Guardian gives verbal consent for treatment and assignment of benefits for services provided during this visit. Patient/Guardian expressed understanding and agreed to proceed.  Assessment and Plan: Counselor will continue to meet with patient to address treatment plan goals. Patient will continue to follow recommendations of providers and implement skills learned in session.        Follow Up Instructions: I discussed the assessment and treatment plan with the patient. The patient was provided an opportunity to ask questions and all were answered. The patient agreed with the plan and demonstrated an understanding of the plan.   The patient was advised to call  back or seek an in-person evaluation if the symptoms worsen or if the condition fails to improve as anticipated.   I provided 60  minutes of non-face-to-face time during this encounter.     Json Koelzer S, LCAS

## 2023-03-04 ENCOUNTER — Encounter (HOSPITAL_COMMUNITY): Payer: Self-pay | Admitting: Licensed Clinical Social Worker

## 2023-03-04 NOTE — Progress Notes (Signed)
Virtual Visit via video Note   I connected with Susan Guerra on 02/10/23 at 1:00-2:00 pm by video enabled telemedicine application and verified that I am speaking with the correct person using two identifiers.   Location: Patient: home Provider: home office   I discussed the limitations of evaluation and management by telemedicine and the availability of in person appointments. The patient expressed understanding and agreed to proceed.   History of Present Illness: Pt is referred to therapy for PTSD (MST) and MDD by the [facility]. Her biggest stressors are her health, taking care of her children (15 & 6) transitioning out of her 12 year relationship. Pt has fibromyalgia and other health issues, which affects her ability to take care of all her family's needs.  She has previous therapy.  Treatment Goals Addressed: Pt will recall the traumatic event without becoming too overwhelmed with emotions 50% of the time, evidenced by self report. Meet with clinician in person weekly for therapy to monitor for progress towards goals and address any barriers to success; Reduce depression from average severity level of 6/10 down to a 4/10 in next 90 days by engaging in 1-2 positive coping skills daily as part of developing self-care routine; Reduce average anxiety level from 7/10 down to 5/10 in next 90 days by utilizing 1-2 relaxation skills/grounding skills per day, such as mindful breathing, progressive muscle relaxation, positive visualizations.  Progression towards Goal: Progressing     Observations/Objective: Patient presented for today's session on time and was alert, oriented x5, with no evidence or self-report of SI/HI or A/V H. Patient reported ongoing compliance with medication and denied any use of alcohol or illicit substances.  Clinician inquired about patient's current emotional ratings, as well as any significant changes in thoughts, feelings or behavior since previous session. Patient's  emotional ratings: 7/10 for depression, 7/10 for anxiety, 2/10 for anger/irritability, recalling traumatic event without becoming too overwhelmed with emotions 50% of the time. All ratings are evidenced by self report. Cln explored trauma emotional ratings with pt who identified her trauma triggers and coping skills used this past week. Pt provided updates on health, family, x relationship, new relationship, job. Pt reports, "My sleep is still no better, my health is no better. I'm thinking about filing for SSI. I've talked with my medication management provider." Cln asked open ended questions. Cln and pt reviewed SSI, pros and cons.             Collaboration of Care: Other: Continue with providers at [facility]  Patient/Guardian was advised Release of Information must be obtained prior to any record release in order to collaborate their care with an outside provider.   Patient/Guardian was advised if they have not already done so to contact the registration department to sign all necessary forms in order for [place] to release information regarding their care.   Consent: Patient/Guardian gives verbal consent for treatment and assignment of benefits for services provided during this visit. Patient/Guardian expressed understanding and agreed to proceed.  Assessment and Plan: Counselor will continue to meet with patient to address treatment plan goals. Patient will continue to follow recommendations of providers and implement skills learned in session.        Follow Up Instructions: I discussed the assessment and treatment plan with the patient. The patient was provided an opportunity to ask questions and all were answered. The patient agreed with the plan and demonstrated an understanding of the plan.   The patient was advised to call back  or seek an in-person evaluation if the symptoms worsen or if the condition fails to improve as anticipated.   I provided 60 minutes of non-face-to-face time during  this encounter.     Chistian Kasler S, LCAS

## 2023-03-04 NOTE — Progress Notes (Signed)
Virtual GroupVisit via video Note   I connected with Susan Guerra on 7/824 at 5:00 pm during group session by video enabled telemedicine application and verified that I am speaking with the correct person using two identifiers.   Location: Patient: home Provider: home office   I discussed the limitations of evaluation and management by telemedicine and the availability of in person appointments. The patient expressed understanding and agreed to proceed.   History of Present Illness: Pt is referred to therapy for PTSD (MST) and MDD by the [facility]. Her biggest stressors are her health, taking care of her children (15 & 6) transitioning out of her 12 year relationship. Pt has fibromyalgia and other health issues, which affects her ability to take care of all her family's needs.  She has previous therapy.  Treatment Goals Addressed: Pt will recall the traumatic event without becoming too overwhelmed with emotions 50% of the time, evidenced by self report. Meet with clinician in person weekly for therapy to monitor for progress towards goals and address any barriers to success; Reduce depression from average severity level of 6/10 down to a 4/10 in next 90 days by engaging in 1-2 positive coping skills daily as part of developing self-care routine; Reduce average anxiety level from 7/10 down to 5/10 in next 90 days by utilizing 1-2 relaxation skills/grounding skills per day, such as mindful breathing, progressive muscle relaxation, positive visualizations.  Progression towards Goal: Progressing     Observations/Objective:     Patient participated in a discussion on the stressors of fireworks during the July 4th celebrations. Patient discussed how the fireworks have been going off in her neighborhood for a few weeks and how it has affected /HeR mental health and PTSD symptoms. Patient was glad she had used her  identified coping skills to assist in the fireworks celebration.      Collaboration of Care: Other: None at this session    Patient/Guardian was advised Release of Information must be obtained prior to any record release in order to collaborate their care with an outside provider.   Patient/Guardian was advised if they have not already done so to contact the registration department to sign all necessary forms in order for [place] to release information regarding their care.   Consent: Patient/Guardian gives verbal consent for treatment and assignment of benefits for services provided during this visit. Patient/Guardian expressed understanding and agreed to proceed.  Assessment and Plan: Counselor will continue to meet with patient to address treatment plan goals. Patient will continue to follow recommendations of providers and implement skills learned in session.        Follow Up Instructions: I discussed the assessment and treatment plan with the patient. The patient was provided an opportunity to ask questions and all were answered. The patient agreed with the plan and demonstrated an understanding of the plan.   The patient was advised to call back or seek an in-person evaluation if the symptoms worsen or if the condition fails to improve as anticipated.   I provided 90 minutes of non-face-to-face time during the group encounter.     Robey Massmann S, LCAS

## 2023-03-04 NOTE — Progress Notes (Signed)
Virtual Visit via video Note   I connected with Susan Guerra on 02/17/23 at 1:00-2:00 pm by video enabled telemedicine application and verified that I am speaking with the correct person using two identifiers.   Location: Patient: home Provider: home office   I discussed the limitations of evaluation and management by telemedicine and the availability of in person appointments. The patient expressed understanding and agreed to proceed.   History of Present Illness: Pt is referred to therapy for PTSD (MST) and MDD by the [facility]. Her biggest stressors are her health, taking care of her children (15 & 6) transitioning out of her 12 year relationship. Pt has fibromyalgia and other health issues, which affects her ability to take care of all her family's needs.  She has previous therapy.  Treatment Goals Addressed: Pt will recall the traumatic event without becoming too overwhelmed with emotions 50% of the time, evidenced by self report. Meet with clinician in person weekly for therapy to monitor for progress towards goals and address any barriers to success; Reduce depression from average severity level of 6/10 down to a 4/10 in next 90 days by engaging in 1-2 positive coping skills daily as part of developing self-care routine; Reduce average anxiety level from 7/10 down to 5/10 in next 90 days by utilizing 1-2 relaxation skills/grounding skills per day, such as mindful breathing, progressive muscle relaxation, positive visualizations.  Progression towards Goal: Progressing     Observations/Objective: Patient presented for today's session on time and was alert, oriented x5, with no evidence or self-report of SI/HI or A/V H. Patient reported ongoing compliance with medication and denied any use of alcohol or illicit substances.  Clinician inquired about patient's current emotional ratings, as well as any significant changes in thoughts, feelings or behavior since previous session. Patient's  emotional ratings: 7/10 for depression, 7/10 for anxiety, 2/10 for anger/irritability, recalling traumatic event without becoming too overwhelmed with emotions 50% of the time. All ratings are evidenced by self report. Cln explored trauma emotional ratings with pt who identified her trauma triggers and coping skills used this past week. Pt provided updates on health, family, x relationship, new relationship, job.Cln engaged patient in discussion on identified positives to assess how her mood has been impacted. Clinician allowed space for pt to further process emotions and clinician provided supportive statements throughout session.. Cln suggested to patient continue to identify positives that  impact her mood. Pt learned how to recognize the physical, cognitive, emotional, and behavioral responses.   Collaboration of Care: Other: Continue with providers at [facility]  Patient/Guardian was advised Release of Information must be obtained prior to any record release in order to collaborate their care with an outside provider.   Patient/Guardian was advised if they have not already done so to contact the registration department to sign all necessary forms in order for [place] to release information regarding their care.   Consent: Patient/Guardian gives verbal consent for treatment and assignment of benefits for services provided during this visit. Patient/Guardian expressed understanding and agreed to proceed.  Assessment and Plan: Counselor will continue to meet with patient to address treatment plan goals. Patient will continue to follow recommendations of providers and implement skills learned in session.        Follow Up Instructions: I discussed the assessment and treatment plan with the patient. The patient was provided an opportunity to ask questions and all were answered. The patient agreed with the plan and demonstrated an understanding of the plan.  The patient was advised to call back or seek an  in-person evaluation if the symptoms worsen or if the condition fails to improve as anticipated.   I provided 60 minutes of non-face-to-face time during this encounter.     Stephnie Parlier S, LCAS

## 2023-03-05 ENCOUNTER — Encounter (HOSPITAL_COMMUNITY): Payer: Self-pay | Admitting: Licensed Clinical Social Worker

## 2023-03-05 NOTE — Progress Notes (Signed)
Virtual Visit via video Note   I connected with Susan Guerra on 02/24/23 at 1:00-2:00 pm by video enabled telemedicine application and verified that I am speaking with the correct person using two identifiers.   Location: Patient: home Provider: home office   I discussed the limitations of evaluation and management by telemedicine and the availability of in person appointments. The patient expressed understanding and agreed to proceed.   History of Present Illness: Pt is referred to therapy for PTSD (MST) and MDD by the [facility]. Her biggest stressors are her health, taking care of her children (15 & 6) transitioning out of her 12 year relationship. Pt has fibromyalgia and other health issues, which affects her ability to take care of all her family'Guerra needs.  She has previous therapy.  Treatment Goals Addressed: Pt will recall the traumatic event without becoming too overwhelmed with emotions 50% of the time, evidenced by self report. Meet with clinician in person weekly for therapy to monitor for progress towards goals and address any barriers to success; Reduce depression from average severity level of 6/10 down to a 4/10 in next 90 days by engaging in 1-2 positive coping skills daily as part of developing self-care routine; Reduce average anxiety level from 7/10 down to 5/10 in next 90 days by utilizing 1-2 relaxation skills/grounding skills per day, such as mindful breathing, progressive muscle relaxation, positive visualizations.  Progression towards Goal: Progressing     Observations/Objective: Patient presented for today'Guerra session on time and was alert, oriented x5, with no evidence or self-report of SI/HI or A/V H. Patient reported ongoing compliance with medication and denied any use of alcohol or illicit substances.  Clinician inquired about patient'Guerra current emotional ratings, as well as any significant changes in thoughts, feelings or behavior since previous session. Patient'Guerra  emotional ratings: 7/10 for depression, 7/10 for anxiety, 2/10 for anger/irritability, recalling traumatic event without becoming too overwhelmed with emotions 50% of the time. All ratings are evidenced by self report. Cln explored trauma emotional ratings with pt who identified her trauma triggers and coping skills used this past week. Pt provided updates on health, family, x relationship, new relationship, job, Sport and exercise psychologist. Marland KitchenCln used CBT to assist in identifying positives to assess how her mood has been impacted. Clinician allowed space for patient to further process emotions and clinician provided supportive statements throughout session. It was suggested to patient to continue to identify positives that  impact mood daily. Cln provided education on how to to recognize the physical, cognitive, emotional, and behavioral responses that influence her mood.     Collaboration of Care: Other: Continue with providers at [facility]  Patient/Guardian was advised Release of Information must be obtained prior to any record release in order to collaborate their care with an outside provider.   Patient/Guardian was advised if they have not already done so to contact the registration department to sign all necessary forms in order for [place] to release information regarding their care.   Consent: Patient/Guardian gives verbal consent for treatment and assignment of benefits for services provided during this visit. Patient/Guardian expressed understanding and agreed to proceed.  Assessment and Plan: Counselor will continue to meet with patient to address treatment plan goals. Patient will continue to follow recommendations of providers and implement skills learned in session.        Follow Up Instructions: I discussed the assessment and treatment plan with the patient. The patient was provided an opportunity to ask questions and all were answered. The patient  agreed with the plan and demonstrated an understanding of the plan.    The patient was advised to call back or seek an in-person evaluation if the symptoms worsen or if the condition fails to improve as anticipated.   I provided 60 minutes of non-face-to-face time during this encounter.     Susan Guerra, LCAS

## 2023-03-07 NOTE — Progress Notes (Signed)
Virtual Visit via video Note   I connected with Susan Guerra on 03/03/23 at 1:00-2:00 pm by video enabled telemedicine application and verified that I am speaking with the correct person using two identifiers.   Location: Patient: home Provider: home office   I discussed the limitations of evaluation and management by telemedicine and the availability of in person appointments. The patient expressed understanding and agreed to proceed.   History of Present Illness: Pt is referred to therapy for PTSD (MST) and MDD by the [facility]. Her biggest stressors are her health, taking care of her children (15 & 6) transitioning out of her 12 year relationship. Pt has fibromyalgia and other health issues, which affects her ability to take care of all her family's needs.  She has previous therapy.  Treatment Goals Addressed: Pt will recall the traumatic event without becoming too overwhelmed with emotions 50% of the time, evidenced by self report. Meet with clinician in person weekly for therapy to monitor for progress towards goals and address any barriers to success; Reduce depression from average severity level of 6/10 down to a 4/10 in next 90 days by engaging in 1-2 positive coping skills daily as part of developing self-care routine; Reduce average anxiety level from 7/10 down to 5/10 in next 90 days by utilizing 1-2 relaxation skills/grounding skills per day, such as mindful breathing, progressive muscle relaxation, positive visualizations.  Progression towards Goal: Progressing     Observations/Objective: Patient presented for today's session on time and was alert, oriented x5, with no evidence or self-report of SI/HI or A/V H. Patient reported ongoing compliance with medication and denied any use of alcohol or illicit substances.  Clinician inquired about patient's current emotional ratings, as well as any significant changes in thoughts, feelings or behavior since previous session. Patient's  emotional ratings: 7/10 for depression, 7/10 for anxiety, 2/10 for anger/irritability, recalling traumatic event without becoming too overwhelmed with emotions 50% of the time. All ratings are evidenced by self report. Cln explored trauma emotional ratings with pt who identified her trauma triggers and coping skills used this past week. Pt provided updates on health, family, x relationship, new relationship, job, SSDI. "Pt reports  "Last night I almost had a nervous breakdown. I'm so overwhelmed with everything in my life: health, working, 3 children, everyone demanding something from me. I'm wondering if I can continue to work.:" Cln asked open ended questions and assisted pt with compartmentalization. Clln requested pt get her medication manager to call me to collaborate about SSDI. Marland KitchenClinician provided thought stopping tools, as well as reality testing to provide Clinician utilized CBT to address thought processes support and confidence in her decisions.    Collaboration of Care: Other: Continue with providers at [facility]  Patient/Guardian was advised Release of Information must be obtained prior to any record release in order to collaborate their care with an outside provider.   Patient/Guardian was advised if they have not already done so to contact the registration department to sign all necessary forms in order for [place] to release information regarding their care.   Consent: Patient/Guardian gives verbal consent for treatment and assignment of benefits for services provided during this visit. Patient/Guardian expressed understanding and agreed to proceed.  Assessment and Plan: Counselor will continue to meet with patient to address treatment plan goals. Patient will continue to follow recommendations of providers and implement skills learned in session.        Follow Up Instructions: I discussed the assessment and treatment plan  with the patient. The patient was provided an opportunity to ask questions  and all were answered. The patient agreed with the plan and demonstrated an understanding of the plan.   The patient was advised to call back or seek an in-person evaluation if the symptoms worsen or if the condition fails to improve as anticipated.   I provided 60 minutes of non-face-to-face time during this encounter.     Kelsee Preslar S, LCAS

## 2023-03-09 ENCOUNTER — Ambulatory Visit (HOSPITAL_COMMUNITY): Payer: No Typology Code available for payment source | Admitting: Licensed Clinical Social Worker

## 2023-03-10 ENCOUNTER — Ambulatory Visit (INDEPENDENT_AMBULATORY_CARE_PROVIDER_SITE_OTHER): Payer: No Typology Code available for payment source | Admitting: Licensed Clinical Social Worker

## 2023-03-10 ENCOUNTER — Encounter (HOSPITAL_COMMUNITY): Payer: Self-pay | Admitting: Licensed Clinical Social Worker

## 2023-03-10 DIAGNOSIS — F4310 Post-traumatic stress disorder, unspecified: Secondary | ICD-10-CM | POA: Diagnosis not present

## 2023-03-10 DIAGNOSIS — F331 Major depressive disorder, recurrent, moderate: Secondary | ICD-10-CM

## 2023-03-10 NOTE — Progress Notes (Signed)
Virtual Visit via video Note   I connected with Susan Guerra on 03/10/23 at 1:15 pm by video enabled telemedicine application and verified that I am speaking with the correct person using two identifiers.   Location: Patient: home Provider: home office   I discussed the limitations of evaluation and management by telemedicine and the availability of in person appointments. The patient expressed understanding and agreed to proceed.   History of Present Illness: Pt is referred to therapy for PTSD (MST) and MDD by the [facility]. Her biggest stressors are her health, taking care of her children (15 & 6) transitioning out of her 12 year relationship. Pt has fibromyalgia and other health issues, which affects her ability to take care of all her family's needs.  She has previous therapy.  Treatment Goals Addressed: Pt will recall the traumatic event without becoming too overwhelmed with emotions 50% of the time, evidenced by self report. Meet with clinician in person weekly for therapy to monitor for progress towards goals and address any barriers to success; Reduce depression from average severity level of 6/10 down to a 4/10 in next 90 days by engaging in 1-2 positive coping skills daily as part of developing self-care routine; Reduce average anxiety level from 7/10 down to 5/10 in next 90 days by utilizing 1-2 relaxation skills/grounding skills per day, such as mindful breathing, progressive muscle relaxation, positive visualizations.  Progression towards Goal: Progressing  Diagnosis: Depression     Observations/Objective: Patient presented for today's session on time and was alert, oriented x5, with no evidence or self-report of SI/HI or A/V H. Patient reported ongoing compliance with medication and denied any use of alcohol or illicit substances.  Clinician inquired about patient's current emotional ratings, as well as any significant changes in thoughts, feelings or behavior since  previous session. Patient's emotional ratings: 7/10 for depression, 7/10 for anxiety, 2/10 for anger/irritability, recalling traumatic event without becoming too overwhelmed with emotions 50% of the time. All ratings are evidenced by self report. Cln explored trauma emotional ratings with pt who identified her trauma triggers and coping skills used this past week. Pt provided updates on health, family, x relationship, new relationship, job, SSDI. "Pt reports  "I"m doing better emotionally since last night but I'm having a lot of mixed feelings this week. My birthday and my mom's first birthday in [place] is this week. Cln and pt discussed alternatives to celebrate her own birthday as well as her moms heavenly birthday.Clinician provided thought stopping tools, as well as reality testing to provide pt with alternatives to sitting alone exacerbating her depressive symptoms and celebrate! Clinician utilized CBT to address thought processes support and confidence in her decisions. Again,Clln requested pt get her medication manager to call me to collaborate about SSDI. Marland Kitchen    Collaboration of Care: Other: Continue with providers at [facility]  Patient/Guardian was advised Release of Information must be obtained prior to any record release in order to collaborate their care with an outside provider.   Patient/Guardian was advised if they have not already done so to contact the registration department to sign all necessary forms in order for [place] to release information regarding their care.   Consent: Patient/Guardian gives verbal consent for treatment and assignment of benefits for services provided during this visit. Patient/Guardian expressed understanding and agreed to proceed.  Assessment and Plan: Counselor will continue to meet with patient to address treatment plan goals. Patient will continue to follow recommendations of providers and implement skills learned in  session.        Follow Up Instructions: I  discussed the assessment and treatment plan with the patient. The patient was provided an opportunity to ask questions and all were answered. The patient agreed with the plan and demonstrated an understanding of the plan.   The patient was advised to call back or seek an in-person evaluation if the symptoms worsen or if the condition fails to improve as anticipated.   I provided 45 minutes of non-face-to-face time during this encounter.     Reagen Goates S, LCAS

## 2023-03-12 ENCOUNTER — Ambulatory Visit
Admission: EM | Admit: 2023-03-12 | Discharge: 2023-03-12 | Disposition: A | Discharge status: Home / Self Care | Payer: No Typology Code available for payment source | Attending: Emergency Medicine | Admitting: Emergency Medicine

## 2023-03-12 ENCOUNTER — Encounter: Payer: Self-pay | Admitting: Emergency Medicine

## 2023-03-12 DIAGNOSIS — A084 Viral intestinal infection, unspecified: Secondary | ICD-10-CM

## 2023-03-12 MED ORDER — ONDANSETRON HCL 4 MG/2ML IJ SOLN
8.0000 mg | Freq: Once | INTRAMUSCULAR | Status: AC
  Administered 2023-03-12: 8 mg via INTRAMUSCULAR

## 2023-03-12 MED ORDER — DICYCLOMINE HCL 20 MG PO TABS: 20.0000 mg | ORAL_TABLET | Freq: Two times a day (BID) | ORAL | 0 refills | Status: AC

## 2023-03-12 MED ORDER — ONDANSETRON 8 MG PO TBDP: 8.0000 mg | ORAL_TABLET | Freq: Three times a day (TID) | ORAL | 0 refills | Status: AC | PRN

## 2023-03-12 MED ORDER — ONDANSETRON 8 MG PO TBDP
8.0000 mg | ORAL_TABLET | Freq: Once | ORAL | Status: AC
  Administered 2023-03-12: 8 mg via ORAL

## 2023-03-12 MED ORDER — LOPERAMIDE HCL 2 MG PO CAPS: 2.0000 mg | ORAL_CAPSULE | Freq: Four times a day (QID) | ORAL | 0 refills | Status: AC | PRN

## 2023-03-12 NOTE — ED Provider Notes (Signed)
[facility]    CSN: [id number] Arrival date & time: 03/12/23  1552      History   Chief Complaint Chief Complaint  Patient presents with   Abdominal Pain   Diarrhea   Nausea   Dizziness    HPI Susan Guerra is a 47 y.o. female.   Patient presents for evaluation of bilateral lower abdominal pain, nausea without vomiting and diarrhea beginning 3 days ago.  Abdominal pain occurring intermittently, described as a cramping, severe when present.  Last occurrence of diarrhea 1 day ago, while stools more formed has had frequent bowel movements throughout the day, denies presence of mucus or blood.  Nausea has been persistent with associated dizziness, feeling as if she is motion sick.  Minimal food consumption and fluid intake, able to tolerate water.  Denies dietary changes, recent travel.  No known sick contacts prior.  Has attempted use of Alka-Seltzer which has been ineffective.  History of IBS but this feels different.  Denies presence of fever or URI symptoms.  Child at home has similar symptoms but not to this severity.  Past Medical History:  Diagnosis Date   GAD (generalized anxiety disorder)    Tremors of nervous system     Patient Active Problem List   Diagnosis Date Noted   PTSD (post-traumatic stress disorder) 12/10/2020   MDD (major depressive disorder) 12/10/2020   Atypical migraine 03/04/2020    Past Surgical History:  Procedure Laterality Date   ABDOMINAL HYSTERECTOMY     HIP SURGERY     shoulder  suregery      OB History   No obstetric history on file.      Home Medications    Prior to Admission medications   Medication Sig Start Date End Date Taking? Authorizing Provider  AJOVY 225 MG/1.5ML SOSY Inject 1.5 mLs into the skin every 30 (thirty) days.  01/10/20   [provider]  albuterol (PROVENTIL HFA) 108 (90 Base) MCG/ACT inhaler every 4 (four) hours as needed. 05/18/12   [provider]  Armodafinil 50 MG tablet Take  1  tablet in QAM for  2 days; if tolerated , take  2 tablets in AM. Patient not taking: Reported on 03/04/2020 08/04/17   [provider]  ASMANEX, 60 METERED DOSES, 220 MCG/INH inhaler Inhale 2 puffs into the lungs at bedtime.  11/01/18   [provider]  cetirizine (ZYRTEC) 10 MG tablet Take 10 mg by mouth at bedtime as needed.  09/28/18   [provider]  chlorpheniramine-HYDROcodone (TUSSIONEX PENNKINETIC ER) 10-8 MG/5ML SUER Take 5 mLs by mouth every 12 (twelve) hours as needed for cough. 06/30/19   Verlee Monte, NP  Cholecalciferol (VITAMIN D3 PO) Take 2 tablets by mouth daily.    [provider]  clonazePAM (KLONOPIN) 2 MG tablet       divalproex (DEPAKOTE ER) 500 MG 24 hr tablet  12/31/18   [provider]  DULoxetine (CYMBALTA) 30 MG capsule Take 90 mg by mouth daily.  06/21/18   [provider]  ferrous sulfate 325 (65 FE) MG EC tablet Take 325 mg by mouth daily.    [provider]  levonorgestrel (MIRENA) 20 MCG/24HR IUD by Intrauterine route.    [provider]  modafinil (PROVIGIL) 200 MG tablet Take 200 mg by mouth daily.  09/28/18   [provider]  Multiple Vitamin (MULTIVITAMIN WITH MINERALS) TABS tablet Take 1 tablet by mouth daily.    [provider]  naproxen (  NAPROSYN) 500 MG tablet Take 1 tablet (500 mg total) by mouth 2 (two) times daily with a meal. 12/01/22   Brimage, Vondra, DO  ondansetron (ZOFRAN-ODT) 4 MG disintegrating tablet Take 1 tablet (4 mg total) by mouth every 8 (eight) hours as needed for nausea or vomiting. 01/03/22   Sharman Cheek, MD  polyethylene glycol powder (MIRALAX) 17 GM/SCOOP powder 1 scoop (17 grams) daily as needed for constipation. Patient not taking: Reported on 03/04/2020 06/30/19   Verlee Monte, NP  pregabalin (LYRICA) 100 MG capsule  09/28/18   [provider]  senna-docusate (SENOKOT-S) 8.6-50 MG tablet Take 2 tablets by mouth 2 (two) times daily.  01/03/22   Sharman Cheek, MD  SUMAtriptan (IMITREX) 100 MG tablet Take 100 mg by mouth every 2 (two) hours as needed.  10/11/15   [provider]  tiZANidine (ZANAFLEX) 2 MG tablet Take 1 tablet (2 mg total) by mouth every 6 (six) hours as needed for muscle spasms. 12/01/22   Brimage, Seward Meth, DO  traMADol (ULTRAM) 50 MG tablet Take 1 tablet (50 mg total) by mouth every 6 (six) hours as needed. 01/03/22   Sharman Cheek, MD  traZODone (DESYREL) 100 MG tablet Take 100 mg by mouth at bedtime.  11/01/18   [provider]  zolmitriptan (ZOMIG) 5 MG tablet Take 5 mg by mouth every 2 (two) hours as needed.  12/31/18   [provider]  zonisamide (ZONEGRAN) 100 MG capsule Take by mouth. Patient not taking: Reported on 03/04/2020 07/17/15   [provider]    Family History Family History  Problem Relation Age of Onset   Diabetes Mother    Heart failure Mother    Hypertension Mother    Cancer Father    Heart failure Father    Alzheimer's disease Father     Social History Social History   Tobacco Use   Smoking status: Never   Smokeless tobacco: Never  Vaping Use   Vaping status: Never Used  Substance Use Topics   Alcohol use: Not Currently   Drug use: Never     Allergies   Aspirin, Buspirone, Darunavir, Pseudoephedrine-acetaminophen, Sulfa antibiotics, Zolpidem, and Latex   Review of Systems Review of Systems   Physical Exam Triage Vital Signs ED Triage Vitals  Encounter Vitals Group     BP 03/12/23 1611 123/87     Systolic BP Percentile --      Diastolic BP Percentile --      Pulse Rate 03/12/23 1611 66     Resp 03/12/23 1611 16     Temp 03/12/23 1611 98.3 F (36.8 C)     Temp Source 03/12/23 1611 Oral     SpO2 03/12/23 1611 98 %     Weight --      Height --      Head Circumference --      Peak Flow --      Pain Score 03/12/23 1610 8     Pain Loc --      Pain Education --      Exclude from Growth Chart --    No data  found.  Updated Vital Signs BP 123/87 (BP Location: Left Arm)   Pulse 66   Temp 98.3 F (36.8 C) (Oral)   Resp 16   LMP 06/20/2019 Comment: signed preg waiver  SpO2 98%   Visual Acuity Right Eye Distance:   Left Eye Distance:   Bilateral Distance:    Right Eye Near:   Left Eye  Near:    Bilateral Near:     Physical Exam Constitutional:      Appearance: She is ill-appearing.  Eyes:     Extraocular Movements: Extraocular movements intact.  Pulmonary:     Effort: Pulmonary effort is normal.  Abdominal:     General: Abdomen is flat. Bowel sounds are increased.     Palpations: Abdomen is soft.     Tenderness: There is abdominal tenderness in the epigastric area and periumbilical area.  Neurological:     Mental Status: She is alert and oriented to person, place, and time. Mental status is at baseline.      UC Treatments / Results  Labs (all labs ordered are listed, but only abnormal results are displayed) Labs Reviewed - No data to display  EKG   Radiology No results found.  Procedures Procedures (including critical care time)  Medications Ordered in UC Medications - No data to display  Initial Impression / Assessment and Plan / UC Course  I have reviewed the triage vital signs and the nursing notes.  Pertinent labs & imaging results that were available during my care of the patient were reviewed by me and considered in my medical decision making (see chart for details).  Viral gastroenteritis  Vital signs are stable while patient is irritable and she is in no signs of distress, curled in fetal position laying on exam table, tenderness is present to the epigastric and periumbilical region, consistent with gastritis, etiology most likely viral as child who is also being examined today has similar symptoms but not to the severity, at this time not complaining of abdominal pain but complaining of severe nausea and motion sickness, given 4 mg of IM Zofran with no  change in response, given 8 mg of Zofran ODT, reevaluation after 15 to 20 minutes, symptoms have begun to subside, given cup of water and advised to sip, reevaluation after 5 to 10 minutes able to tolerate fluids, stable for outpatient management, viral illness most likely exacerbating IBS, prescribed Bentyl, Zofran and Imodium recommend increase fluid intake until able to tolerate food at baseline, recommended bland diet with follow-up as needed Final Clinical Impressions(s) / UC Diagnoses   Final diagnoses:  None   Discharge Instructions   None    ED Prescriptions   None    PDMP not reviewed this encounter.   Valinda Hoar, NP 03/12/23 1754

## 2023-03-12 NOTE — Discharge Instructions (Signed)
Your symptoms are most likely caused by a virus, it will work its way out your system over the next few days, symptoms most likely exacerbating her IBS  Take Bentyl every morning and every evening for up to 10 days to help reduce stomach cramping  You have been given an injection and oral dose of Zofran to help with nausea and dizziness  You can use zofran every 8 hours as needed for nausea, be mindful this medication may make you drowsy, take the first dose at home to see how it affects your body  You can use Imodium to help slow bowel movements and be mindful over use of this medication may cause opposite effect constipation  You can use over-the-counter ibuprofen or Tylenol, which ever you have at home, to help manage fevers  Continue to promote hydration throughout the day by using electrolyte replacement solution such as Gatorade, body armor, Pedialyte, which ever you have at home  Try eating bland foods such as bread, rice, toast, fruit which are easier on the stomach to digest, avoid foods that are overly spicy, overly seasoned or greasy

## 2023-03-12 NOTE — ED Triage Notes (Signed)
Pt presents abdominal pain, diarrhea and nausea x 2 days.

## 2023-03-13 ENCOUNTER — Emergency Department
Admission: EM | Admit: 2023-03-13 | Discharge: 2023-03-13 | Disposition: A | Discharge status: Home / Self Care | Payer: No Typology Code available for payment source | Attending: Emergency Medicine | Admitting: Emergency Medicine

## 2023-03-13 ENCOUNTER — Emergency Department: Payer: No Typology Code available for payment source

## 2023-03-13 ENCOUNTER — Other Ambulatory Visit: Payer: Self-pay

## 2023-03-13 DIAGNOSIS — R1032 Left lower quadrant pain: Secondary | ICD-10-CM | POA: Diagnosis present

## 2023-03-13 DIAGNOSIS — R1084 Generalized abdominal pain: Secondary | ICD-10-CM

## 2023-03-13 DIAGNOSIS — K529 Noninfective gastroenteritis and colitis, unspecified: Secondary | ICD-10-CM | POA: Insufficient documentation

## 2023-03-13 LAB — CBC
HCT: 39.3 % (ref 36.0–46.0)
Hemoglobin: 12.5 g/dL (ref 12.0–15.0)
MCH: 27.3 pg (ref 26.0–34.0)
MCHC: 31.8 g/dL (ref 30.0–36.0)
MCV: 85.8 fL (ref 80.0–100.0)
Platelets: 351 10*3/uL (ref 150–400)
RBC: 4.58 MIL/uL (ref 3.87–5.11)
RDW: 13.2 % (ref 11.5–15.5)
WBC: 6.1 10*3/uL (ref 4.0–10.5)
nRBC: 0 % (ref 0.0–0.2)

## 2023-03-13 LAB — COMPREHENSIVE METABOLIC PANEL
ALT: 84 U/L — ABNORMAL HIGH (ref 0–44)
AST: 39 U/L (ref 15–41)
Albumin: 4.2 g/dL (ref 3.5–5.0)
Alkaline Phosphatase: 42 U/L (ref 38–126)
Anion gap: 11 (ref 5–15)
BUN: 12 mg/dL (ref 6–20)
CO2: 23 mmol/L (ref 22–32)
Calcium: 9 mg/dL (ref 8.9–10.3)
Chloride: 103 mmol/L (ref 98–111)
Creatinine, Ser: 0.92 mg/dL (ref 0.44–1.00)
GFR, Estimated: >60 mL/min (ref >60)
Glucose, Bld: 88 mg/dL (ref 70–99)
Potassium: 3.5 mmol/L (ref 3.5–5.1)
Sodium: 137 mmol/L (ref 135–145)
Total Bilirubin: 0.5 mg/dL (ref 0.3–1.2)
Total Protein: 7.5 g/dL (ref 6.5–8.1)

## 2023-03-13 LAB — LIPASE, BLOOD: Lipase: 29 U/L (ref 11–51)

## 2023-03-13 MED ORDER — SODIUM CHLORIDE 0.9 % IV BOLUS
1000.0000 mL | Freq: Once | INTRAVENOUS | Status: AC
  Administered 2023-03-13: 1000 mL via INTRAVENOUS

## 2023-03-13 MED ORDER — IOHEXOL 300 MG/ML  SOLN
100.0000 mL | Freq: Once | INTRAMUSCULAR | Status: AC | PRN
  Administered 2023-03-13: 100 mL via INTRAVENOUS

## 2023-03-13 MED ORDER — METOCLOPRAMIDE HCL 5 MG/ML IJ SOLN
5.0000 mg | Freq: Once | INTRAMUSCULAR | Status: AC
  Administered 2023-03-13: 5 mg via INTRAVENOUS
  Filled 2023-03-13: qty 2

## 2023-03-13 MED ORDER — MORPHINE SULFATE (PF) 4 MG/ML IV SOLN
4.0000 mg | Freq: Once | INTRAVENOUS | Status: AC
  Administered 2023-03-13: 4 mg via INTRAVENOUS
  Filled 2023-03-13: qty 1

## 2023-03-13 MED ORDER — ONDANSETRON HCL 4 MG/2ML IJ SOLN
4.0000 mg | Freq: Once | INTRAMUSCULAR | Status: AC
  Administered 2023-03-13: 4 mg via INTRAVENOUS
  Filled 2023-03-13: qty 2

## 2023-03-13 NOTE — ED Notes (Signed)
Patient transported to CT 

## 2023-03-13 NOTE — ED Notes (Signed)
First Nurse Note: Pt to ED via POV. Pt seen at [facility] yesterday for same. Pt having lower abdominal cramping, nausea, and diarrhea. Pt family member states that pt was instructed to come to the ED for a "drip" if she was not feeling better today. Pt is in NAD.

## 2023-03-13 NOTE — Discharge Instructions (Addendum)
You are seen in the emergency department for abdominal cramping with nausea, vomiting and diarrhea.  You have a CT scan that did not show any findings to explain your symptoms today.  Your lab work was normal.  Urgent care had called you in Bentyl and Zofran to [facility].  Stay hydrated and drink plenty of fluids.  Follow-up with your primary care physician.  zofran (ondansetron) - nausea medication, take 1 tablet every 8 hours as needed for nausea/vomiting.  Pain control:  Acetaminophen (tylenol) - You can take 2 extra strength tablets (1000 mg) every 6 hours as needed for pain/fever.  Make sure you eat food/drink water when taking these medications.

## 2023-03-13 NOTE — ED Notes (Signed)
Pt given saltine crackers and a cup of ice water.

## 2023-03-13 NOTE — ED Provider Notes (Signed)
[facility] Provider Note    Event Date/Time   First MD Initiated Contact with Patient 03/13/23 1006     (approximate)   History   Abdominal Pain   HPI  Susan Guerra is a 47 y.o. female past medical history significant for fibromyalgia, vestibular migraines, presents to the emergency department with abdominal pain, diarrhea and vomiting.  Patient states that she has not been feeling well since Tuesday.  Her friend's child also has been sick recently but states that he is not as bad as she is.  Complaining of severe abdominal cramping that is worse in the left lower abdomen in the upper abdomen.  Last episode of diarrhea was yesterday.  Denies any blood in her stool.  Ongoing nausea but no episodes of vomiting today.  Denies any fever.  Denies dysuria, urinary urgency or frequency.  Prior hysterectomy and left oophorectomy.     Physical Exam   Triage Vital Signs: ED Triage Vitals [03/13/23 0958]  Encounter Vitals Group     BP 133/85     Systolic BP Percentile      Diastolic BP Percentile      Pulse Rate 70     Resp 18     Temp 98.4 F (36.9 C)     Temp Source Oral     SpO2 100 %     Weight 124 lb 12.5 oz (56.6 kg)     Height 5' 5.5" (1.664 m)     Head Circumference      Peak Flow      Pain Score 10     Pain Loc      Pain Education      Exclude from Growth Chart     Most recent vital signs: Vitals:   03/13/23 0958 03/13/23 1157  BP: 133/85 116/80  Pulse: 70 60  Resp: 18 17  Temp: 98.4 F (36.9 C)   SpO2: 100% 100%    Physical Exam Constitutional:      Appearance: She is well-developed.  HENT:     Head: Atraumatic.  Eyes:     Conjunctiva/sclera: Conjunctivae normal.  Cardiovascular:     Rate and Rhythm: Regular rhythm.  Pulmonary:     Effort: No respiratory distress.  Abdominal:     General: There is no distension.     Tenderness: There is abdominal tenderness in the epigastric area, periumbilical area and left lower quadrant.   Musculoskeletal:        General: Normal range of motion.     Cervical back: Normal range of motion.  Skin:    General: Skin is warm.  Neurological:     Mental Status: She is alert. Mental status is at baseline.     IMPRESSION / MDM / ASSESSMENT AND PLAN / ED COURSE  I reviewed the triage vital signs and the nursing notes.  Differential diagnosis including viral illness of viral gastroenteritis, diverticulitis, bowel obstruction, intra-abdominal abscess.  No right lower quadrant abdominal pain, have a low suspicion for acute appendicitis.  Does not have a left ovary and has had a prior hysterectomy.  No concern for pregnancy.  Low suspicion for ACS, no chest pain.  On chart review evaluated to urgent care yesterday with similar symptoms with abdominal cramping, nausea vomiting and diarrhea.   No tachycardic or bradycardic dysrhythmias while on cardiac telemetry.  RADIOLOGY I independently reviewed imaging, my interpretation of imaging: CT abdomen and pelvis with contrast -no acute findings.  LABS (all labs ordered are  listed, but only abnormal results are displayed) Labs interpreted as -    Labs Reviewed  COMPREHENSIVE METABOLIC PANEL - Abnormal; Notable for the following components:      Result Value   ALT 84 (*)    All other components within normal limits  LIPASE, BLOOD  CBC  URINALYSIS, ROUTINE W REFLEX MICROSCOPIC     MDM  Lab work reassuring with no significant leukocytosis or anemia.  Creatinine at baseline with no significant electrolyte abnormalities.  Lipase within normal limits.  Patient was treated with IV pain medication, IV antiemetics and IV fluids.   CT scan with no acute findings.  Lab work reassuring.  Patient p.o. challenged and able to tolerate p.o.  Most likely with a viral gastroenteritis.  Discussed return precautions and staying hydrated.  Discussed follow-up with primary care provider.   PROCEDURES:  Critical Care performed:  No  Procedures  Patient's presentation is most consistent with acute presentation with potential threat to life or bodily function.   MEDICATIONS ORDERED IN ED: Medications  sodium chloride 0.9 % bolus 1,000 mL (1,000 mLs Intravenous New Bag/Given 03/13/23 1113)  ondansetron (ZOFRAN) injection 4 mg (4 mg Intravenous Given 03/13/23 1112)  morphine (PF) 4 MG/ML injection 4 mg (4 mg Intravenous Given 03/13/23 1112)  iohexol (OMNIPAQUE) 300 MG/ML solution 100 mL (100 mLs Intravenous Contrast Given 03/13/23 1131)  sodium chloride 0.9 % bolus 1,000 mL (1,000 mLs Intravenous New Bag/Given 03/13/23 1240)  metoCLOPramide (REGLAN) injection 5 mg (5 mg Intravenous Given 03/13/23 1322)    FINAL CLINICAL IMPRESSION(S) / ED DIAGNOSES   Final diagnoses:  Generalized abdominal pain  Gastroenteritis     Rx / DC Orders   ED Discharge Orders     None        Note:  This document was prepared using Dragon voice recognition software and may include unintentional dictation errors.   Corena Herter, MD 03/13/23 1355

## 2023-03-13 NOTE — ED Triage Notes (Signed)
Pt here with abd cramping and pain. [facility] yes[facility] and was dx with viral GI illness. Pt in triage moaning in pain. Pt denies NVD.

## 2023-03-16 ENCOUNTER — Ambulatory Visit (HOSPITAL_COMMUNITY): Payer: No Typology Code available for payment source | Admitting: Licensed Clinical Social Worker

## 2023-03-23 ENCOUNTER — Encounter (HOSPITAL_COMMUNITY): Payer: Self-pay | Admitting: Licensed Clinical Social Worker

## 2023-03-23 ENCOUNTER — Ambulatory Visit (INDEPENDENT_AMBULATORY_CARE_PROVIDER_SITE_OTHER): Payer: No Typology Code available for payment source | Admitting: Licensed Clinical Social Worker

## 2023-03-23 DIAGNOSIS — F331 Major depressive disorder, recurrent, moderate: Secondary | ICD-10-CM

## 2023-03-23 DIAGNOSIS — F4310 Post-traumatic stress disorder, unspecified: Secondary | ICD-10-CM

## 2023-03-23 NOTE — Progress Notes (Signed)
Virtual GroupVisit via video Note   I connected with Susan Guerra on 03/23/23/24 at 5:00 pm during group session by video enabled telemedicine application and verified that I am speaking with the correct person using two identifiers.   Location: Patient: home Provider: home office   I discussed the limitations of evaluation and management by telemedicine and the availability of in person appointments. The patient expressed understanding and agreed to proceed.   History of Present Illness: Pt is referred to therapy for PTSD (MST) and MDD by the [facility]. Her biggest stressors are her health, taking care of her children (15 & 6) transitioning out of her 12 year relationship. Pt has fibromyalgia and other health issues, which affects her ability to take care of all her family's needs.  She has previous therapy.  Treatment Goals Addressed: Pt will recall the traumatic event without becoming too overwhelmed with emotions 50% of the time, evidenced by self report. Meet with clinician in person weekly for therapy to monitor for progress towards goals and address any barriers to success; Reduce depression from average severity level of 6/10 down to a 4/10 in next 90 days by engaging in 1-2 positive coping skills daily as part of developing self-care routine; Reduce average anxiety level from 7/10 down to 5/10 in next 90 days by utilizing 1-2 relaxation skills/grounding skills per day, such as mindful breathing, progressive muscle relaxation, positive visualizations.  Progression towards Goal: Progressing     Observations/Objective:  Patient participated in relationships: communication (listening and communicating with openness and honesty), togetherness (sharing similar values and beliefs and sharing activities (things in common). Patient was encouraged to have open and positive communication, togetherness and similar activities in relationships.    Collaboration of Care: Other: None at this  session    Patient/Guardian was advised Release of Information must be obtained prior to any record release in order to collaborate their care with an outside provider.   Patient/Guardian was advised if they have not already done so to contact the registration department to sign all necessary forms in order for [place] to release information regarding their care.   Consent: Patient/Guardian gives verbal consent for treatment and assignment of benefits for services provided during this visit. Patient/Guardian expressed understanding and agreed to proceed.   Assessment and Plan: Counselor will continue to meet with patient to address treatment plan goals. Patient will continue to follow recommendations of providers and implement skills learned in session and practice coping skills between  Sessions.     Follow Up Instructions: I discussed the assessment and treatment plan with the patient. The patient was provided an opportunity to ask questions and all were answered. The patient agreed with the plan and demonstrated an understanding of the plan.   The patient was advised to call back or seek an in-person evaluation if the symptoms worsen or if the condition fails to improve as anticipated.   I provided 90 minutes of non-face-to-face time during the group encounter.     Hanley Rispoli S, LCAS

## 2023-03-24 ENCOUNTER — Encounter (HOSPITAL_COMMUNITY): Payer: Self-pay | Admitting: Licensed Clinical Social Worker

## 2023-03-24 ENCOUNTER — Ambulatory Visit (INDEPENDENT_AMBULATORY_CARE_PROVIDER_SITE_OTHER): Payer: No Typology Code available for payment source | Admitting: Licensed Clinical Social Worker

## 2023-03-24 DIAGNOSIS — F331 Major depressive disorder, recurrent, moderate: Secondary | ICD-10-CM

## 2023-03-24 DIAGNOSIS — F4310 Post-traumatic stress disorder, unspecified: Secondary | ICD-10-CM

## 2023-03-24 NOTE — Addendum Note (Signed)
Addended by: Vernona Rieger on: 03/24/2023 01:10 PM   Modules accepted: Level of Service

## 2023-03-24 NOTE — Progress Notes (Signed)
Virtual Visit via video Note   I connected with Susan Guerra on 03/24/23 at 1:15 pm by video enabled telemedicine application and verified that I am speaking with the correct person using two identifiers.   Location: Patient: home Provider: home office   I discussed the limitations of evaluation and management by telemedicine and the availability of in person appointments. The patient expressed understanding and agreed to proceed.   History of Present Illness: Pt is referred to therapy for PTSD (MST) and MDD by the [facility]. Her biggest stressors are her health, taking care of her children (15 & 6) transitioning out of her 12 year relationship. Pt has fibromyalgia and other health issues, which affects her ability to take care of all her family's needs.  She has previous therapy.  Treatment Goals Addressed: Pt will recall the traumatic event without becoming too overwhelmed with emotions 50% of the time, evidenced by self report. Meet with clinician in person weekly for therapy to monitor for progress towards goals and address any barriers to success; Reduce depression from average severity level of 6/10 down to a 4/10 in next 90 days by engaging in 1-2 positive coping skills daily as part of developing self-care routine; Reduce average anxiety level from 7/10 down to 5/10 in next 90 days by utilizing 1-2 relaxation skills/grounding skills per day, such as mindful breathing, progressive muscle relaxation, positive visualizations.  Progression towards Goal: Progressing  Diagnosis: Depression     Observations/Objective: Patient presented for today's session on time and was alert, oriented x5, with no evidence or self-report of SI/HI or A/V H. Patient reported ongoing compliance with medication and denied any use of alcohol or illicit substances.  Clinician inquired about patient's current emotional ratings, as well as any significant changes in thoughts, feelings or behavior since previous  session. Patient's emotional ratings: 7/10 for depression, 7/10 for anxiety, 2/10 for anger/irritability, recalling traumatic event without becoming too overwhelmed with emotions 50% of the time. All ratings are evidenced by self report. Cln explored trauma emotional ratings with pt who identified her trauma triggers and coping skills used this past week. Pt provided updates on health, family, x relationship, new relationship, job, SSDI. "Pt reports  "I haven't been feeling well for the last weeks due to stomach issues and ended up in the ER. My emotions have been up/down mostly because of x relationship. My and my mothers heavenly birthday was last week so wasn't doing well emotionally, still grieving,which exacerbated my depressive symptoms." Cln reviewed the cycle of depression with pt and coping skills that can be used. Pt practiced in session. Validated and normalized patient's feelings while redirecting his focus to things that he can control, such as thought stopping strategies, building trusts again, strengthening coping skills, and working on his self. Patient expressed tendency to overanalyze the whys when dealing with things out of his control. Briefly introduced concept of thinking errors and will explore further in upcoming session.        Collaboration of Care: Other: Continue with providers at [facility]  Patient/Guardian was advised Release of Information must be obtained prior to any record release in order to collaborate their care with an outside provider.   Patient/Guardian was advised if they have not already done so to contact the registration department to sign all necessary forms in order for [place] to release information regarding their care.   Consent: Patient/Guardian gives verbal consent for treatment and assignment of benefits for services provided during this visit. Patient/Guardian expressed  understanding and agreed to proceed.  Assessment and Plan: Counselor will continue to meet  with patient to address treatment plan goals. Patient will continue to follow recommendations of providers and implement skills learned in session and practice skills between sessions. Diagnosis: MDD and PTSD       Follow Up Instructions: I discussed the assessment and treatment plan with the patient. The patient was provided an opportunity to ask questions and all were answered. The patient agreed with the plan and demonstrated an understanding of the plan.   The patient was advised to call back or seek an in-person evaluation if the symptoms worsen or if the condition fails to improve as anticipated.   I provided 45 minutes of non-face-to-face time during this encounter.     Jahanna Raether S, LCAS

## 2023-03-30 ENCOUNTER — Ambulatory Visit (INDEPENDENT_AMBULATORY_CARE_PROVIDER_SITE_OTHER): Payer: No Typology Code available for payment source | Admitting: Licensed Clinical Social Worker

## 2023-03-30 DIAGNOSIS — F4310 Post-traumatic stress disorder, unspecified: Secondary | ICD-10-CM | POA: Diagnosis not present

## 2023-03-30 DIAGNOSIS — F331 Major depressive disorder, recurrent, moderate: Secondary | ICD-10-CM

## 2023-03-31 ENCOUNTER — Ambulatory Visit (INDEPENDENT_AMBULATORY_CARE_PROVIDER_SITE_OTHER): Payer: No Typology Code available for payment source | Admitting: Licensed Clinical Social Worker

## 2023-03-31 DIAGNOSIS — F331 Major depressive disorder, recurrent, moderate: Secondary | ICD-10-CM

## 2023-03-31 DIAGNOSIS — F4310 Post-traumatic stress disorder, unspecified: Secondary | ICD-10-CM | POA: Diagnosis not present

## 2023-04-05 ENCOUNTER — Encounter (HOSPITAL_COMMUNITY): Payer: Self-pay | Admitting: Licensed Clinical Social Worker

## 2023-04-05 NOTE — Progress Notes (Signed)
Virtual GroupVisit via video Note   I connected with Susan Guerra on 03/30/23 at 5:00 pm during group session by video enabled telemedicine application and verified that I am speaking with the correct person using two identifiers.   Location: Patient: home Provider: home office   I discussed the limitations of evaluation and management by telemedicine and the availability of in person appointments. The patient expressed understanding and agreed to proceed.   History of Present Illness: Pt is referred to therapy for PTSD (MST) and MDD by the [facility]. Her biggest stressors are her health, taking care of her children (15 & 6) transitioning out of her 12 year relationship. Pt has fibromyalgia and other health issues, which affects her ability to take care of all her family's needs.  She has previous therapy.  Treatment Goals Addressed: Pt will recall the traumatic event without becoming too overwhelmed with emotions 50% of the time, evidenced by self report. Meet with clinician in person weekly for therapy to monitor for progress towards goals and address any barriers to success; Reduce depression from average severity level of 6/10 down to a 4/10 in next 90 days by engaging in 1-2 positive coping skills daily as part of developing self-care routine; Reduce average anxiety level from 7/10 down to 5/10 in next 90 days by utilizing 1-2 relaxation skills/grounding skills per day, such as mindful breathing, progressive muscle relaxation, positive visualizations.  Progression towards Goal: Progressing     Observations/Objective:  Patient participated in a group discussion on believing in yourself, having faith in your own capabilities. Cln disucssed about believing in self, you can overcome self-doubt and have the confidence to take action and get things done.  Patient was encouraged to continue believing in herself."Having self-doubt increases my ptsd symptoms and depression."   Collaboration  of Care: Other: None at this session    Patient/Guardian was advised Release of Information must be obtained prior to any record release in order to collaborate their care with an outside provider.   Patient/Guardian was advised if they have not already done so to contact the registration department to sign all necessary forms in order for [place] to release information regarding their care.   Consent: Patient/Guardian gives verbal consent for treatment and assignment of benefits for services provided during this visit. Patient/Guardian expressed understanding and agreed to proceed.   Assessment and Plan: Counselor will continue to meet with patient to address treatment plan goals. Patient will continue to follow recommendations of providers and implement skills learned in session and practice coping skills between  Sessions.Diagnosis: depression and ptsd     Follow Up Instructions: I discussed the assessment and treatment plan with the patient. The patient was provided an opportunity to ask questions and all were answered. The patient agreed with the plan and demonstrated an understanding of the plan.   The patient was advised to call back or seek an in-person evaluation if the symptoms worsen or if the condition fails to improve as anticipated.   I provided 90 minutes of non-face-to-face time during the group encounter.     Fleet Higham S, LCAS

## 2023-04-06 ENCOUNTER — Encounter (HOSPITAL_COMMUNITY): Payer: Self-pay | Admitting: Licensed Clinical Social Worker

## 2023-04-06 ENCOUNTER — Ambulatory Visit (INDEPENDENT_AMBULATORY_CARE_PROVIDER_SITE_OTHER): Payer: No Typology Code available for payment source | Admitting: Licensed Clinical Social Worker

## 2023-04-06 DIAGNOSIS — F4310 Post-traumatic stress disorder, unspecified: Secondary | ICD-10-CM | POA: Diagnosis not present

## 2023-04-06 DIAGNOSIS — F331 Major depressive disorder, recurrent, moderate: Secondary | ICD-10-CM

## 2023-04-06 NOTE — Progress Notes (Signed)
Virtual GroupVisit via video Note   I connected with Susan Guerra on 04/06/23 at 5:00 pm during group session by video enabled telemedicine application and verified that I am speaking with the correct person using two identifiers.   Location: Patient: home Provider: home office   I discussed the limitations of evaluation and management by telemedicine and the availability of in person appointments. The patient expressed understanding and agreed to proceed.   History of Present Illness: Pt is referred to therapy for PTSD (MST) and MDD by the [facility]. Her biggest stressors are her health, taking care of her children (15 & 6) transitioning out of her 12 year relationship. Pt has fibromyalgia and other health issues, which affects her ability to take care of all her family's needs.  She has previous therapy.  Treatment Goals Addressed: Pt will recall the traumatic event without becoming too overwhelmed with emotions 50% of the time, evidenced by self report. Meet with clinician in person weekly for therapy to monitor for progress towards goals and address any barriers to success; Reduce depression from average severity level of 6/10 down to a 4/10 in next 90 days by engaging in 1-2 positive coping skills daily as part of developing self-care routine; Reduce average anxiety level from 7/10 down to 5/10 in next 90 days by utilizing 1-2 relaxation skills/grounding skills per day, such as mindful breathing, progressive muscle relaxation, positive visualizations.  Progression towards Goal: Progressing     Observations/Objective:  Patient participated in a group discussion on emotional regulation skills, helping patient learn to manage her feelings and to better cope with situations.  Patient was encouraged to apply emotional regulation skills and focus on the positive, "which should help with my depression and ptsd symptoms."  Collaboration of Care: Other: None at this  session    Patient/Guardian was advised Release of Information must be obtained prior to any record release in order to collaborate their care with an outside provider.   Patient/Guardian was advised if they have not already done so to contact the registration department to sign all necessary forms in order for [place] to release information regarding their care.   Consent: Patient/Guardian gives verbal consent for treatment and assignment of benefits for services provided during this visit. Patient/Guardian expressed understanding and agreed to proceed.    Assessment and Plan: Counselor will continue to meet with patient to address treatment plan goals. Patient will continue to follow recommendations of providers and implement skills learned in session and practice coping skills between  Sessions.Diagnosis: depression and ptsd     Follow Up Instructions: I discussed the assessment and treatment plan with the patient. The patient was provided an opportunity to ask questions and all were answered. The patient agreed with the plan and demonstrated an understanding of the plan.   The patient was advised to call back or seek an in-person evaluation if the symptoms worsen or if the condition fails to improve as anticipated.   I provided 90 minutes of non-face-to-face time during the group encounter.     Martinique Pizzimenti S, LCAS

## 2023-04-07 ENCOUNTER — Ambulatory Visit (INDEPENDENT_AMBULATORY_CARE_PROVIDER_SITE_OTHER): Payer: Medicaid Other | Admitting: Licensed Clinical Social Worker

## 2023-04-07 DIAGNOSIS — F4310 Post-traumatic stress disorder, unspecified: Secondary | ICD-10-CM | POA: Diagnosis not present

## 2023-04-07 DIAGNOSIS — F331 Major depressive disorder, recurrent, moderate: Secondary | ICD-10-CM

## 2023-04-12 ENCOUNTER — Encounter (HOSPITAL_COMMUNITY): Payer: Self-pay | Admitting: Licensed Clinical Social Worker

## 2023-04-12 NOTE — Progress Notes (Signed)
Virtual Visit via video Note   I connected with Susan Guerra on 03/31/23 at 1:15 pm by video enabled telemedicine application and verified that I am speaking with the correct person using two identifiers.   Location: Patient: home Provider: home office   I discussed the limitations of evaluation and management by telemedicine and the availability of in person appointments. The patient expressed understanding and agreed to proceed.   History of Present Illness: Pt is referred to therapy for PTSD (MST) and MDD by the [facility]. Her biggest stressors are her health, taking care of her children (15 & 6) transitioning out of her 12 year relationship. Pt has fibromyalgia and other health issues, which affects her ability to take care of all her family's needs.  She has previous therapy.  Treatment Goals Addressed: Pt will recall the traumatic event without becoming too overwhelmed with emotions 50% of the time, evidenced by self report. Meet with clinician in person weekly for therapy to monitor for progress towards goals and address any barriers to success; Reduce depression from average severity level of 6/10 down to a 4/10 in next 90 days by engaging in 1-2 positive coping skills daily as part of developing self-care routine; Reduce average anxiety level from 7/10 down to 5/10 in next 90 days by utilizing 1-2 relaxation skills/grounding skills per day, such as mindful breathing, progressive muscle relaxation, positive visualizations.  Progression towards Goal: Progressing      Observations/Objective: Patient presented for today's session on time and was alert, oriented x5, with no evidence or self-report of SI/HI or A/V H. Patient reported ongoing compliance with medication and denied any use of alcohol or illicit substances.  Clinician inquired about patient's current emotional ratings, as well as any significant changes in thoughts, feelings or behavior since previous session. Patient's  emotional ratings: 7/10 for depression, 7/10 for anxiety, 2/10 for anger/irritability, recalling traumatic event without becoming too overwhelmed with emotions 50% of the time. All ratings are evidenced by self report. Cln explored trauma emotional ratings with pt who identified her trauma triggers and coping skills used this past week. Pt provided updates on health, family, x relationship, new relationship, job, SSDI. "Pt reports  "I still continue to not feel well due to stomach issues that a medical provider cannot identify. This has made my depression and trauma symptoms intensified." Pt had her Medication management provider at the [facility] contact me to discuss pt applying for SSDI. Cln and pt reviewed next steps where he and I will assist pt in the application process. Cln validated and normalized patient's feelings about applying for ssdi and not being able to work, while redirecting her focus to things that she can control, such as thought stopping strategies, building trusts again, strengthening coping skills, and working on self. Patient expressed tendency to overanalyze the whys when dealing with things out of her control. Again, touched on the concept of thinking errors and will continue to explore further in upcoming sessions.       Collaboration of Care: Other: Continue with providers at [facility]  Patient/Guardian was advised Release of Information must be obtained prior to any record release in order to collaborate their care with an outside provider.   Patient/Guardian was advised if they have not already done so to contact the registration department to sign all necessary forms in order for [place] to release information regarding their care.   Consent: Patient/Guardian gives verbal consent for treatment and assignment of benefits for services provided during this  visit. Patient/Guardian expressed understanding and agreed to proceed.  Assessment and Plan: Counselor will continue to meet with patient  to address treatment plan goals. Patient will continue to follow recommendations of providers and implement skills learned in session and practice skills between sessions. Diagnosis: MDD and PTSD       Follow Up Instructions: I discussed the assessment and treatment plan with the patient. The patient was provided an opportunity to ask questions and all were answered. The patient agreed with the plan and demonstrated an understanding of the plan.   The patient was advised to call back or seek an in-person evaluation if the symptoms worsen or if the condition fails to improve as anticipated.   I provided 45 minutes of non-face-to-face time during this encounter.     Susan Guerra S, LCAS

## 2023-04-13 ENCOUNTER — Ambulatory Visit (INDEPENDENT_AMBULATORY_CARE_PROVIDER_SITE_OTHER): Payer: No Typology Code available for payment source | Admitting: Licensed Clinical Social Worker

## 2023-04-13 ENCOUNTER — Encounter (HOSPITAL_COMMUNITY): Payer: Self-pay | Admitting: Licensed Clinical Social Worker

## 2023-04-13 DIAGNOSIS — F4310 Post-traumatic stress disorder, unspecified: Secondary | ICD-10-CM

## 2023-04-13 DIAGNOSIS — F331 Major depressive disorder, recurrent, moderate: Secondary | ICD-10-CM | POA: Diagnosis not present

## 2023-04-13 NOTE — Progress Notes (Signed)
Virtual GroupVisit via video Note   I connected with Susan Guerra on 04/13/23 at 5:00 pm during group session by video enabled telemedicine application and verified that I am speaking with the correct person using two identifiers.   Location: Patient: home Provider: home office   I discussed the limitations of evaluation and management by telemedicine and the availability of in person appointments. The patient expressed understanding and agreed to proceed.   History of Present Illness: Pt is referred to therapy for PTSD (MST) and MDD by the [facility]. Her biggest stressors are her health, taking care of her children (15 & 6) transitioning out of her 12 year relationship. Pt has fibromyalgia and other health issues, which affects her ability to take care of all her family's needs.  She has previous therapy.  Treatment Goals Addressed: Pt will recall the traumatic event without becoming too overwhelmed with emotions 50% of the time, evidenced by self report. Meet with clinician in person weekly for therapy to monitor for progress towards goals and address any barriers to success; Reduce depression from average severity level of 6/10 down to a 4/10 in next 90 days by engaging in 1-2 positive coping skills daily as part of developing self-care routine; Reduce average anxiety level from 7/10 down to 5/10 in next 90 days by utilizing 1-2 relaxation skills/grounding skills per day, such as mindful breathing, progressive muscle relaxation, positive visualizations.  Progression towards Goal: Progressing     Observations/Objective:  Pt participated in a group discussion on current stressors. Pt identified her current stressors: being off medications, not feeling well, overwhelmed at work, which led the discussion to manageable coping skills. Pt was encouraged to continue using coping techniques to deal with current stressors.       Collaboration of Care: Other: None at this  session    Patient/Guardian was advised Release of Information must be obtained prior to any record release in order to collaborate their care with an outside provider.   Patient/Guardian was advised if they have not already done so to contact the registration department to sign all necessary forms in order for [place] to release information regarding their care.   Consent: Patient/Guardian gives verbal consent for treatment and assignment of benefits for services provided during this visit. Patient/Guardian expressed understanding and agreed to proceed.    Assessment and Plan: Counselor will continue to meet with patient to address treatment plan goals. Patient will continue to follow recommendations of providers and implement skills learned in session and practice coping skills between  Sessions.Diagnosis: depression and ptsd     Follow Up Instructions: I discussed the assessment and treatment plan with the patient. The patient was provided an opportunity to ask questions and all were answered. The patient agreed with the plan and demonstrated an understanding of the plan.   The patient was advised to call back or seek an in-person evaluation if the symptoms worsen or if the condition fails to improve as anticipated.   I provided 90 minutes of non-face-to-face time during the group encounter.     Jackolyn Geron S, LCAS

## 2023-04-14 ENCOUNTER — Ambulatory Visit (INDEPENDENT_AMBULATORY_CARE_PROVIDER_SITE_OTHER): Payer: No Typology Code available for payment source | Admitting: Licensed Clinical Social Worker

## 2023-04-14 DIAGNOSIS — F4310 Post-traumatic stress disorder, unspecified: Secondary | ICD-10-CM

## 2023-04-14 DIAGNOSIS — F331 Major depressive disorder, recurrent, moderate: Secondary | ICD-10-CM | POA: Diagnosis not present

## 2023-04-19 ENCOUNTER — Encounter (HOSPITAL_COMMUNITY): Payer: Self-pay | Admitting: Licensed Clinical Social Worker

## 2023-04-19 NOTE — Progress Notes (Signed)
Virtual Visit via video Note   I connected with Susan Guerra on 04/07/23 at 1:15 pm by video enabled telemedicine application and verified that I am speaking with the correct person using two identifiers.   Location: Patient: home Provider: home office   I discussed the limitations of evaluation and management by telemedicine and the availability of in person appointments. The patient expressed understanding and agreed to proceed.   History of Present Illness: Pt is referred to therapy for PTSD (MST) and MDD by the [facility]. Her biggest stressors are her health, taking care of her children (15 & 6) transitioning out of her 12 year relationship. Pt has fibromyalgia and other health issues, which affects her ability to take care of all her family's needs.  She has previous therapy.  Treatment Goals Addressed: Pt will recall the traumatic event without becoming too overwhelmed with emotions 50% of the time, evidenced by self report. Meet with clinician in person weekly for therapy to monitor for progress towards goals and address any barriers to success; Reduce depression from average severity level of 6/10 down to a 4/10 in next 90 days by engaging in 1-2 positive coping skills daily as part of developing self-care routine; Reduce average anxiety level from 7/10 down to 5/10 in next 90 days by utilizing 1-2 relaxation skills/grounding skills per day, such as mindful breathing, progressive muscle relaxation, positive visualizations.  Progression towards Goal: Progressing      Observations/Objective: Patient presented for today's session on time and was alert, oriented x5, with no evidence or self-report of SI/HI or A/V H. Patient reported ongoing compliance with medication and denied any use of alcohol or illicit substances.  Clinician inquired about patient's current emotional ratings, as well as any significant changes in thoughts, feelings or behavior since previous session. Patient's  emotional ratings: 7/10 for depression, 7/10 for anxiety, 2/10 for anger/irritability, recalling traumatic event without becoming too overwhelmed with emotions 50% of the time. All ratings are evidenced by self report. Cln explored trauma emotional ratings with pt who identified her trauma triggers and coping skills used this past week. Pt provided updates on health, family, x relationship, new relationship, job, SSDI. "Pt reports  On her stressors this week with the major stressor: running out of medication and having withdrawal symptoms." Cln asked open ended questions and discussed possible solutions so this won't happen in the future. "Of course, this made my depression and trauma symptoms increase." Cln and pt reviewed her coping skills and suggested pt use mindfulness based coping skills, practicing between sessions. Pt reports she has not started the application for SSDI, but she does understand the process, having done this before. "I'm using my thought stopping strategies which is helping me to build trusts again, strengthening my coping skills, and working on self. Pt reports her x relationship partner is having surgery next week so it'll be a stressful week taking care of all the children and the dog with no extra help. Will check in with her next week for updated status.     Collaboration of Care: Other: Continue with providers at [facility]  Patient/Guardian was advised Release of Information must be obtained prior to any record release in order to collaborate their care with an outside provider.   Patient/Guardian was advised if they have not already done so to contact the registration department to sign all necessary forms in order for [place] to release information regarding their care.   Consent: Patient/Guardian gives verbal consent for treatment and  assignment of benefits for services provided during this visit. Patient/Guardian expressed understanding and agreed to proceed.  Assessment and Plan:  Counselor will continue to meet with patient to address treatment plan goals. Patient will continue to follow recommendations of providers and implement skills learned in session and practice skills between sessions. Diagnosis: MDD and PTSD       Follow Up Instructions: I discussed the assessment and treatment plan with the patient. The patient was provided an opportunity to ask questions and all were answered. The patient agreed with the plan and demonstrated an understanding of the plan.   The patient was advised to call back or seek an in-person evaluation if the symptoms worsen or if the condition fails to improve as anticipated.   I provided 45 minutes of non-face-to-face time during this encounter.     Lylian Sanagustin S, LCAS

## 2023-04-20 ENCOUNTER — Ambulatory Visit (HOSPITAL_COMMUNITY): Payer: No Typology Code available for payment source | Admitting: Licensed Clinical Social Worker

## 2023-04-20 ENCOUNTER — Encounter (HOSPITAL_COMMUNITY): Payer: Self-pay | Admitting: Licensed Clinical Social Worker

## 2023-04-20 NOTE — Progress Notes (Signed)
Virtual Visit via video Note   I connected with Susan Guerra on 04/14/23 at 1:15-2 pm by video enabled telemedicine application and verified that I am speaking with the correct person using two identifiers.   Location: Patient: home Provider: home office   I discussed the limitations of evaluation and management by telemedicine and the availability of in person appointments. The patient expressed understanding and agreed to proceed.   History of Present Illness: Pt is referred to therapy for PTSD (MST) and MDD by the [facility]. Her biggest stressors are her health, taking care of her children (15 & 6) transitioning out of her 12 year relationship. Pt has fibromyalgia and other health issues, which affects her ability to take care of all her family'Guerra needs.  She has previous therapy.  Treatment Goals Addressed: Pt will recall the traumatic event without becoming too overwhelmed with emotions 50% of the time, evidenced by self report. Meet with clinician in person weekly for therapy to monitor for progress towards goals and address any barriers to success; Reduce depression from average severity level of 6/10 down to a 4/10 in next 90 days by engaging in 1-2 positive coping skills daily as part of developing self-care routine; Reduce average anxiety level from 7/10 down to 5/10 in next 90 days by utilizing 1-2 relaxation skills/grounding skills per day, such as mindful breathing, progressive muscle relaxation, positive visualizations.  Progression towards Goal: Progressing      Observations/Objective: Patient presented for today'Guerra session on time and was alert, oriented x5, with no evidence or self-report of SI/HI or A/V H. Patient reported ongoing compliance with medication and denied any use of alcohol or illicit substances.  Clinician inquired about patient'Guerra current emotional ratings, as well as any significant changes in thoughts, feelings or behavior since previous session. Patient'Guerra  emotional ratings: 7/10 for depression, 7/10 for anxiety, 2/10 for anger/irritability, recalling traumatic event without becoming too overwhelmed with emotions 50% of the time. All ratings are evidenced by self report. Cln explored trauma emotional ratings with pt who identified her trauma triggers and coping skills used this past week. Pt provided updates on health, family, x relationship, new relationship, job, SSDI. "Pt reports  On her stressors this week with the major stressor: caring for all of her children and the dog while her x relationship has surgery. "This is a struggle for me especially not having any help." Cln and pt reviewed who could help her during this time even when her health is chronically not good."Of course, this made my depression and trauma symptoms increase." Cln and pt reviewed her coping skills and suggested pt use mindfulness based coping and grounding skills, practicing between sessions. Pt reports she still has not started the application for SSDI, but she does understand the process, having done this before. "I just have too much I'm dealing with now to begin the process." Cln will continue to encourage pt to begin the process when things in her life return to normal.  Cln used CBT to assist in identifying positives and negatives to assess how her mood has been impacted. Clinician allowed space for patient to further process emotions and clinician provided supportive statements throughout session. It was suggested to patient to continue to identify things that  impact mood daily. Cln provided education on how to to recognize the physical, cognitive, emotional, and behavioral responses that influence her mood. Pt was receptive to intervention.   Collaboration of Care: Other: Continue with providers at [facility]  Patient/Guardian was  advised Release of Information must be obtained prior to any record release in order to collaborate their care with an outside provider.    Patient/Guardian was advised if they have not already done so to contact the registration department to sign all necessary forms in order for [place] to release information regarding their care.   Consent: Patient/Guardian gives verbal consent for treatment and assignment of benefits for services provided during this visit. Patient/Guardian expressed understanding and agreed to proceed.  Assessment and Plan: Counselor will continue to meet with patient to address treatment plan goals. Patient will continue to follow recommendations of providers and implement skills learned in session and practice skills between sessions. Diagnosis: MDD and PTSD       Follow Up Instructions: I discussed the assessment and treatment plan with the patient. The patient was provided an opportunity to ask questions and all were answered. The patient agreed with the plan and demonstrated an understanding of the plan.   The patient was advised to call back or seek an in-person evaluation if the symptoms worsen or if the condition fails to improve as anticipated.   I provided 45 minutes of non-face-to-face time during this encounter.     Susan Guerra, LCAS

## 2023-04-21 ENCOUNTER — Ambulatory Visit (HOSPITAL_COMMUNITY): Payer: No Typology Code available for payment source | Admitting: Licensed Clinical Social Worker

## 2023-04-21 ENCOUNTER — Encounter (HOSPITAL_COMMUNITY): Payer: Self-pay

## 2023-04-27 ENCOUNTER — Ambulatory Visit (HOSPITAL_COMMUNITY): Payer: No Typology Code available for payment source | Admitting: Licensed Clinical Social Worker

## 2023-04-28 ENCOUNTER — Ambulatory Visit (HOSPITAL_COMMUNITY): Payer: No Typology Code available for payment source | Admitting: Licensed Clinical Social Worker

## 2023-05-04 ENCOUNTER — Ambulatory Visit (HOSPITAL_COMMUNITY): Payer: No Typology Code available for payment source | Admitting: Licensed Clinical Social Worker

## 2023-05-04 ENCOUNTER — Encounter (HOSPITAL_COMMUNITY): Payer: Self-pay

## 2023-05-05 ENCOUNTER — Ambulatory Visit (HOSPITAL_COMMUNITY): Payer: No Typology Code available for payment source | Admitting: Licensed Clinical Social Worker

## 2023-05-06 ENCOUNTER — Ambulatory Visit
Admission: EM | Admit: 2023-05-06 | Discharge: 2023-05-06 | Disposition: A | Discharge status: Home / Self Care | Payer: No Typology Code available for payment source | Attending: Physician Assistant | Admitting: Physician Assistant

## 2023-05-06 ENCOUNTER — Ambulatory Visit (HOSPITAL_COMMUNITY): Payer: No Typology Code available for payment source | Admitting: Licensed Clinical Social Worker

## 2023-05-06 DIAGNOSIS — Z1152 Encounter for screening for COVID-19: Secondary | ICD-10-CM | POA: Diagnosis not present

## 2023-05-06 DIAGNOSIS — F331 Major depressive disorder, recurrent, moderate: Secondary | ICD-10-CM

## 2023-05-06 DIAGNOSIS — J029 Acute pharyngitis, unspecified: Secondary | ICD-10-CM | POA: Insufficient documentation

## 2023-05-06 DIAGNOSIS — J069 Acute upper respiratory infection, unspecified: Secondary | ICD-10-CM | POA: Diagnosis present

## 2023-05-06 DIAGNOSIS — R0981 Nasal congestion: Secondary | ICD-10-CM | POA: Diagnosis present

## 2023-05-06 DIAGNOSIS — F4310 Post-traumatic stress disorder, unspecified: Secondary | ICD-10-CM

## 2023-05-06 LAB — SARS CORONAVIRUS 2 BY RT PCR: SARS Coronavirus 2 by RT PCR: NEGATIVE

## 2023-05-06 LAB — GROUP A STREP BY PCR: Group A Strep by PCR: NOT DETECTED

## 2023-05-06 MED ORDER — PROMETHAZINE-DM 6.25-15 MG/5ML PO SYRP: 5.0000 mL | ORAL_SOLUTION | Freq: Four times a day (QID) | ORAL | 0 refills | Status: AC | PRN

## 2023-05-06 MED ORDER — IPRATROPIUM BROMIDE 0.06 % NA SOLN: 2.0000 | Freq: Four times a day (QID) | NASAL | 0 refills | Status: AC

## 2023-05-06 NOTE — ED Triage Notes (Signed)
Pt c/o HA,congestion,sore throat & fatigue x1 day. Denies any fevers. Has tried alka seltzer w/o relief.

## 2023-05-06 NOTE — ED Provider Notes (Signed)
[facility]    CSN: [id number] Arrival date & time: 05/06/23  1628      History   Chief Complaint Chief Complaint  Patient presents with   Sore Throat   Headache   Fatigue    HPI Susan Guerra is a 47 y.o. female presenting for onset of fatigue, headaches, congestion, sore throat yesterday.  Denies fever, cough, shortness of breath, vomiting or diarrhea.  Reports that she recently on a cruise with her children who are also ill.  Her daughter was seen yesterday and tested negative on the respiratory panel.  Patient has been taking Alka-Seltzer cold plus over-the-counter.  No other complaints.  HPI  Past Medical History:  Diagnosis Date   GAD (generalized anxiety disorder)    Tremors of nervous system     Patient Active Problem List   Diagnosis Date Noted   PTSD (post-traumatic stress disorder) 12/10/2020   MDD (major depressive disorder) 12/10/2020   Atypical migraine 03/04/2020    Past Surgical History:  Procedure Laterality Date   ABDOMINAL HYSTERECTOMY     HIP SURGERY     shoulder  suregery      OB History   No obstetric history on file.      Home Medications    Prior to Admission medications   Medication Sig Start Date End Date Taking? Authorizing Provider  AJOVY 225 MG/1.5ML SOSY Inject 1.5 mLs into the skin every 30 (thirty) days.  01/10/20  Yes [provider]  albuterol (PROVENTIL HFA) 108 (90 Base) MCG/ACT inhaler every 4 (four) hours as needed. 05/18/12  Yes [provider]  ASMANEX, 60 METERED DOSES, 220 MCG/INH inhaler Inhale 2 puffs into the lungs at bedtime.  11/01/18  Yes [provider]  cetirizine (ZYRTEC) 10 MG tablet Take 10 mg by mouth at bedtime as needed.  09/28/18  Yes [provider]  Cholecalciferol (VITAMIN D3 PO) Take 2 tablets by mouth daily.   Yes [provider]  clonazePAM (KLONOPIN) 2 MG tablet    Yes   cyanocobalamin (VITAMIN B12) 1000 MCG tablet Take by mouth. 12/05/22  Yes  [provider]  dicyclomine (BENTYL) 20 MG tablet Take 1 tablet (20 mg total) by mouth 2 (two) times daily. 03/12/23  Yes White, Adrienne R, NP  DULoxetine (CYMBALTA) 30 MG capsule Take 90 mg by mouth daily.  06/21/18  Yes [provider]  ferrous sulfate 325 (65 FE) MG EC tablet Take 325 mg by mouth daily.   Yes [provider]  ipratropium (ATROVENT) 0.06 % nasal spray Place 2 sprays into both nostrils 4 (four) times daily. 05/06/23  Yes Shirlee Latch, PA-C  levonorgestrel (MIRENA) 20 MCG/24HR IUD by Intrauterine route.   Yes [provider]  loperamide (IMODIUM) 2 MG capsule Take 1 capsule (2 mg total) by mouth 4 (four) times daily as needed for diarrhea or loose stools. 03/12/23  Yes White, Adrienne R, NP  modafinil (PROVIGIL) 200 MG tablet Take 200 mg by mouth daily.  09/28/18  Yes [provider]  Multiple Vitamin (MULTIVITAMIN WITH MINERALS) TABS tablet Take 1 tablet by mouth daily.   Yes [provider]  naproxen (NAPROSYN) 500 MG tablet Take 1 tablet (500 mg total) by mouth 2 (two) times daily with a meal. 12/01/22  Yes Brimage, Vondra, DO  ondansetron (ZOFRAN-ODT) 8 MG disintegrating tablet Take 1 tablet (8 mg total) by mouth every 8 (eight) hours as needed for nausea or vomiting. 03/12/23  Yes Valinda Hoar, NP  promethazine-dextromethorphan (PROMETHAZINE-DM) 6.25-15 MG/5ML syrup Take 5 mLs by mouth 4 (four) times daily as needed. 05/06/23  Yes Eusebio Friendly B, PA-C  senna-docusate (SENOKOT-S) 8.6-50 MG tablet Take 2 tablets by mouth 2 (two) times daily. 01/03/22  Yes Sharman Cheek, MD  SUMAtriptan (IMITREX) 100 MG tablet Take 100 mg by mouth every 2 (two) hours as needed.  10/11/15  Yes [provider]  tiZANidine (ZANAFLEX) 2 MG tablet Take 1 tablet (2 mg total) by mouth every 6 (six) hours as needed for muscle spasms. 12/01/22  Yes Brimage, Vondra, DO  topiramate (TOPAMAX) 25 MG tablet TAKE THREE TABLETS BY MOUTH ONCE EVERY  DAY FOR TREMOR 07/22/22 07/23/23 Yes [provider]  traMADol (ULTRAM) 50 MG tablet Take 1 tablet (50 mg total) by mouth every 6 (six) hours as needed. 01/03/22  Yes Sharman Cheek, MD  traZODone (DESYREL) 100 MG tablet Take 100 mg by mouth at bedtime.  11/01/18  Yes [provider]  zolmitriptan (ZOMIG) 5 MG tablet Take 5 mg by mouth every 2 (two) hours as needed.  12/31/18  Yes [provider]  Armodafinil 50 MG tablet Take  1 tablet in QAM for  2 days; if tolerated , take  2 tablets in AM. Patient not taking: No sig reported 08/04/17   [provider]  divalproex (DEPAKOTE ER) 500 MG 24 hr tablet  12/31/18   [provider]  polyethylene glycol powder (MIRALAX) 17 GM/SCOOP powder 1 scoop (17 grams) daily as needed for constipation. Patient not taking: Reported on 03/04/2020 06/30/19   Verlee Monte, NP  pregabalin (LYRICA) 100 MG capsule  09/28/18   [provider]  zonisamide (ZONEGRAN) 100 MG capsule Take by mouth. Patient not taking: Reported on 03/04/2020 07/17/15   [provider]    Family History Family History  Problem Relation Age of Onset   Diabetes Mother    Heart failure Mother    Hypertension Mother    Cancer Father    Heart failure Father    Alzheimer's disease Father     Social History Social History   Tobacco Use   Smoking status: Never   Smokeless tobacco: Never  Vaping Use   Vaping status: Never Used  Substance Use Topics   Alcohol use: Not Currently   Drug use: Never     Allergies   Aspirin, Latex, Buspirone, Acetaminophen, Darunavir, Pseudoephedrine-acetaminophen, Sulfa antibiotics, and Zolpidem   Review of Systems Review of Systems  Constitutional:  Positive for fatigue. Negative for chills, diaphoresis and fever.  HENT:  Positive for congestion, rhinorrhea and sore throat. Negative for ear pain, sinus pressure and sinus pain.   Respiratory:  Negative for cough and shortness of breath.    Gastrointestinal:  Negative for abdominal pain, nausea and vomiting.  Musculoskeletal:  Negative for arthralgias and myalgias.  Skin:  Negative for rash.  Neurological:  Positive for headaches. Negative for weakness.  Hematological:  Negative for adenopathy.     Physical Exam Triage Vital Signs ED Triage Vitals  Encounter Vitals Group     BP 05/06/23 1701 126/87     Systolic BP Percentile --      Diastolic BP Percentile --      Pulse Rate 05/06/23 1701 70     Resp 05/06/23 1701 16     Temp 05/06/23 1701 99 F (37.2 C)     Temp Source 05/06/23 1701 Oral     SpO2 05/06/23 1701 99 %     Weight 05/06/23 1700  125 lb (56.7 kg)     Height 05/06/23 1700 5' 5.5" (1.664 m)     Head Circumference --      Peak Flow --      Pain Score 05/06/23 1705 3     Pain Loc --      Pain Education --      Exclude from Growth Chart --    No data found.  Updated Vital Signs BP 126/87 (BP Location: Left Arm)   Pulse 70   Temp 99 F (37.2 C) (Oral)   Resp 16   Ht 5' 5.5" (1.664 m)   Wt 125 lb (56.7 kg)   LMP 06/20/2019 Comment: signed preg waiver  SpO2 99%   BMI 20.48 kg/m   Physical Exam Vitals and nursing note reviewed.  Constitutional:      General: She is not in acute distress.    Appearance: Normal appearance. She is well-developed. She is ill-appearing. She is not toxic-appearing.  HENT:     Head: Normocephalic and atraumatic.     Nose: Congestion present.     Mouth/Throat:     Mouth: Mucous membranes are moist.     Pharynx: Oropharynx is clear. Posterior oropharyngeal erythema present.  Eyes:     General: No scleral icterus.       Right eye: No discharge.        Left eye: No discharge.     Conjunctiva/sclera: Conjunctivae normal.  Cardiovascular:     Rate and Rhythm: Normal rate and regular rhythm.     Heart sounds: Normal heart sounds.  Pulmonary:     Effort: Pulmonary effort is normal. No respiratory distress.     Breath sounds: Normal breath sounds.  Musculoskeletal:      Cervical back: Neck supple.  Skin:    General: Skin is dry.  Neurological:     General: No focal deficit present.     Mental Status: She is alert. Mental status is at baseline.     Motor: No weakness.     Gait: Gait normal.  Psychiatric:        Mood and Affect: Mood normal.        Behavior: Behavior normal.        Thought Content: Thought content normal.      UC Treatments / Results  Labs (all labs ordered are listed, but only abnormal results are displayed) Labs Reviewed  GROUP A STREP BY PCR  SARS CORONAVIRUS 2 BY RT PCR    EKG   Radiology No results found.  Procedures Procedures (including critical care time)  Medications Ordered in UC Medications - No data to display  Initial Impression / Assessment and Plan / UC Course  I have reviewed the triage vital signs and the nursing notes.  Pertinent labs & imaging results that were available during my care of the patient were reviewed by me and considered in my medical decision making (see chart for details).   48 year old female presents for sore throat, fatigue, congestion that began yesterday.  Recently returned from a cruise a couple days ago.  Children are also ill.  She has been taking OTC meds.  Vitals are normal and stable.  She is ill-appearing but nontoxic.  On exam is nasal congestion and erythema posterior pharynx.  Chest is clear.  Strep test and COVID test obtained. All negative.   Viral URI. Supportive care. Sent promethazine DM and Atrovent nasal spray. Reviewed return precautions. School note.    Final Clinical Impressions(s) /  UC Diagnoses   Final diagnoses:  Viral upper respiratory tract infection  Sore throat  Nasal congestion     Discharge Instructions      URI/COLD SYMPTOMS: Your exam today is consistent with a viral illness. Antibiotics are not indicated at this time. Use medications as directed, including cough syrup, nasal saline, and decongestants. Your symptoms should improve  over the next few days and resolve within 7-10 days. Increase rest and fluids. F/u if symptoms worsen or predominate such as sore throat, ear pain, productive cough, shortness of breath, or if you develop high fevers or worsening fatigue over the next several days.       ED Prescriptions     Medication Sig Dispense Auth. Provider   promethazine-dextromethorphan (PROMETHAZINE-DM) 6.25-15 MG/5ML syrup Take 5 mLs by mouth 4 (four) times daily as needed. 118 mL Eusebio Friendly B, PA-C   ipratropium (ATROVENT) 0.06 % nasal spray Place 2 sprays into both nostrils 4 (four) times daily. 15 mL Shirlee Latch, PA-C      PDMP not reviewed this encounter.   Shirlee Latch, PA-C 05/06/23 1825

## 2023-05-06 NOTE — Discharge Instructions (Signed)
URI/COLD SYMPTOMS: Your exam today is consistent with a viral illness. Antibiotics are not indicated at this time. Use medications as directed, including cough syrup, nasal saline, and decongestants. Your symptoms should improve over the next few days and resolve within 7-10 days. Increase rest and fluids. F/u if symptoms worsen or predominate such as sore throat, ear pain, productive cough, shortness of breath, or if you develop high fevers or worsening fatigue over the next several days.    

## 2023-05-07 ENCOUNTER — Encounter (HOSPITAL_COMMUNITY): Payer: Self-pay | Admitting: Licensed Clinical Social Worker

## 2023-05-07 NOTE — Progress Notes (Signed)
Virtual Visit via video Note   I connected with Susan Guerra on 05/06/23 at 2-2:45 pm by video enabled telemedicine application and verified that I am speaking with the correct person using two identifiers.   Location: Patient: home Provider: home office   I discussed the limitations of evaluation and management by telemedicine and the availability of in person appointments. The patient expressed understanding and agreed to proceed.   History of Present Illness: Pt is referred to therapy for PTSD (MST) and MDD by the [facility]. Her biggest stressors are her health, taking care of her children (15 & 6) transitioning out of her 12 year relationship. Pt has fibromyalgia and other health issues, which affects her ability to take care of all her family's needs.  She has previous therapy.  Treatment Goals Addressed: Pt will recall the traumatic event without becoming too overwhelmed with emotions 50% of the time, evidenced by self report. Meet with clinician in person weekly for therapy to monitor for progress towards goals and address any barriers to success; Reduce depression from average severity level of 6/10 down to a 4/10 in next 90 days by engaging in 1-2 positive coping skills daily as part of developing self-care routine; Reduce average anxiety level from 7/10 down to 5/10 in next 90 days by utilizing 1-2 relaxation skills/grounding skills per day, such as mindful breathing, progressive muscle relaxation, positive visualizations.  Progression towards Goal: Progressing      Observations/Objective: Patient presented for today's session on time and was alert, oriented x5, with no evidence or self-report of SI/HI or A/V H. Patient reported ongoing compliance with medication and denied any use of alcohol or illicit substances.  Clinician inquired about patient's current emotional ratings, as well as any significant changes in thoughts, feelings or behavior since previous session. Patient's  emotional ratings: 7/10 for depression, 7/10 for anxiety, 2/10 for anger/irritability, recalling traumatic event without becoming too overwhelmed with emotions 50% of the time. All ratings are evidenced by self report. Cln explored trauma emotional ratings with pt who identified her trauma triggers and coping skills used this past week. Pt provided updates on health, family, x relationship, new relationship, job, Sport and exercise psychologist. Pt was lying on the sofa sick and exhausted having just returned from a family cruise with her siblings and her 3 children. "All of [place] are sick in my family and I'm exhausted from the cruise." Cln asked open ended questions. Clinician utilized CBT to process thoughts, feelings, and behaviors. Clinician noted the challenge in being productive, working, and feeling positive when physical health is poor. Clinician provided supportive feedback.      Collaboration of Care: Other: Continue with providers at [facility]  Patient/Guardian was advised Release of Information must be obtained prior to any record release in order to collaborate their care with an outside provider.   Patient/Guardian was advised if they have not already done so to contact the registration department to sign all necessary forms in order for [place] to release information regarding their care.   Consent: Patient/Guardian gives verbal consent for treatment and assignment of benefits for services provided during this visit. Patient/Guardian expressed understanding and agreed to proceed.  Assessment and Plan: Counselor will continue to meet with patient to address treatment plan goals. Patient will continue to follow recommendations of providers and implement skills learned in session and practice skills between sessions. Diagnosis: MDD and PTSD       Follow Up Instructions: I discussed the assessment and treatment plan with the patient.  The patient was provided an opportunity to ask questions and all were answered. The patient  agreed with the plan and demonstrated an understanding of the plan.   The patient was advised to call back or seek an in-person evaluation if the symptoms worsen or if the condition fails to improve as anticipated.   I provided 45 minutes of non-face-to-face time during this encounter.     Natallie Ravenscroft S, LCAS

## 2023-05-11 ENCOUNTER — Ambulatory Visit (INDEPENDENT_AMBULATORY_CARE_PROVIDER_SITE_OTHER): Payer: No Typology Code available for payment source | Admitting: Licensed Clinical Social Worker

## 2023-05-11 ENCOUNTER — Encounter (HOSPITAL_COMMUNITY): Payer: Self-pay | Admitting: Licensed Clinical Social Worker

## 2023-05-11 DIAGNOSIS — F331 Major depressive disorder, recurrent, moderate: Secondary | ICD-10-CM

## 2023-05-11 DIAGNOSIS — F4310 Post-traumatic stress disorder, unspecified: Secondary | ICD-10-CM

## 2023-05-11 NOTE — Progress Notes (Signed)
Virtual GroupVisit via video Note   I connected with Susan Guerra on 05/11/23 at 5:00 pm during group session by video enabled telemedicine application and verified that I am speaking with the correct person using two identifiers.   Location: Patient: home Provider: home office   I discussed the limitations of evaluation and management by telemedicine and the availability of in person appointments. The patient expressed understanding and agreed to proceed.   History of Present Illness: Pt is referred to therapy for PTSD (MST) and MDD by the [facility]. Her biggest stressors are her health, taking care of her children (15 & 6) transitioning out of her 12 year relationship. Pt has fibromyalgia and other health issues, which affects her ability to take care of all her family's needs.  She has previous therapy.  Treatment Goals Addressed: Pt will recall the traumatic event without becoming too overwhelmed with emotions 50% of the time, evidenced by self report. Meet with clinician in person weekly for therapy to monitor for progress towards goals and address any barriers to success; Reduce depression from average severity level of 6/10 down to a 4/10 in next 90 days by engaging in 1-2 positive coping skills daily as part of developing self-care routine; Reduce average anxiety level from 7/10 down to 5/10 in next 90 days by utilizing 1-2 relaxation skills/grounding skills per day, such as mindful breathing, progressive muscle relaxation, positive visualizations.  Progression towards Goal: Progressing     Observations/Objective:  Pt participated in a group discussion on taking care of basic needs during high stress. Pt was able to identify areas she tends to neglect during stressful situations. During the discussion, Pt realized when basic needs are neglected mental well-being may deteriorate and which contributes to additional stress.     Collaboration of Care: Other: None at this  session    Patient/Guardian was advised Release of Information must be obtained prior to any record release in order to collaborate their care with an outside provider.   Patient/Guardian was advised if they have not already done so to contact the registration department to sign all necessary forms in order for [place] to release information regarding their care.   Consent: Patient/Guardian gives verbal consent for treatment and assignment of benefits for services provided during this visit. Patient/Guardian expressed understanding and agreed to proceed.    Assessment and Plan: Counselor will continue to meet with patient to address treatment plan goals. Patient will continue to follow recommendations of providers and implement skills learned in session and practice coping skills between  Sessions.Diagnosis: depression and ptsd     Follow Up Instructions: I discussed the assessment and treatment plan with the patient. The patient was provided an opportunity to ask questions and all were answered. The patient agreed with the plan and demonstrated an understanding of the plan.   The patient was advised to call back or seek an in-person evaluation if the symptoms worsen or if the condition fails to improve as anticipated.   I provided 90 minutes of non-face-to-face time during the group encounter.     Susan Guerra S, LCAS

## 2023-05-12 ENCOUNTER — Ambulatory Visit (INDEPENDENT_AMBULATORY_CARE_PROVIDER_SITE_OTHER): Payer: No Typology Code available for payment source | Admitting: Licensed Clinical Social Worker

## 2023-05-12 DIAGNOSIS — F331 Major depressive disorder, recurrent, moderate: Secondary | ICD-10-CM | POA: Diagnosis not present

## 2023-05-12 DIAGNOSIS — F4310 Post-traumatic stress disorder, unspecified: Secondary | ICD-10-CM

## 2023-05-17 ENCOUNTER — Encounter (HOSPITAL_COMMUNITY): Payer: Self-pay | Admitting: Licensed Clinical Social Worker

## 2023-05-17 NOTE — Progress Notes (Signed)
Virtual Visit via video Note   I connected with Susan Guerra on 05/12/23 at 1:15-2pm by video enabled telemedicine application and verified that I am speaking with the correct person using two identifiers.   Location: Patient: home Provider: home office   I discussed the limitations of evaluation and management by telemedicine and the availability of in person appointments. The patient expressed understanding and agreed to proceed.   History of Present Illness: Pt is referred to therapy for PTSD (MST) and MDD by the [facility]. Her biggest stressors are her health, taking care of her children (15 & 6) transitioning out of her 12 year relationship. Pt has fibromyalgia and other health issues, which affects her ability to take care of all her family's needs.  She has previous therapy.  Treatment Goals Addressed: Pt will recall the traumatic event without becoming too overwhelmed with emotions 50% of the time, evidenced by self report. Meet with clinician in person weekly for therapy to monitor for progress towards goals and address any barriers to success; Reduce depression from average severity level of 6/10 down to a 4/10 in next 90 days by engaging in 1-2 positive coping skills daily as part of developing self-care routine; Reduce average anxiety level from 7/10 down to 5/10 in next 90 days by utilizing 1-2 relaxation skills/grounding skills per day, such as mindful breathing, progressive muscle relaxation, positive visualizations.  Progression towards Goal: Progressing      Observations/Objective: Patient presented for today's session on time and was alert, oriented x5, with no evidence or self-report of SI/HI or A/V H. Patient reported ongoing compliance with medication and denied any use of alcohol or illicit substances.  Clinician inquired about patient's current emotional ratings, as well as any significant changes in thoughts, feelings or behavior since previous session. Patient's  emotional ratings: 7/10 for depression, 7/10 for anxiety, 2/10 for anger/irritability, recalling traumatic event without becoming too overwhelmed with emotions 50% of the time. All ratings are evidenced by self report. Cln explored trauma emotional ratings with pt who identified her trauma triggers and coping skills used this past week. Pt provided updates on health, family, x relationship, new relationship, job, Sport and exercise psychologist. Marland Kitchen We discussed the impact these stressors have had on pt as well as how she copes with them. Cln validated pt feelings, noting the cumulative effect of multiple concurrent stressors. We worked to identify strategies pt might use to reduce stress. Also encouraged pt to make sure she is taking time for self-care and seeking support when needed.        Collaboration of Care: Other: Continue with providers at [facility]  Patient/Guardian was advised Release of Information must be obtained prior to any record release in order to collaborate their care with an outside provider.   Patient/Guardian was advised if they have not already done so to contact the registration department to sign all necessary forms in order for [place] to release information regarding their care.   Consent: Patient/Guardian gives verbal consent for treatment and assignment of benefits for services provided during this visit. Patient/Guardian expressed understanding and agreed to proceed.  Assessment and Plan: Counselor will continue to meet with patient to address treatment plan goals. Patient will continue to follow recommendations of providers and implement skills learned in session and practice skills between sessions. Diagnosis: MDD and PTSD       Follow Up Instructions: I discussed the assessment and treatment plan with the patient. The patient was provided an opportunity to ask questions and all  were answered. The patient agreed with the plan and demonstrated an understanding of the plan.   The patient was advised to  call back or seek an in-person evaluation if the symptoms worsen or if the condition fails to improve as anticipated.   I provided 45 minutes of non-face-to-face time during this encounter.     Wilfredo Canterbury S, LCAS

## 2023-05-18 ENCOUNTER — Encounter (HOSPITAL_COMMUNITY): Payer: Self-pay | Admitting: Licensed Clinical Social Worker

## 2023-05-18 ENCOUNTER — Ambulatory Visit (INDEPENDENT_AMBULATORY_CARE_PROVIDER_SITE_OTHER): Payer: No Typology Code available for payment source | Admitting: Licensed Clinical Social Worker

## 2023-05-18 DIAGNOSIS — F4310 Post-traumatic stress disorder, unspecified: Secondary | ICD-10-CM | POA: Diagnosis not present

## 2023-05-18 DIAGNOSIS — F331 Major depressive disorder, recurrent, moderate: Secondary | ICD-10-CM

## 2023-05-18 NOTE — Progress Notes (Signed)
Virtual GroupVisit via video Note   I connected with Susan Guerra on 05/18/23 at 5:00 pm during group session by video enabled telemedicine application and verified that I am speaking with the correct person using two identifiers.   Location: Patient: home Provider: home office   I discussed the limitations of evaluation and management by telemedicine and the availability of in person appointments. The patient expressed understanding and agreed to proceed.   History of Present Illness: Pt is referred to therapy for PTSD (MST) and MDD by the [facility]. Her biggest stressors are her health, taking care of her children (15 & 6) transitioning out of her 12 year relationship. Pt has fibromyalgia and other health issues, which affects her ability to take care of all her family's needs.  She has previous therapy.  Treatment Goals Addressed: Pt will recall the traumatic event without becoming too overwhelmed with emotions 50% of the time, evidenced by self report. Meet with clinician in person weekly for therapy to monitor for progress towards goals and address any barriers to success; Reduce depression from average severity level of 6/10 down to a 4/10 in next 90 days by engaging in 1-2 positive coping skills daily as part of developing self-care routine; Reduce average anxiety level from 7/10 down to 5/10 in next 90 days by utilizing 1-2 relaxation skills/grounding skills per day, such as mindful breathing, progressive muscle relaxation, positive visualizations.  Progression towards Goal: Progressing     Observations/Objective:  Pt participated in a group discussion on childhood memories, it's effects today, how it affects mental health, choices in life and outcomes in adulthood. It was suggested to pt to continue working on mental health issues and use coping techniques.       Collaboration of Care: Other: None at this session    Patient/Guardian was advised Release of Information  must be obtained prior to any record release in order to collaborate their care with an outside provider.   Patient/Guardian was advised if they have not already done so to contact the registration department to sign all necessary forms in order for [place] to release information regarding their care.   Consent: Patient/Guardian gives verbal consent for treatment and assignment of benefits for services provided during this visit. Patient/Guardian expressed understanding and agreed to proceed.    Assessment and Plan: Counselor will continue to meet with patient to address treatment plan goals. Patient will continue to follow recommendations of providers and implement skills learned in session and practice coping skills between  Sessions.Diagnosis: depression and ptsd     Follow Up Instructions: I discussed the assessment and treatment plan with the patient. The patient was provided an opportunity to ask questions and all were answered. The patient agreed with the plan and demonstrated an understanding of the plan.   The patient was advised to call back or seek an in-person evaluation if the symptoms worsen or if the condition fails to improve as anticipated.   I provided 90 minutes of non-face-to-face time during the group encounter.     Adrian Specht S, LCAS

## 2023-05-19 ENCOUNTER — Ambulatory Visit (INDEPENDENT_AMBULATORY_CARE_PROVIDER_SITE_OTHER): Payer: No Typology Code available for payment source | Admitting: Licensed Clinical Social Worker

## 2023-05-19 ENCOUNTER — Encounter (HOSPITAL_COMMUNITY): Payer: Self-pay | Admitting: Licensed Clinical Social Worker

## 2023-05-19 DIAGNOSIS — F331 Major depressive disorder, recurrent, moderate: Secondary | ICD-10-CM

## 2023-05-19 DIAGNOSIS — F4310 Post-traumatic stress disorder, unspecified: Secondary | ICD-10-CM

## 2023-05-19 NOTE — Progress Notes (Unsigned)
Virtual Visit via video Note   I connected with Susan Guerra on 05/19/23 at 1:15-2pm by video enabled telemedicine application and verified that I am speaking with the correct person using two identifiers.   Location: Patient: home Provider: home office   I discussed the limitations of evaluation and management by telemedicine and the availability of in person appointments. The patient expressed understanding and agreed to proceed.   History of Present Illness: Pt is referred to therapy for PTSD (MST) and MDD by the [facility]. Her biggest stressors are her health, taking care of her children (15 & 6) transitioning out of her 12 year relationship. Pt has fibromyalgia and other health issues, which affects her ability to take care of all her family's needs.  She has previous therapy.  Treatment Goals Addressed: Pt will recall the traumatic event without becoming too overwhelmed with emotions 50% of the time, evidenced by self report. Meet with clinician in person weekly for therapy to monitor for progress towards goals and address any barriers to success; Reduce depression from average severity level of 6/10 down to a 4/10 in next 90 days by engaging in 1-2 positive coping skills daily as part of developing self-care routine; Reduce average anxiety level from 7/10 down to 5/10 in next 90 days by utilizing 1-2 relaxation skills/grounding skills per day, such as mindful breathing, progressive muscle relaxation, positive visualizations.  Progression towards Goal: Progressing      Observations/Objective: Patient presented for today's session on time and was alert, oriented x5, with no evidence or self-report of SI/HI or A/V H. Patient reported ongoing compliance with medication and denied any use of alcohol or illicit substances.  Clinician inquired about patient's current emotional ratings, as well as any significant changes in thoughts, feelings or behavior since previous session. Patient's  emotional ratings: 7/10 for depression, 7/10 for anxiety, 2/10 for anger/irritability, recalling traumatic event without becoming too overwhelmed with emotions 50% of the time. All ratings are evidenced by self report. Cln explored trauma emotional ratings with pt who identified her trauma triggers and coping skills used this past week. Pt provided updates on health, family, x relationship, new relationship, job, Sport and exercise psychologist. Marland Kitchen We discussed the impact these stressors have had on pt as well as how she copes with them. Cln validated pt feelings, noting the cumulative effect of multiple concurrent stressors. We worked to identify strategies pt might use to reduce stress. Also encouraged pt to make sure she is taking time for self-care and seeking support when needed.        Collaboration of Care: Other: Continue with providers at [facility]  Patient/Guardian was advised Release of Information must be obtained prior to any record release in order to collaborate their care with an outside provider.   Patient/Guardian was advised if they have not already done so to contact the registration department to sign all necessary forms in order for [place] to release information regarding their care.   Consent: Patient/Guardian gives verbal consent for treatment and assignment of benefits for services provided during this visit. Patient/Guardian expressed understanding and agreed to proceed.  Assessment and Plan: Counselor will continue to meet with patient to address treatment plan goals. Patient will continue to follow recommendations of providers and implement skills learned in session and practice skills between sessions. Diagnosis: MDD and PTSD       Follow Up Instructions: I discussed the assessment and treatment plan with the patient. The patient was provided an opportunity to ask questions and all  were answered. The patient agreed with the plan and demonstrated an understanding of the plan.   The patient was advised to  call back or seek an in-person evaluation if the symptoms worsen or if the condition fails to improve as anticipated.   I provided 45 minutes of non-face-to-face time during this encounter.     Susan Guerra S, LCAS

## 2023-05-25 ENCOUNTER — Encounter (HOSPITAL_COMMUNITY): Payer: Self-pay | Admitting: Licensed Clinical Social Worker

## 2023-05-25 ENCOUNTER — Ambulatory Visit (INDEPENDENT_AMBULATORY_CARE_PROVIDER_SITE_OTHER): Payer: No Typology Code available for payment source | Admitting: Licensed Clinical Social Worker

## 2023-05-25 DIAGNOSIS — F4310 Post-traumatic stress disorder, unspecified: Secondary | ICD-10-CM

## 2023-05-25 DIAGNOSIS — F331 Major depressive disorder, recurrent, moderate: Secondary | ICD-10-CM

## 2023-05-25 NOTE — Progress Notes (Signed)
Virtual GroupVisit via video Note   I connected with Susan Guerra on 05/25/23 at 5:00 pm during group session by video enabled telemedicine application and verified that I am speaking with the correct person using two identifiers.   Location: Patient: home Provider: home office   I discussed the limitations of evaluation and management by telemedicine and the availability of in person appointments. The patient expressed understanding and agreed to proceed.   History of Present Illness: Pt is referred to therapy for PTSD (MST) and MDD by the [facility]. Her biggest stressors are her health, taking care of her children (15 & 6) transitioning out of her 12 year relationship. Pt has fibromyalgia and other health issues, which affects her ability to take care of all her family's needs.  She has previous therapy.  Treatment Goals Addressed: Pt will recall the traumatic event without becoming too overwhelmed with emotions 50% of the time, evidenced by self report. Meet with clinician in person weekly for therapy to monitor for progress towards goals and address any barriers to success; Reduce depression from average severity level of 6/10 down to a 4/10 in next 90 days by engaging in 1-2 positive coping skills daily as part of developing self-care routine; Reduce average anxiety level from 7/10 down to 5/10 in next 90 days by utilizing 1-2 relaxation skills/grounding skills per day, such as mindful breathing, progressive muscle relaxation, positive visualizations.  Progression towards Goal: Progressing     Observations/Objective:   Pt participated in a group discussion on discovering and reconstructing an enduring sense of self as an important aspect for improvement of symptoms of depression, trauma, and anxiety. Pt was encouraged to accomplish her self-determined goals that will enhance her sense of well-being. Pt was receptive to suggestions and encouragement given during the  intervention.      Collaboration of Care: Other: None at this session    Patient/Guardian was advised Release of Information must be obtained prior to any record release in order to collaborate their care with an outside provider.   Patient/Guardian was advised if they have not already done so to contact the registration department to sign all necessary forms in order for [place] to release information regarding their care.   Consent: Patient/Guardian gives verbal consent for treatment and assignment of benefits for services provided during this visit. Patient/Guardian expressed understanding and agreed to proceed.    Assessment and Plan: Counselor will continue to meet with patient to address treatment plan goals. Patient will continue to follow recommendations of providers and implement skills learned in session and practice coping skills between  Sessions.Diagnosis: depression and ptsd     Follow Up Instructions: I discussed the assessment and treatment plan with the patient. The patient was provided an opportunity to ask questions and all were answered. The patient agreed with the plan and demonstrated an understanding of the plan.   The patient was advised to call back or seek an in-person evaluation if the symptoms worsen or if the condition fails to improve as anticipated.   I provided 90 minutes of non-face-to-face time during the group encounter.     Susan Guerra S, LCAS

## 2023-05-26 ENCOUNTER — Ambulatory Visit (INDEPENDENT_AMBULATORY_CARE_PROVIDER_SITE_OTHER): Payer: No Typology Code available for payment source | Admitting: Licensed Clinical Social Worker

## 2023-05-26 ENCOUNTER — Encounter (HOSPITAL_COMMUNITY): Payer: Self-pay | Admitting: Licensed Clinical Social Worker

## 2023-05-26 DIAGNOSIS — F4310 Post-traumatic stress disorder, unspecified: Secondary | ICD-10-CM | POA: Diagnosis not present

## 2023-05-26 DIAGNOSIS — F331 Major depressive disorder, recurrent, moderate: Secondary | ICD-10-CM | POA: Diagnosis not present

## 2023-05-26 NOTE — Progress Notes (Signed)
Virtual Visit via video Note   I connected with Susan Guerra on 05/26/23 at 3-4pm by video enabled telemedicine application and verified that I am speaking with the correct person using two identifiers.   Location: Patient: home Provider: home office   I discussed the limitations of evaluation and management by telemedicine and the availability of in person appointments. The patient expressed understanding and agreed to proceed.   History of Present Illness: Pt is referred to therapy for PTSD (MST) and MDD by the [facility]. Her biggest stressors are her health, taking care of her children (15 & 6) transitioning out of her 12 year relationship. Pt has fibromyalgia and other health issues, which affects her ability to take care of all her family's needs.  She has previous therapy.  Treatment Goals Addressed: Pt will recall the traumatic event without becoming too overwhelmed with emotions 50% of the time, evidenced by self report. Meet with clinician in person weekly for therapy to monitor for progress towards goals and address any barriers to success; Reduce depression from average severity level of 6/10 down to a 4/10 in next 90 days by engaging in 1-2 positive coping skills daily as part of developing self-care routine; Reduce average anxiety level from 7/10 down to 5/10 in next 90 days by utilizing 1-2 relaxation skills/grounding skills per day, such as mindful breathing, progressive muscle relaxation, positive visualizations.  Progression towards Goal: Progressing      Observations/Objective: Patient presented for today's session on time and was alert, oriented x5, with no evidence or self-report of SI/HI or A/V H. Patient reported ongoing compliance with medication and denied any use of alcohol or illicit substances.  Clinician inquired about patient's current emotional ratings, as well as any significant changes in thoughts, feelings or behavior since previous session. Patient's  emotional ratings: 7/10 for depression, 7/10 for anxiety, 2/10 for anger/irritability, recalling traumatic event without becoming too overwhelmed with emotions 50% of the time. All ratings are evidenced by self report. Cln explored trauma emotional ratings with pt who identified her trauma triggers and coping skills used this past week. Pt provided updates on health, family, x relationship, new relationship, job, Sport and exercise psychologist. Marland Kitchen We discussed the impact these stressors have had on pt as well as how she coped with them. Pt continued a discussion about her relationship with her father as a child, her feelings, her relationship with him as an adult. Again, cln provided psychoeducation on  unresolved/generational/historic issues that she continues to live with.        Collaboration of Care: Other: Continue with providers at [facility]  Patient/Guardian was advised Release of Information must be obtained prior to any record release in order to collaborate their care with an outside provider.   Patient/Guardian was advised if they have not already done so to contact the registration department to sign all necessary forms in order for [place] to release information regarding their care.   Consent: Patient/Guardian gives verbal consent for treatment and assignment of benefits for services provided during this visit. Patient/Guardian expressed understanding and agreed to proceed.  Assessment and Plan: Counselor will continue to meet with patient to address treatment plan goals. Patient will continue to follow recommendations of providers and implement skills learned in session and practice skills between sessions. Diagnosis: MDD and PTSD       Follow Up Instructions: I discussed the assessment and treatment plan with the patient. The patient was provided an opportunity to ask questions and all were answered. The patient  agreed with the plan and demonstrated an understanding of the plan.   The patient was advised to call back  or seek an in-person evaluation if the symptoms worsen or if the condition fails to improve as anticipated.   I provided 45 minutes of non-face-to-face time during this encounter.     Anaja Monts S, LCAS

## 2023-06-01 ENCOUNTER — Encounter (HOSPITAL_COMMUNITY): Payer: Self-pay | Admitting: Licensed Clinical Social Worker

## 2023-06-01 ENCOUNTER — Ambulatory Visit (INDEPENDENT_AMBULATORY_CARE_PROVIDER_SITE_OTHER): Payer: No Typology Code available for payment source | Admitting: Licensed Clinical Social Worker

## 2023-06-01 DIAGNOSIS — F329 Major depressive disorder, single episode, unspecified: Secondary | ICD-10-CM | POA: Diagnosis not present

## 2023-06-01 DIAGNOSIS — F4310 Post-traumatic stress disorder, unspecified: Secondary | ICD-10-CM

## 2023-06-01 DIAGNOSIS — F331 Major depressive disorder, recurrent, moderate: Secondary | ICD-10-CM

## 2023-06-01 NOTE — Progress Notes (Signed)
Virtual GroupVisit via video Note   I connected with Susan Guerra on 06/01/23 at 5:00 pm during group session by video enabled telemedicine application and verified that I am speaking with the correct person using two identifiers.   Location: Patient: home Provider: home office   I discussed the limitations of evaluation and management by telemedicine and the availability of in person appointments. The patient expressed understanding and agreed to proceed.   History of Present Illness: Pt is referred to therapy for PTSD (MST) and MDD by the [facility]. Her biggest stressors are her health, taking care of her children (15 & 6) transitioning out of her 12 year relationship. Pt has fibromyalgia and other health issues, which affects her ability to take care of all her family's needs.  She has previous therapy.  Treatment Goals Addressed: Pt will recall the traumatic event without becoming too overwhelmed with emotions 50% of the time, evidenced by self report. Meet with clinician in person weekly for therapy to monitor for progress towards goals and address any barriers to success; Reduce depression from average severity level of 6/10 down to a 4/10 in next 90 days by engaging in 1-2 positive coping skills daily as part of developing self-care routine; Reduce average anxiety level from 7/10 down to 5/10 in next 90 days by utilizing 1-2 relaxation skills/grounding skills per day, such as mindful breathing, progressive muscle relaxation, positive visualizations.  Progression towards Goal: Progressing     Observations/Objective:    Pt participated in a group discussion on current source of stressors. Pt identified her current stressor: grief of her parent's passing, which led the discussion to coping skills. Pt was encouraged to continue using her current coping techniques.    Collaboration of Care: Other: None at this session    Patient/Guardian was advised Release of Information must  be obtained prior to any record release in order to collaborate their care with an outside provider.   Patient/Guardian was advised if they have not already done so to contact the registration department to sign all necessary forms in order for [place] to release information regarding their care.   Consent: Patient/Guardian gives verbal consent for treatment and assignment of benefits for services provided during this visit. Patient/Guardian expressed understanding and agreed to proceed.    Assessment and Plan: Counselor will continue to meet with patient to address treatment plan goals. Patient will continue to follow recommendations of providers and implement skills learned in session and practice coping skills between  Sessions.Diagnosis: depression and ptsd     Follow Up Instructions: I discussed the assessment and treatment plan with the patient. The patient was provided an opportunity to ask questions and all were answered. The patient agreed with the plan and demonstrated an understanding of the plan.   The patient was advised to call back or seek an in-person evaluation if the symptoms worsen or if the condition fails to improve as anticipated.   I provided 90 minutes of non-face-to-face time during the group encounter.     Cinnamon Morency S, LCAS

## 2023-06-02 ENCOUNTER — Ambulatory Visit (INDEPENDENT_AMBULATORY_CARE_PROVIDER_SITE_OTHER): Payer: No Typology Code available for payment source | Admitting: Licensed Clinical Social Worker

## 2023-06-02 ENCOUNTER — Encounter (HOSPITAL_COMMUNITY): Payer: Self-pay | Admitting: Licensed Clinical Social Worker

## 2023-06-02 DIAGNOSIS — F331 Major depressive disorder, recurrent, moderate: Secondary | ICD-10-CM

## 2023-06-02 DIAGNOSIS — F4310 Post-traumatic stress disorder, unspecified: Secondary | ICD-10-CM

## 2023-06-02 NOTE — Progress Notes (Signed)
Virtual Visit via video Note   I connected with Susan Guerra on 06/02/23 at 1-2pm by video enabled telemedicine application and verified that I am speaking with the correct person using two identifiers.   Location: Patient: home Provider: home office   I discussed the limitations of evaluation and management by telemedicine and the availability of in person appointments. The patient expressed understanding and agreed to proceed.   History of Present Illness: Pt is referred to therapy for PTSD (MST) and MDD by the [facility]. Her biggest stressors are her health, taking care of her children (15 & 6) transitioning out of her 12 year relationship. Pt has fibromyalgia and other health issues, which affects her ability to take care of all her family's needs.  She has previous therapy.  Treatment Goals Addressed: Pt will recall the traumatic event without becoming too overwhelmed with emotions 50% of the time, evidenced by self report. Meet with clinician in person weekly for therapy to monitor for progress towards goals and address any barriers to success; Reduce depression from average severity level of 6/10 down to a 4/10 in next 90 days by engaging in 1-2 positive coping skills daily as part of developing self-care routine; Reduce average anxiety level from 7/10 down to 5/10 in next 90 days by utilizing 1-2 relaxation skills/grounding skills per day, such as mindful breathing, progressive muscle relaxation, positive visualizations.  Progression towards Goal: Progressing      Observations/Objective: Patient presented for today's session on time and was alert, oriented x5, with no evidence or self-report of SI/HI or A/V H. Patient reported ongoing compliance with medication and denied any use of alcohol or illicit substances.  Clinician inquired about patient's current emotional ratings, as well as any significant changes in thoughts, feelings or behavior since previous session. Patient's  emotional ratings: 7/10 for depression, 7/10 for anxiety, 2/10 for anger/irritability, recalling traumatic event without becoming too overwhelmed with emotions 50% of the time. All ratings are evidenced by self report. Cln explored trauma emotional ratings with pt who identified her trauma triggers and coping skills used this past week. Pt provided updates on health, family, x relationship, new relationship, job, Sport and exercise psychologist. We discussed the impact these stressors have had on pt as well as how she coped with them. "I had a breakdown Monday thinking about the passing of my parents." Cln asked open ended questions, discussing coping skills. "I asked my  my x to come over and help me walk through the grief." Cln used CBT to assist pt with difficult grief reactions by taking her through the stages inherent in the grieving process, using both cognitive and behavioural techniques to cope more effectively with her clinical symptoms of depression/anxiety as well as acting as an aid in the healing process.         Collaboration of Care: Other: Continue with providers at [facility]  Patient/Guardian was advised Release of Information must be obtained prior to any record release in order to collaborate their care with an outside provider.   Patient/Guardian was advised if they have not already done so to contact the registration department to sign all necessary forms in order for [place] to release information regarding their care.   Consent: Patient/Guardian gives verbal consent for treatment and assignment of benefits for services provided during this visit. Patient/Guardian expressed understanding and agreed to proceed.  Assessment and Plan: Counselor will continue to meet with patient to address treatment plan goals. Patient will continue to follow recommendations of providers and  implement skills learned in session and practice skills between sessions. Diagnosis: MDD and PTSD       Follow Up Instructions: I discussed the  assessment and treatment plan with the patient. The patient was provided an opportunity to ask questions and all were answered. The patient agreed with the plan and demonstrated an understanding of the plan.   The patient was advised to call back or seek an in-person evaluation if the symptoms worsen or if the condition fails to improve as anticipated.   I provided 60 minutes of non-face-to-face time during this encounter.     Falan Hensler S, LCAS

## 2023-06-08 ENCOUNTER — Encounter (HOSPITAL_COMMUNITY): Payer: Self-pay

## 2023-06-08 ENCOUNTER — Ambulatory Visit (HOSPITAL_COMMUNITY): Payer: Medicaid Other | Admitting: Licensed Clinical Social Worker

## 2023-06-09 ENCOUNTER — Ambulatory Visit (INDEPENDENT_AMBULATORY_CARE_PROVIDER_SITE_OTHER): Payer: No Typology Code available for payment source | Admitting: Licensed Clinical Social Worker

## 2023-06-09 ENCOUNTER — Encounter (HOSPITAL_COMMUNITY): Payer: Self-pay | Admitting: Licensed Clinical Social Worker

## 2023-06-09 DIAGNOSIS — F331 Major depressive disorder, recurrent, moderate: Secondary | ICD-10-CM

## 2023-06-09 DIAGNOSIS — F4310 Post-traumatic stress disorder, unspecified: Secondary | ICD-10-CM | POA: Diagnosis not present

## 2023-06-09 NOTE — Progress Notes (Signed)
Virtual Visit via video Note   I connected with Susan Guerra on 06/09/23 at 1-1:45pm by video enabled telemedicine application and verified that I am speaking with the correct person using two identifiers.   Location: Patient: home Provider: home office   I discussed the limitations of evaluation and management by telemedicine and the availability of in person appointments. The patient expressed understanding and agreed to proceed.   History of Present Illness: Pt is referred to therapy for PTSD (MST) and MDD by the [facility]. Her biggest stressors are her health, taking care of her children (15 & 6) transitioning out of her 12 year relationship. Pt has fibromyalgia and other health issues, which affects her ability to take care of all her family's needs.  She has previous therapy.  Treatment Goals Addressed: Pt will recall the traumatic event without becoming too overwhelmed with emotions 50% of the time, evidenced by self report. Meet with clinician in person weekly for therapy to monitor for progress towards goals and address any barriers to success; Reduce depression from average severity level of 6/10 down to a 4/10 in next 90 days by engaging in 1-2 positive coping skills daily as part of developing self-care routine; Reduce average anxiety level from 7/10 down to 5/10 in next 90 days by utilizing 1-2 relaxation skills/grounding skills per day, such as mindful breathing, progressive muscle relaxation, positive visualizations.  Progression towards Goal: Progressing      Observations/Objective: Patient presented for today's session on time and was alert, oriented x5, with no evidence or self-report of SI/HI or A/V H. Patient reported ongoing compliance with medication and denied any use of alcohol or illicit substances.  Clinician inquired about patient's current emotional ratings, as well as any significant changes in thoughts, feelings or behavior since previous session. Patient's  emotional ratings: 7/10 for depression, 7/10 for anxiety, 3/10 for anger/irritability, recalling traumatic event without becoming too overwhelmed with emotions 50% of the time. All ratings are evidenced by self report. Cln explored trauma emotional ratings with pt who identified her trauma triggers and coping skills used this past week. Pt provided updates on health, family, x relationship, new relationship, job, Sport and exercise psychologist. We discussed the impact these stressors have had on pt as well as how she coped with them. "I didn't work yesterday due to health issues, so I wasn't able to attend group last night." Cln asked open ended questions. Cln inquired about her grief this week. "I'm doing better with my grief emotions this week." Cln and pt rediscussed the stages of grief.         Collaboration of Care: Other: Continue with providers at [facility]  Patient/Guardian was advised Release of Information must be obtained prior to any record release in order to collaborate their care with an outside provider.   Patient/Guardian was advised if they have not already done so to contact the registration department to sign all necessary forms in order for [place] to release information regarding their care.   Consent: Patient/Guardian gives verbal consent for treatment and assignment of benefits for services provided during this visit. Patient/Guardian expressed understanding and agreed to proceed.  Assessment and Plan: Counselor will continue to meet with patient to address treatment plan goals. Patient will continue to follow recommendations of providers and implement skills learned in session and practice skills between sessions. Diagnosis: MDD and PTSD       Follow Up Instructions: I discussed the assessment and treatment plan with the patient. The patient was provided an  opportunity to ask questions and all were answered. The patient agreed with the plan and demonstrated an understanding of the plan.   The patient was  advised to call back or seek an in-person evaluation if the symptoms worsen or if the condition fails to improve as anticipated.   I provided 45 minutes of non-face-to-face time during this encounter.     Susan Guerra S, LCAS

## 2023-06-15 ENCOUNTER — Ambulatory Visit (INDEPENDENT_AMBULATORY_CARE_PROVIDER_SITE_OTHER): Payer: No Typology Code available for payment source | Admitting: Licensed Clinical Social Worker

## 2023-06-15 ENCOUNTER — Encounter (HOSPITAL_COMMUNITY): Payer: Self-pay | Admitting: Licensed Clinical Social Worker

## 2023-06-15 DIAGNOSIS — F4310 Post-traumatic stress disorder, unspecified: Secondary | ICD-10-CM

## 2023-06-15 DIAGNOSIS — F331 Major depressive disorder, recurrent, moderate: Secondary | ICD-10-CM

## 2023-06-15 DIAGNOSIS — F32A Depression, unspecified: Secondary | ICD-10-CM | POA: Diagnosis not present

## 2023-06-15 NOTE — Progress Notes (Addendum)
Virtual GroupVisit via video Note   I connected with Susan Guerra on 06/15/23 at 5:00 pm during group session by video enabled telemedicine application and verified that I am speaking with the correct person using two identifiers.   Location: Patient: home Provider: home office   I discussed the limitations of evaluation and management by telemedicine and the availability of in person appointments. The patient expressed understanding and agreed to proceed.   History of Present Illness: Pt is referred to therapy for PTSD (MST) and MDD by the [facility]. Her biggest stressors are her health, taking care of her children (15 & 6) transitioning out of her 12 year relationship. Pt has fibromyalgia and other health issues, which affects her ability to take care of all her family's needs.  She has previous therapy.  Treatment Goals Addressed: Pt will recall the traumatic event without becoming too overwhelmed with emotions 50% of the time, evidenced by self report. Meet with clinician in person weekly for therapy to monitor for progress towards goals and address any barriers to success; Reduce depression from average severity level of 6/10 down to a 4/10 in next 90 days by engaging in 1-2 positive coping skills daily as part of developing self-care routine; Reduce average anxiety level from 7/10 down to 5/10 in next 90 days by utilizing 1-2 relaxation skills/grounding skills per day, such as mindful breathing, progressive muscle relaxation, positive visualizations.  Progression towards Goal: Progressing     Observations/Objective:  Patient participated in a discussion on group introspection, looking inward, self-examination, reflection, and inspection of thoughts. Patient described self-reflection to describe her reflective thoughts. Patient was encouraged to continue using introspection.       Collaboration of Care: Other: None at this session    Patient/Guardian was advised Release of  Information must be obtained prior to any record release in order to collaborate their care with an outside provider.   Patient/Guardian was advised if they have not already done so to contact the registration department to sign all necessary forms in order for [place] to release information regarding their care.   Consent: Patient/Guardian gives verbal consent for treatment and assignment of benefits for services provided during this visit. Patient/Guardian expressed understanding and agreed to proceed.    Assessment and Plan: Counselor will continue to meet with patient to address treatment plan goals. Patient will continue to follow recommendations of providers and implement skills learned in session and practice coping skills between  Sessions.Diagnosis: depression and ptsd     Follow Up Instructions: I discussed the assessment and treatment plan with the patient. The patient was provided an opportunity to ask questions and all were answered. The patient agreed with the plan and demonstrated an understanding of the plan.   The patient was advised to call back or seek an in-person evaluation if the symptoms worsen or if the condition fails to improve as anticipated.   I provided 90 minutes of non-face-to-face time during the group encounter.     Grantley Savage S, LCAS

## 2023-06-16 ENCOUNTER — Ambulatory Visit (INDEPENDENT_AMBULATORY_CARE_PROVIDER_SITE_OTHER): Payer: No Typology Code available for payment source | Admitting: Licensed Clinical Social Worker

## 2023-06-16 ENCOUNTER — Encounter (HOSPITAL_COMMUNITY): Payer: Self-pay | Admitting: Licensed Clinical Social Worker

## 2023-06-16 DIAGNOSIS — F329 Major depressive disorder, single episode, unspecified: Secondary | ICD-10-CM | POA: Diagnosis not present

## 2023-06-16 DIAGNOSIS — F4310 Post-traumatic stress disorder, unspecified: Secondary | ICD-10-CM

## 2023-06-16 DIAGNOSIS — F331 Major depressive disorder, recurrent, moderate: Secondary | ICD-10-CM

## 2023-06-16 NOTE — Progress Notes (Signed)
Virtual Visit via video Note   I connected with Susan Guerra on 06/16/23 at 1-1:45pm by video enabled telemedicine application and verified that I am speaking with the correct person using two identifiers.   Location: Patient: home Provider: home office   I discussed the limitations of evaluation and management by telemedicine and the availability of in person appointments. The patient expressed understanding and agreed to proceed.   History of Present Illness: Pt is referred to therapy for PTSD (MST) and MDD by the [facility]. Her biggest stressors are her health, taking care of her children (15 & 6) transitioning out of her 12 year relationship. Pt has fibromyalgia and other health issues, which affects her ability to take care of all her family's needs.  She has previous therapy.  Treatment Goals Addressed: Pt will recall the traumatic event without becoming too overwhelmed with emotions 50% of the time, evidenced by self report. Meet with clinician in person weekly for therapy to monitor for progress towards goals and address any barriers to success; Reduce depression from average severity level of 6/10 down to a 4/10 in next 90 days by engaging in 1-2 positive coping skills daily as part of developing self-care routine; Reduce average anxiety level from 7/10 down to 5/10 in next 90 days by utilizing 1-2 relaxation skills/grounding skills per day, such as mindful breathing, progressive muscle relaxation, positive visualizations.  Progression towards Goal: Progressing      Observations/Objective: Patient presented for today's session on time and was alert, oriented x5, with no evidence or self-report of SI/HI or A/V H. Patient reported ongoing compliance with medication and denied any use of alcohol or illicit substances.  Clinician inquired about patient's current emotional ratings, as well as any significant changes in thoughts, feelings or behavior since previous session. Patient's  emotional ratings: 7/10 for depression, 7/10 for anxiety, 3/10 for anger/irritability, recalling traumatic event without becoming too overwhelmed with emotions 50% of the time. All ratings are evidenced by self report. Cln explored trauma emotional ratings with pt who identified her trauma triggers and coping skills used this past week. Pt provided updates on health, family, x relationship, new relationship, job, Sport and exercise psychologist. We discussed the impact these stressors have had on pt as well as how she coped with them. "I didn't work yesterday due to health issues." Cln asked open ended questions. Clinician utilized CBT to process thoughts, feelings, and behaviors. Clinician noted the challenge in being productive, working, and feeling positive when physical health is poor. Clinician provided supportive feedback.          Collaboration of Care: Other: Continue with providers at [facility]  Patient/Guardian was advised Release of Information must be obtained prior to any record release in order to collaborate their care with an outside provider.   Patient/Guardian was advised if they have not already done so to contact the registration department to sign all necessary forms in order for [place] to release information regarding their care.   Consent: Patient/Guardian gives verbal consent for treatment and assignment of benefits for services provided during this visit. Patient/Guardian expressed understanding and agreed to proceed.  Assessment and Plan: Counselor will continue to meet with patient to address treatment plan goals. Patient will continue to follow recommendations of providers and implement skills learned in session and practice skills between sessions. Diagnosis: MDD and PTSD       Follow Up Instructions: I discussed the assessment and treatment plan with the patient. The patient was provided an opportunity to ask  questions and all were answered. The patient agreed with the plan and demonstrated an  understanding of the plan.   The patient was advised to call back or seek an in-person evaluation if the symptoms worsen or if the condition fails to improve as anticipated.   I provided 45 minutes of non-face-to-face time during this encounter.     Darcel Frane S, LCAS

## 2023-06-22 ENCOUNTER — Encounter (HOSPITAL_COMMUNITY): Payer: Self-pay | Admitting: Licensed Clinical Social Worker

## 2023-06-22 ENCOUNTER — Ambulatory Visit (HOSPITAL_COMMUNITY): Payer: No Typology Code available for payment source | Admitting: Licensed Clinical Social Worker

## 2023-06-22 DIAGNOSIS — F331 Major depressive disorder, recurrent, moderate: Secondary | ICD-10-CM

## 2023-06-22 DIAGNOSIS — F32A Depression, unspecified: Secondary | ICD-10-CM | POA: Diagnosis not present

## 2023-06-22 DIAGNOSIS — F4310 Post-traumatic stress disorder, unspecified: Secondary | ICD-10-CM

## 2023-06-22 NOTE — Progress Notes (Signed)
Virtual GroupVisit via video Note   I connected with Susan Guerra on 06/22/23 at 5:00 pm during group session by video enabled telemedicine application and verified that I am speaking with the correct person using two identifiers.   Location: Patient: home Provider: home office   I discussed the limitations of evaluation and management by telemedicine and the availability of in person appointments. The patient expressed understanding and agreed to proceed.   History of Present Illness: Pt is referred to therapy for PTSD (MST) and MDD by the [facility]. Her biggest stressors are her health, taking care of her children (15 & 6) transitioning out of her 12 year relationship. Pt has fibromyalgia and other health issues, which affects her ability to take care of all her family's needs.  She has previous therapy.  Treatment Goals Addressed: Pt will recall the traumatic event without becoming too overwhelmed with emotions 50% of the time, evidenced by self report. Meet with clinician in person weekly for therapy to monitor for progress towards goals and address any barriers to success; Reduce depression from average severity level of 6/10 down to a 4/10 in next 90 days by engaging in 1-2 positive coping skills daily as part of developing self-care routine; Reduce average anxiety level from 7/10 down to 5/10 in next 90 days by utilizing 1-2 relaxation skills/grounding skills per day, such as mindful breathing, progressive muscle relaxation, positive visualizations.  Progression towards Goal: Progressing     Observations/Objective:  Pt participated in a group discussion about being productive even when physical health is poor. Cln used CBT to help pt process thoughts, feelings, and behaviors. Cln provided supportive feedback.      Collaboration of Care: Other: None at this session    Patient/Guardian was advised Release of Information must be obtained prior to any record release in order to  collaborate their care with an outside provider.   Patient/Guardian was advised if they have not already done so to contact the registration department to sign all necessary forms in order for [place] to release information regarding their care.   Consent: Patient/Guardian gives verbal consent for treatment and assignment of benefits for services provided during this visit. Patient/Guardian expressed understanding and agreed to proceed.    Assessment and Plan: Counselor will continue to meet with patient to address treatment plan goals. Patient will continue to follow recommendations of providers and implement skills learned in session and practice coping skills between  Sessions.Diagnosis: depression and ptsd     Follow Up Instructions: I discussed the assessment and treatment plan with the patient. The patient was provided an opportunity to ask questions and all were answered. The patient agreed with the plan and demonstrated an understanding of the plan.   The patient was advised to call back or seek an in-person evaluation if the symptoms worsen or if the condition fails to improve as anticipated.   I provided 90 minutes of non-face-to-face time during the group encounter.     Tupac Jeffus S, LCAS

## 2023-06-23 ENCOUNTER — Ambulatory Visit (HOSPITAL_COMMUNITY): Payer: No Typology Code available for payment source | Admitting: Licensed Clinical Social Worker

## 2023-06-23 ENCOUNTER — Encounter (HOSPITAL_COMMUNITY): Payer: Self-pay

## 2023-06-29 ENCOUNTER — Ambulatory Visit (HOSPITAL_COMMUNITY): Payer: Medicaid Other | Admitting: Licensed Clinical Social Worker

## 2023-06-30 ENCOUNTER — Ambulatory Visit (INDEPENDENT_AMBULATORY_CARE_PROVIDER_SITE_OTHER): Payer: No Typology Code available for payment source | Admitting: Licensed Clinical Social Worker

## 2023-06-30 DIAGNOSIS — F4310 Post-traumatic stress disorder, unspecified: Secondary | ICD-10-CM

## 2023-06-30 DIAGNOSIS — F331 Major depressive disorder, recurrent, moderate: Secondary | ICD-10-CM | POA: Diagnosis not present

## 2023-07-06 ENCOUNTER — Ambulatory Visit (INDEPENDENT_AMBULATORY_CARE_PROVIDER_SITE_OTHER): Payer: No Typology Code available for payment source | Admitting: Licensed Clinical Social Worker

## 2023-07-06 ENCOUNTER — Encounter (HOSPITAL_COMMUNITY): Payer: Self-pay | Admitting: Licensed Clinical Social Worker

## 2023-07-06 DIAGNOSIS — F4310 Post-traumatic stress disorder, unspecified: Secondary | ICD-10-CM

## 2023-07-06 DIAGNOSIS — F331 Major depressive disorder, recurrent, moderate: Secondary | ICD-10-CM

## 2023-07-06 NOTE — Progress Notes (Signed)
Virtual GroupVisit via video Note   I connected with Susan Guerra on 07/06/23 at 5:00 pm during group session by video enabled telemedicine application and verified that I am speaking with the correct person using two identifiers.   Location: Patient: home Provider: home office   I discussed the limitations of evaluation and management by telemedicine and the availability of in person appointments. The patient expressed understanding and agreed to proceed.   History of Present Illness: Pt is referred to therapy for PTSD (MST) and MDD by the [facility]. Her biggest stressors are her health, taking care of her children (15 & 6) transitioning out of her 12 year relationship. Pt has fibromyalgia and other health issues, which affects her ability to take care of all her family's needs.  She has previous therapy.  Treatment Goals Addressed: Pt will recall the traumatic event without becoming too overwhelmed with emotions 50% of the time, evidenced by self report. Meet with clinician in person weekly for therapy to monitor for progress towards goals and address any barriers to success; Reduce depression from average severity level of 6/10 down to a 4/10 in next 90 days by engaging in 1-2 positive coping skills daily as part of developing self-care routine; Reduce average anxiety level from 7/10 down to 5/10 in next 90 days by utilizing 1-2 relaxation skills/grounding skills per day, such as mindful breathing, progressive muscle relaxation, positive visualizations.  Progression towards Goal: Progressing     Observations/Objective:  Patient participated in a discussion on relationships (spousal, parent/child, intimate, partnership) where the group discussed effective communication, I-statements and fair fighting. Pt was encouraged to work on her current relationship using effective communication including I statements and fair fighting rules.   Collaboration of Care: Other: None at this  session    Patient/Guardian was advised Release of Information must be obtained prior to any record release in order to collaborate their care with an outside provider.   Patient/Guardian was advised if they have not already done so to contact the registration department to sign all necessary forms in order for [place] to release information regarding their care.   Consent: Patient/Guardian gives verbal consent for treatment and assignment of benefits for services provided during this visit. Patient/Guardian expressed understanding and agreed to proceed.    Assessment and Plan: Counselor will continue to meet with patient to address treatment plan goals. Patient will continue to follow recommendations of providers and implement skills learned in session and practice coping skills between  Sessions.Diagnosis: depression and ptsd     Follow Up Instructions: I discussed the assessment and treatment plan with the patient. The patient was provided an opportunity to ask questions and all were answered. The patient agreed with the plan and demonstrated an understanding of the plan.   The patient was advised to call back or seek an in-person evaluation if the symptoms worsen or if the condition fails to improve as anticipated.   I provided 120 minutes of non-face-to-face time during the group encounter.     Matyas Baisley S, LCAS

## 2023-07-07 ENCOUNTER — Ambulatory Visit (INDEPENDENT_AMBULATORY_CARE_PROVIDER_SITE_OTHER): Payer: No Typology Code available for payment source | Admitting: Licensed Clinical Social Worker

## 2023-07-07 DIAGNOSIS — F331 Major depressive disorder, recurrent, moderate: Secondary | ICD-10-CM | POA: Diagnosis not present

## 2023-07-07 DIAGNOSIS — F4310 Post-traumatic stress disorder, unspecified: Secondary | ICD-10-CM | POA: Diagnosis not present

## 2023-07-13 ENCOUNTER — Ambulatory Visit (HOSPITAL_COMMUNITY): Payer: No Typology Code available for payment source | Admitting: Licensed Clinical Social Worker

## 2023-07-13 ENCOUNTER — Encounter (HOSPITAL_COMMUNITY): Payer: Self-pay | Admitting: Licensed Clinical Social Worker

## 2023-07-13 DIAGNOSIS — F331 Major depressive disorder, recurrent, moderate: Secondary | ICD-10-CM

## 2023-07-13 DIAGNOSIS — F4310 Post-traumatic stress disorder, unspecified: Secondary | ICD-10-CM

## 2023-07-13 NOTE — Progress Notes (Deleted)
Virtual GroupVisit via video Note   I connected with Susan Guerra on 07/06/23 at 5:00 pm during group session by video enabled telemedicine application and verified that I am speaking with the correct person using two identifiers.   Location: Patient: home Provider: home office   I discussed the limitations of evaluation and management by telemedicine and the availability of in person appointments. The patient expressed understanding and agreed to proceed.   History of Present Illness: Pt is referred to therapy for PTSD (MST) and MDD by the [facility]. Her biggest stressors are her health, taking care of her children (15 & 6) transitioning out of her 12 year relationship. Pt has fibromyalgia and other health issues, which affects her ability to take care of all her family's needs.  She has previous therapy.  Treatment Goals Addressed: Pt will recall the traumatic event without becoming too overwhelmed with emotions 50% of the time, evidenced by self report. Meet with clinician in person weekly for therapy to monitor for progress towards goals and address any barriers to success; Reduce depression from average severity level of 6/10 down to a 4/10 in next 90 days by engaging in 1-2 positive coping skills daily as part of developing self-care routine; Reduce average anxiety level from 7/10 down to 5/10 in next 90 days by utilizing 1-2 relaxation skills/grounding skills per day, such as mindful breathing, progressive muscle relaxation, positive visualizations.  Progression towards Goal: Progressing     Observations/Objective:  Patient participated in a group discussion on celebrating the holidays while serving in the [employer]. Patient described her holidays and coping skills used to deal with feelings surrounding the holidays. Clinician congratulated patient on coping skills used and tolerating the holidays with or without family.   Collaboration of Care: Other: None at this  session    Patient/Guardian was advised Release of Information must be obtained prior to any record release in order to collaborate their care with an outside provider.   Patient/Guardian was advised if they have not already done so to contact the registration department to sign all necessary forms in order for [place] to release information regarding their care.   Consent: Patient/Guardian gives verbal consent for treatment and assignment of benefits for services provided during this visit. Patient/Guardian expressed understanding and agreed to proceed.    Assessment and Plan: Counselor will continue to meet with patient to address treatment plan goals. Patient will continue to follow recommendations of providers and implement skills learned in session and practice coping skills between  Sessions.Diagnosis: depression and ptsd     Follow Up Instructions: I discussed the assessment and treatment plan with the patient. The patient was provided an opportunity to ask questions and all were answered. The patient agreed with the plan and demonstrated an understanding of the plan.   The patient was advised to call back or seek an in-person evaluation if the symptoms worsen or if the condition fails to improve as anticipated.   I provided 90 minutes of non-face-to-face time during the group encounter.     Kynzlie Hilleary S, LCAS

## 2023-07-13 NOTE — Progress Notes (Signed)
Pt did not present for session. Ottis Stain, LCAS

## 2023-07-13 NOTE — Progress Notes (Signed)
Virtual Visit via video Note   I connected with Susan Guerra on 06/30/23 at 1-1:45pm by video enabled telemedicine application and verified that I am speaking with the correct person using two identifiers.   Location: Patient: home Provider: home office   I discussed the limitations of evaluation and management by telemedicine and the availability of in person appointments. The patient expressed understanding and agreed to proceed.   History of Present Illness: Pt is referred to therapy for PTSD (MST) and MDD by the [facility]. Her biggest stressors are her health, taking care of her children (15 & 6) transitioning out of her 12 year relationship. Pt has fibromyalgia and other health issues, which affects her ability to take care of all her family's needs.  She has previous therapy.  Treatment Goals Addressed: Pt will recall the traumatic event without becoming too overwhelmed with emotions 50% of the time, evidenced by self report. Meet with clinician in person weekly for therapy to monitor for progress towards goals and address any barriers to success; Reduce depression from average severity level of 6/10 down to a 4/10 in next 90 days by engaging in 1-2 positive coping skills daily as part of developing self-care routine; Reduce average anxiety level from 7/10 down to 5/10 in next 90 days by utilizing 1-2 relaxation skills/grounding skills per day, such as mindful breathing, progressive muscle relaxation, positive visualizations.  Progression towards Goal: Progressing      Observations/Objective: Patient presented for today's session on time and was alert, oriented x5, with no evidence or self-report of SI/HI or A/V H. Patient reported ongoing compliance with medication and denied any use of alcohol or illicit substances.  Clinician inquired about patient's current emotional ratings, as well as any significant changes in thoughts, feelings or behavior since previous session. Patient's  emotional ratings: 7/10 for depression, 7/10 for anxiety, 3/10 for anger/irritability, recalling traumatic event without becoming too overwhelmed with emotions 50% of the time. All ratings are evidenced by self report. Cln explored trauma emotional ratings with pt who identified her trauma triggers and coping skills used this past week. Pt provided updates on health, family, x relationship, new relationship, job, Sport and exercise psychologist. We discussed the impact these stressors have had on pt as well as how she coped with them.   Yesterday was veteran's day. 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 Cln allowed pt to discuss her time  in the [employer]. Cln thanked her for his service.           Collaboration of Care: Other: Continue with providers at [facility]  Patient/Guardian was advised Release of Information must be obtained prior to any record release in order to collaborate their care with an outside provider.   Patient/Guardian was advised if they have not already done so to contact the registration department to sign all necessary forms in order for [place] to release information regarding their care.   Consent: Patient/Guardian gives verbal consent for treatment and assignment of benefits for services provided during this visit. Patient/Guardian expressed understanding and agreed to proceed.  Assessment and Plan: Counselor will continue to meet with patient to address treatment plan goals. Patient will continue to follow recommendations of providers and implement skills learned in session and practice skills between sessions. Diagnosis: MDD and PTSD       Follow Up Instructions: I discussed the assessment and treatment plan with the patient. The patient was provided an opportunity to ask questions and all were  answered. The patient agreed with the plan and demonstrated an understanding of the plan.   The patient was advised to call back or seek an in-person evaluation if the  symptoms worsen or if the condition fails to improve as anticipated.   I provided 45 minutes of non-face-to-face time during this encounter.     Jordon Kristiansen S, LCAS

## 2023-07-14 ENCOUNTER — Ambulatory Visit (HOSPITAL_COMMUNITY): Payer: No Typology Code available for payment source | Admitting: Licensed Clinical Social Worker

## 2023-07-17 ENCOUNTER — Encounter (HOSPITAL_COMMUNITY): Payer: Self-pay | Admitting: Licensed Clinical Social Worker

## 2023-07-17 NOTE — Progress Notes (Signed)
Virtual Visit via video Note   I connected with Susan Guerra on 07/07/23 at 1-1:45pm by video enabled telemedicine application and verified that I am speaking with the correct person using two identifiers.   Location: Patient: home Provider: home office   I discussed the limitations of evaluation and management by telemedicine and the availability of in person appointments. The patient expressed understanding and agreed to proceed.   History of Present Illness: Pt is referred to therapy for PTSD (MST) and MDD by the [facility]. Her biggest stressors are her health, taking care of her children (15 & 6) transitioning out of her 12 year relationship. Pt has fibromyalgia and other health issues, which affects her ability to take care of all her family's needs.  She has previous therapy.  Treatment Goals Addressed: Pt will recall the traumatic event without becoming too overwhelmed with emotions 50% of the time, evidenced by self report. Meet with clinician in person weekly for therapy to monitor for progress towards goals and address any barriers to success; Reduce depression from average severity level of 6/10 down to a 4/10 in next 90 days by engaging in 1-2 positive coping skills daily as part of developing self-care routine; Reduce average anxiety level from 7/10 down to 5/10 in next 90 days by utilizing 1-2 relaxation skills/grounding skills per day, such as mindful breathing, progressive muscle relaxation, positive visualizations.  Progression towards Goal: Progressing      Observations/Objective: Patient presented for today's session on time and was alert, oriented x5, with no evidence or self-report of SI/HI or A/V H. Patient reported ongoing compliance with medication and denied any use of alcohol or illicit substances.  Clinician inquired about patient's current emotional ratings, as well as any significant changes in thoughts, feelings or behavior since previous session. Patient's  emotional ratings: 7/10 for depression, 7/10 for anxiety, 3/10 for anger/irritability, recalling traumatic event without becoming too overwhelmed with emotions 50% of the time. All ratings are evidenced by self report. Cln explored trauma emotional ratings with pt who identified her trauma triggers and coping skills used this past week. Pt provided updates on health, family, x relationship, new relationship, job, Sport and exercise psychologist. We discussed the impact these stressors have had on pt as well as how she coped with them. Cln provided psychoeducation sion on using mindfulness-based stress response to assist patient in lowering her stress response. Patient was encouraged to use mindfulness skills as a coping skill to assist in lowering her stress response to her many triggers and stressors.  Marland Kitchen           Collaboration of Care: Other: Continue with providers at [facility]  Patient/Guardian was advised Release of Information must be obtained prior to any record release in order to collaborate their care with an outside provider.   Patient/Guardian was advised if they have not already done so to contact the registration department to sign all necessary forms in order for [place] to release information regarding their care.   Consent: Patient/Guardian gives verbal consent for treatment and assignment of benefits for services provided during this visit. Patient/Guardian expressed understanding and agreed to proceed.  Assessment and Plan: Counselor will continue to meet with patient to address treatment plan goals. Patient will continue to follow recommendations of providers and implement skills learned in session and practice skills between sessions. Diagnosis: MDD and PTSD       Follow Up Instructions: I discussed the assessment and treatment plan with the patient. The patient was provided an opportunity  to ask questions and all were answered. The patient agreed with the plan and demonstrated an understanding of the plan.    The patient was advised to call back or seek an in-person evaluation if the symptoms worsen or if the condition fails to improve as anticipated.   I provided 45 minutes of non-face-to-face time during this encounter.     Adolfo Granieri S, LCAS

## 2023-07-20 ENCOUNTER — Ambulatory Visit (INDEPENDENT_AMBULATORY_CARE_PROVIDER_SITE_OTHER): Payer: No Typology Code available for payment source | Admitting: Licensed Clinical Social Worker

## 2023-07-20 DIAGNOSIS — F4310 Post-traumatic stress disorder, unspecified: Secondary | ICD-10-CM | POA: Diagnosis not present

## 2023-07-20 DIAGNOSIS — F329 Major depressive disorder, single episode, unspecified: Secondary | ICD-10-CM

## 2023-07-20 DIAGNOSIS — F331 Major depressive disorder, recurrent, moderate: Secondary | ICD-10-CM

## 2023-07-21 ENCOUNTER — Ambulatory Visit (HOSPITAL_COMMUNITY): Payer: No Typology Code available for payment source | Admitting: Licensed Clinical Social Worker

## 2023-07-21 DIAGNOSIS — F4310 Post-traumatic stress disorder, unspecified: Secondary | ICD-10-CM | POA: Diagnosis not present

## 2023-07-21 DIAGNOSIS — F329 Major depressive disorder, single episode, unspecified: Secondary | ICD-10-CM

## 2023-07-21 DIAGNOSIS — F331 Major depressive disorder, recurrent, moderate: Secondary | ICD-10-CM

## 2023-07-27 ENCOUNTER — Encounter (HOSPITAL_COMMUNITY): Payer: Self-pay | Admitting: Licensed Clinical Social Worker

## 2023-07-27 ENCOUNTER — Ambulatory Visit (INDEPENDENT_AMBULATORY_CARE_PROVIDER_SITE_OTHER): Payer: No Typology Code available for payment source | Admitting: Licensed Clinical Social Worker

## 2023-07-27 DIAGNOSIS — F4310 Post-traumatic stress disorder, unspecified: Secondary | ICD-10-CM | POA: Diagnosis not present

## 2023-07-27 DIAGNOSIS — F329 Major depressive disorder, single episode, unspecified: Secondary | ICD-10-CM | POA: Diagnosis not present

## 2023-07-27 DIAGNOSIS — F331 Major depressive disorder, recurrent, moderate: Secondary | ICD-10-CM

## 2023-07-27 NOTE — Progress Notes (Signed)
Virtual GroupVisit via video Note   I connected with Susan Guerra on 07/20/23 at 5:00 pm during group session by video enabled telemedicine application and verified that I am speaking with the correct person using two identifiers.   Location: Patient: home Provider: home office   I discussed the limitations of evaluation and management by telemedicine and the availability of in person appointments. The patient expressed understanding and agreed to proceed.   History of Present Illness: Pt is referred to therapy for PTSD (MST) and MDD by the [facility]. Her biggest stressors are her health, taking care of her children (15 & 6) transitioning out of her 12 year relationship. Pt has fibromyalgia and other health issues, which affects her ability to take care of all her family's needs.  She has previous therapy.  Treatment Goals Addressed: Pt will recall the traumatic event without becoming too overwhelmed with emotions 50% of the time, evidenced by self report. Meet with clinician in person weekly for therapy to monitor for progress towards goals and address any barriers to success; Reduce depression from average severity level of 6/10 down to a 4/10 in next 90 days by engaging in 1-2 positive coping skills daily as part of developing self-care routine; Reduce average anxiety level from 7/10 down to 5/10 in next 90 days by utilizing 1-2 relaxation skills/grounding skills per day, such as mindful breathing, progressive muscle relaxation, positive visualizations.  Progression towards Goal: Progressing     Observations/Objective:  .Patient participated in a group discussion on Thanksgiving celebration and gratitudes. Pt shared current gratitudes. Pt was encouraged to identify gratitudes daily.     Collaboration of Care: Other: None at this session    Patient/Guardian was advised Release of Information must be obtained prior to any record release in order to collaborate their care with an  outside provider.   Patient/Guardian was advised if they have not already done so to contact the registration department to sign all necessary forms in order for [place] to release information regarding their care.   Consent: Patient/Guardian gives verbal consent for treatment and assignment of benefits for services provided during this visit. Patient/Guardian expressed understanding and agreed to proceed.    Assessment and Plan: Counselor will continue to meet with patient to address treatment plan goals. Patient will continue to follow recommendations of providers and implement skills learned in session and practice coping skills between  Sessions.Diagnosis: depression and ptsd     Follow Up Instructions: I discussed the assessment and treatment plan with the patient. The patient was provided an opportunity to ask questions and all were answered. The patient agreed with the plan and demonstrated an understanding of the plan.   The patient was advised to call back or seek an in-person evaluation if the symptoms worsen or if the condition fails to improve as anticipated.   I provided 90 minutes of non-face-to-face time during the group encounter.     Susan Guerra S, LCAS

## 2023-07-27 NOTE — Progress Notes (Signed)
Virtual GroupVisit via video Note   I connected with Susan Guerra on 07/27/23 at 5:00 pm during group session by video enabled telemedicine application and verified that I am speaking with the correct person using two identifiers.   Location: Patient: home Provider: home office   I discussed the limitations of evaluation and management by telemedicine and the availability of in person appointments. The patient expressed understanding and agreed to proceed.   History of Present Illness: Pt is referred to therapy for PTSD (MST) and MDD by the [facility]. Her biggest stressors are her health, taking care of her children (15 & 6) transitioning out of her 12 year relationship. Pt has fibromyalgia and other health issues, which affects her ability to take care of all her family's needs.  She has previous therapy.  Treatment Goals Addressed: Pt will recall the traumatic event without becoming too overwhelmed with emotions 50% of the time, evidenced by self report. Meet with clinician in person weekly for therapy to monitor for progress towards goals and address any barriers to success; Reduce depression from average severity level of 6/10 down to a 4/10 in next 90 days by engaging in 1-2 positive coping skills daily as part of developing self-care routine; Reduce average anxiety level from 7/10 down to 5/10 in next 90 days by utilizing 1-2 relaxation skills/grounding skills per day, such as mindful breathing, progressive muscle relaxation, positive visualizations.  Progression towards Goal: Progressing     Observations/Objective:  Patient participated in a group discussion on "tolerating the holidays" with family members. Patient described her holiday plans and coping skills to deal with feelings surrounding the holidays. Patient was encouraged to express her feelings in positive ways.  Collaboration of Care: Other: None at this session    Patient/Guardian was advised Release of Information  must be obtained prior to any record release in order to collaborate their care with an outside provider.   Patient/Guardian was advised if they have not already done so to contact the registration department to sign all necessary forms in order for [place] to release information regarding their care.   Consent: Patient/Guardian gives verbal consent for treatment and assignment of benefits for services provided during this visit. Patient/Guardian expressed understanding and agreed to proceed.    Assessment and Plan: Counselor will continue to meet with patient to address treatment plan goals. Patient will continue to follow recommendations of providers and implement skills learned in session and practice coping skills between  Sessions.Diagnosis: depression and ptsd     Follow Up Instructions: I discussed the assessment and treatment plan with the patient. The patient was provided an opportunity to ask questions and all were answered. The patient agreed with the plan and demonstrated an understanding of the plan.   The patient was advised to call back or seek an in-person evaluation if the symptoms worsen or if the condition fails to improve as anticipated.   I provided 90 minutes of non-face-to-face time during the group encounter.     Ceejay Kegley S, LCAS

## 2023-07-28 ENCOUNTER — Encounter (HOSPITAL_COMMUNITY): Payer: Self-pay | Admitting: Licensed Clinical Social Worker

## 2023-07-28 ENCOUNTER — Ambulatory Visit (HOSPITAL_COMMUNITY): Payer: No Typology Code available for payment source | Admitting: Licensed Clinical Social Worker

## 2023-07-28 DIAGNOSIS — F4310 Post-traumatic stress disorder, unspecified: Secondary | ICD-10-CM

## 2023-07-28 DIAGNOSIS — F331 Major depressive disorder, recurrent, moderate: Secondary | ICD-10-CM | POA: Diagnosis not present

## 2023-07-28 NOTE — Progress Notes (Unsigned)
Virtual Visit via video Note   I connected with Susan Guerra on 07/21/23 at 3-4pm by video enabled telemedicine application and verified that I am speaking with the correct person using two identifiers.   Location: Patient: home Provider: home office   I discussed the limitations of evaluation and management by telemedicine and the availability of in person appointments. The patient expressed understanding and agreed to proceed.   History of Present Illness: Pt is referred to therapy for PTSD (MST) and MDD by the [facility]. Her biggest stressors are her health, taking care of her children (15 & 6) transitioning out of her 12 year relationship. Pt has fibromyalgia and other health issues, which affects her ability to take care of all her family's needs.  She has previous therapy.  Treatment Goals Addressed: Pt will recall the traumatic event without becoming too overwhelmed with emotions 50% of the time, evidenced by self report. Meet with clinician in person weekly for therapy to monitor for progress towards goals and address any barriers to success; Reduce depression from average severity level of 6/10 down to a 4/10 in next 90 days by engaging in 1-2 positive coping skills daily as part of developing self-care routine; Reduce average anxiety level from 7/10 down to 5/10 in next 90 days by utilizing 1-2 relaxation skills/grounding skills per day, such as mindful breathing, progressive muscle relaxation, positive visualizations.  Progression towards Goal: Progressing      Observations/Objective: Patient presented for today's session on time and was alert, oriented x5, with no evidence or self-report of SI/HI or A/V H. Patient reported ongoing compliance with medication and denied any use of alcohol or illicit substances.  Clinician inquired about patient's current emotional ratings, as well as any significant changes in thoughts, feelings or behavior since previous session. Patient's  emotional ratings: 7/10 for depression, 7/10 for anxiety, 3/10 for anger/irritability, recalling traumatic event without becoming too overwhelmed with emotions 50% of the time. All ratings are evidenced by self report. Cln explored trauma emotional ratings with pt who identified her trauma triggers and coping skills used this past week. Pt provided updates on health, family, x relationship, new relationship, job, Sport and exercise psychologist. We discussed the impact these stressors have had on pt as well as how she coped with them.  "I have had a bad day emotionally." Cln asked open ended questions. Clinician provided thought stopping tools, as well as reality testing. Clinician utilized CBT to address thought processes support and confidence in her decisions.   Clinician explored emotional state and noted that having some crying spells was not an indication of being emotionally unwell.      Marland Kitchen           Collaboration of Care: Other: Continue with providers at [facility]  Patient/Guardian was advised Release of Information must be obtained prior to any record release in order to collaborate their care with an outside provider.   Patient/Guardian was advised if they have not already done so to contact the registration department to sign all necessary forms in order for [place] to release information regarding their care.   Consent: Patient/Guardian gives verbal consent for treatment and assignment of benefits for services provided during this visit. Patient/Guardian expressed understanding and agreed to proceed.  Assessment and Plan: Counselor will continue to meet with patient to address treatment plan goals. Patient will continue to follow recommendations of providers and implement skills learned in session and practice skills between sessions. Diagnosis: MDD and PTSD  Follow Up Instructions: I discussed the assessment and treatment plan with the patient. The patient was provided an opportunity to ask questions and all  were answered. The patient agreed with the plan and demonstrated an understanding of the plan.   The patient was advised to call back or seek an in-person evaluation if the symptoms worsen or if the condition fails to improve as anticipated.   I provided 60 minutes of non-face-to-face time during this encounter.     Kiauna Zywicki S, LCAS

## 2023-07-28 NOTE — Progress Notes (Signed)
Virtual Visit via video Note   I connected with Susan Guerra on 07/28/23 at 2-2:30pm by video enabled telemedicine application and verified that I am speaking with the correct person using two identifiers.   Location: Patient: home Provider: home office   I discussed the limitations of evaluation and management by telemedicine and the availability of in person appointments. The patient expressed understanding and agreed to proceed.   History of Present Illness: Pt is referred to therapy for PTSD (MST) and MDD by the [facility]. Her biggest stressors are her health, taking care of her children (15 & 6) transitioning out of her 12 year relationship. Pt has fibromyalgia and other health issues, which affects her ability to take care of all her family's needs.  She has previous therapy.  Treatment Goals Addressed: Pt will recall the traumatic event without becoming too overwhelmed with emotions 50% of the time, evidenced by self report. Meet with clinician in person weekly for therapy to monitor for progress towards goals and address any barriers to success; Reduce depression from average severity level of 6/10 down to a 4/10 in next 90 days by engaging in 1-2 positive coping skills daily as part of developing self-care routine; Reduce average anxiety level from 7/10 down to 5/10 in next 90 days by utilizing 1-2 relaxation skills/grounding skills per day, such as mindful breathing, progressive muscle relaxation, positive visualizations.  Progression towards Goal: Progressing      Observations/Objective: Patient presented for todays session on time and was alert, oriented x5, with no evidence or self-report of SI/HI or A/V H. Patient reported ongoing compliance with medication and denied any use of alcohol or illicit substances.  Clinician inquired about patients current emotional ratings, as well as any significant changes in thoughts, feelings or behavior since previous session. Patient's  emotional ratings: 7/10 for depression, 7/10 for anxiety, 3/10 for anger/irritability, recalling traumatic event without becoming too overwhelmed with emotions 50% of the time. All ratings are evidenced by self report. Cln explored trauma emotional ratings with pt who identified her trauma triggers and coping skills used this past week. Pt provided updates on health, family, x relationship, new relationship, job, Sport and exercise psychologist. We discussed the impact these stressors have had on pt as well as how she coped with them. Again, Cln provided psychoeducation sion on using mindfulness-based stress response to assist patient in lowering her stress response. Patient was encouraged to use mindfulness skills as a coping skill to assist in lowering her stress response to her many triggers and stressors.over the upcoming holidays.  Marland Kitchen           Collaboration of Care: Other: Continue with providers at [facility]  Patient/Guardian was advised Release of Information must be obtained prior to any record release in order to collaborate their care with an outside provider.   Patient/Guardian was advised if they have not already done so to contact the registration department to sign all necessary forms in order for [place] to release information regarding their care.   Consent: Patient/Guardian gives verbal consent for treatment and assignment of benefits for services provided during this visit. Patient/Guardian expressed understanding and agreed to proceed.  Assessment and Plan: Counselor will continue to meet with patient to address treatment plan goals. Patient will continue to follow recommendations of providers and implement skills learned in session and practice skills between sessions. Diagnosis: MDD and PTSD       Follow Up Instructions: I discussed the assessment and treatment plan with the patient. The patient  was provided an opportunity to ask questions and all were answered. The patient agreed with the plan and demonstrated  an understanding of the plan.   The patient was advised to call back or seek an in-person evaluation if the symptoms worsen or if the condition fails to improve as anticipated.   I provided 30 minutes of non-face-to-face time during this encounter.     Dontez Hauss S, LCAS

## 2023-08-03 ENCOUNTER — Encounter (HOSPITAL_COMMUNITY): Payer: Self-pay | Admitting: Licensed Clinical Social Worker

## 2023-08-03 ENCOUNTER — Ambulatory Visit (INDEPENDENT_AMBULATORY_CARE_PROVIDER_SITE_OTHER): Payer: No Typology Code available for payment source | Admitting: Licensed Clinical Social Worker

## 2023-08-03 DIAGNOSIS — F329 Major depressive disorder, single episode, unspecified: Secondary | ICD-10-CM | POA: Diagnosis not present

## 2023-08-03 DIAGNOSIS — F4310 Post-traumatic stress disorder, unspecified: Secondary | ICD-10-CM | POA: Diagnosis not present

## 2023-08-03 DIAGNOSIS — F331 Major depressive disorder, recurrent, moderate: Secondary | ICD-10-CM

## 2023-08-03 NOTE — Progress Notes (Signed)
Virtual GroupVisit via video Note   I connected with Susan Guerra on 08/03/23 at 5:00 pm during group session by video enabled telemedicine application and verified that I am speaking with the correct person using two identifiers.   Location: Patient: home Provider: home office   I discussed the limitations of evaluation and management by telemedicine and the availability of in person appointments. The patient expressed understanding and agreed to proceed.   History of Present Illness: Pt is referred to therapy for PTSD (MST) and MDD by the [facility]. Her biggest stressors are her health, taking care of her children (15 & 6) transitioning out of her 12 year relationship. Pt has fibromyalgia and other health issues, which affects her ability to take care of all her family's needs.  She has previous therapy.  Treatment Goals Addressed: Pt will recall the traumatic event without becoming too overwhelmed with emotions 50% of the time, evidenced by self report. Meet with clinician in person weekly for therapy to monitor for progress towards goals and address any barriers to success; Reduce depression from average severity level of 6/10 down to a 4/10 in next 90 days by engaging in 1-2 positive coping skills daily as part of developing self-care routine; Reduce average anxiety level from 7/10 down to 5/10 in next 90 days by utilizing 1-2 relaxation skills/grounding skills per day, such as mindful breathing, progressive muscle relaxation, positive visualizations.  Progression towards Goal: Progressing     Observations/Objective:  . Patient participated in a group discussion on "tolerating the holidays" with family members. Patient described hwe upcoming holiday plans and coping skills to deal with feelings surrounding the holidays. Patient was encouraged to express her feelings in positive ways.    Collaboration of Care: Other: None at this session    Patient/Guardian was advised Release  of Information must be obtained prior to any record release in order to collaborate their care with an outside provider.   Patient/Guardian was advised if they have not already done so to contact the registration department to sign all necessary forms in order for [place] to release information regarding their care.   Consent: Patient/Guardian gives verbal consent for treatment and assignment of benefits for services provided during this visit. Patient/Guardian expressed understanding and agreed to proceed.    Assessment and Plan: Counselor will continue to meet with patient to address treatment plan goals. Patient will continue to follow recommendations of providers and implement skills learned in session and practice coping skills between  Sessions.Diagnosis: depression and ptsd     Follow Up Instructions: I discussed the assessment and treatment plan with the patient. The patient was provided an opportunity to ask questions and all were answered. The patient agreed with the plan and demonstrated an understanding of the plan.   The patient was advised to call back or seek an in-person evaluation if the symptoms worsen or if the condition fails to improve as anticipated.   I provided 90 minutes of non-face-to-face time during the group encounter.     Mattheo Swindle S, LCAS

## 2023-08-04 ENCOUNTER — Ambulatory Visit (HOSPITAL_COMMUNITY): Payer: No Typology Code available for payment source | Admitting: Licensed Clinical Social Worker

## 2023-08-04 DIAGNOSIS — F331 Major depressive disorder, recurrent, moderate: Secondary | ICD-10-CM

## 2023-08-04 DIAGNOSIS — F4310 Post-traumatic stress disorder, unspecified: Secondary | ICD-10-CM

## 2023-08-04 DIAGNOSIS — F329 Major depressive disorder, single episode, unspecified: Secondary | ICD-10-CM

## 2023-08-10 ENCOUNTER — Ambulatory Visit (HOSPITAL_COMMUNITY): Payer: Medicaid Other | Admitting: Licensed Clinical Social Worker

## 2023-08-11 ENCOUNTER — Ambulatory Visit (HOSPITAL_COMMUNITY): Payer: No Typology Code available for payment source | Admitting: Licensed Clinical Social Worker

## 2023-08-11 ENCOUNTER — Telehealth (HOSPITAL_COMMUNITY): Payer: Self-pay

## 2023-08-11 NOTE — Telephone Encounter (Signed)
Appointment - Attempted to contact patient to inform her therapist was out sick today and to reschedule. Could not leave a message on phone number and no answer.

## 2023-08-17 ENCOUNTER — Ambulatory Visit (INDEPENDENT_AMBULATORY_CARE_PROVIDER_SITE_OTHER): Payer: No Typology Code available for payment source | Admitting: Licensed Clinical Social Worker

## 2023-08-17 DIAGNOSIS — F331 Major depressive disorder, recurrent, moderate: Secondary | ICD-10-CM

## 2023-08-17 DIAGNOSIS — F4310 Post-traumatic stress disorder, unspecified: Secondary | ICD-10-CM | POA: Diagnosis not present

## 2023-08-18 ENCOUNTER — Ambulatory Visit (HOSPITAL_COMMUNITY): Payer: No Typology Code available for payment source | Admitting: Licensed Clinical Social Worker

## 2023-08-18 ENCOUNTER — Encounter (HOSPITAL_COMMUNITY): Payer: Self-pay | Admitting: Licensed Clinical Social Worker

## 2023-08-18 ENCOUNTER — Ambulatory Visit (INDEPENDENT_AMBULATORY_CARE_PROVIDER_SITE_OTHER): Payer: No Typology Code available for payment source | Admitting: Licensed Clinical Social Worker

## 2023-08-18 DIAGNOSIS — F331 Major depressive disorder, recurrent, moderate: Secondary | ICD-10-CM | POA: Diagnosis not present

## 2023-08-18 DIAGNOSIS — F4310 Post-traumatic stress disorder, unspecified: Secondary | ICD-10-CM | POA: Diagnosis not present

## 2023-08-22 ENCOUNTER — Encounter (HOSPITAL_COMMUNITY): Payer: Self-pay | Admitting: Licensed Clinical Social Worker

## 2023-08-24 ENCOUNTER — Ambulatory Visit (HOSPITAL_COMMUNITY): Payer: No Typology Code available for payment source | Admitting: Licensed Clinical Social Worker

## 2023-08-24 ENCOUNTER — Encounter (HOSPITAL_COMMUNITY): Payer: Self-pay | Admitting: Licensed Clinical Social Worker

## 2023-08-24 NOTE — Progress Notes (Signed)
 Virtual Visit via video Note   I connected with Susan Guerra on 08/18/23 at 10-11am by video enabled telemedicine application and verified that I am speaking with the correct person using two identifiers.   Location: Patient: home Provider: home office   I discussed the limitations of evaluation and management by telemedicine and the availability of in person appointments. The patient expressed understanding and agreed to proceed.   History of Present Illness: Pt is referred to therapy for PTSD (MST) and MDD by the [facility]. Her biggest stressors are her health, taking care of her children (15 & 6) transitioning out of her 12 year relationship. Pt has fibromyalgia and other health issues, which affects her ability to take care of all her family's needs.  She has previous therapy.  Treatment Goals Addressed: Pt will recall the traumatic event without becoming too overwhelmed with emotions 50% of the time, evidenced by self report. Meet with clinician in person weekly for therapy to monitor for progress towards goals and address any barriers to success; Reduce depression from average severity level of 6/10 down to a 4/10 in next 90 days by engaging in 1-2 positive coping skills daily as part of developing self-care routine; Reduce average anxiety level from 7/10 down to 5/10 in next 90 days by utilizing 1-2 relaxation skills/grounding skills per day, such as mindful breathing, progressive muscle relaxation, positive visualizations.  Progression towards Goal: Progressing      Observations/Objective: Patient presented for today's session on time and was alert, oriented x5, with no evidence or self-report of SI/HI or A/V H. Patient reported ongoing compliance with medication and denied any use of alcohol or illicit substances.  Clinician inquired about patient's current emotional ratings, as well as any significant changes in thoughts, feelings or behavior since previous session. Patient's  emotional ratings: 7/10 for depression, 7/10 for anxiety, 3/10 for anger/irritability, recalling traumatic event without becoming too overwhelmed with emotions 50% of the time. All ratings are evidenced by self report. Cln explored trauma emotional ratings with pt who identified her trauma triggers and coping skills used this past week. 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 We discussed the impact these stressors have had on pt as well as how she coped with them.  Cln provided psychoeducation on practicing self-awareness which can help her to recognize patterns in her emotions, including events or situations that can trigger worsened symptoms.          Collaboration of Care: Other: Continue with providers at [facility]  Patient/Guardian was advised Release of Information must be obtained prior to any record release in order to collaborate their care with an outside provider.   Patient/Guardian was advised if they have not already done so to contact the registration department to sign all necessary forms in order for us  to release information regarding their care.   Consent: Patient/Guardian gives verbal consent for treatment and assignment of benefits for services provided during this visit. Patient/Guardian expressed understanding and agreed to proceed.  Assessment and Plan: Counselor will continue to meet with patient to address treatment plan goals. Patient will continue to follow recommendations of providers and implement skills learned in session and practice skills between sessions. Diagnosis: MDD and PTSD       Follow Up Instructions: I discussed the assessment and treatment plan with the patient. The patient was provided an opportunity to ask questions and all were answered. The patient agreed with the plan and demonstrated an understanding of  the plan.   The patient was advised to call back or seek an in-person evaluation if the symptoms worsen  or if the condition fails to improve as anticipated.   I provided 60 minutes of non-face-to-face time during this encounter.     Vasilis Luhman S, LCAS

## 2023-08-25 ENCOUNTER — Encounter (HOSPITAL_COMMUNITY): Payer: Self-pay | Admitting: Licensed Clinical Social Worker

## 2023-08-25 ENCOUNTER — Ambulatory Visit (INDEPENDENT_AMBULATORY_CARE_PROVIDER_SITE_OTHER): Payer: No Typology Code available for payment source | Admitting: Licensed Clinical Social Worker

## 2023-08-25 DIAGNOSIS — F331 Major depressive disorder, recurrent, moderate: Secondary | ICD-10-CM

## 2023-08-25 DIAGNOSIS — F4310 Post-traumatic stress disorder, unspecified: Secondary | ICD-10-CM

## 2023-08-25 DIAGNOSIS — F329 Major depressive disorder, single episode, unspecified: Secondary | ICD-10-CM | POA: Diagnosis not present

## 2023-08-31 ENCOUNTER — Ambulatory Visit (HOSPITAL_COMMUNITY): Payer: No Typology Code available for payment source | Admitting: Licensed Clinical Social Worker

## 2023-08-31 ENCOUNTER — Encounter (HOSPITAL_COMMUNITY): Payer: Self-pay | Admitting: Licensed Clinical Social Worker

## 2023-08-31 DIAGNOSIS — F331 Major depressive disorder, recurrent, moderate: Secondary | ICD-10-CM

## 2023-08-31 DIAGNOSIS — F4310 Post-traumatic stress disorder, unspecified: Secondary | ICD-10-CM | POA: Diagnosis not present

## 2023-08-31 DIAGNOSIS — F32A Depression, unspecified: Secondary | ICD-10-CM

## 2023-08-31 NOTE — Progress Notes (Signed)
 Virtual GroupVisit via video Note   I connected with Susan Guerra on 08/31/23 at 5:00 pm during group session by video enabled telemedicine application and verified that I am speaking with the correct person using two identifiers.   Location: Patient: home Provider: home office   I discussed the limitations of evaluation and management by telemedicine and the availability of in person appointments. The patient expressed understanding and agreed to proceed.   History of Present Illness: Pt is referred to therapy for PTSD (MST) and MDD by the [facility]. Her biggest stressors are her health, taking care of her children (15 & 6) transitioning out of her 12 year relationship. Pt has fibromyalgia and other health issues, which affects her ability to take care of all her family'Guerra needs.  She has previous therapy.  Treatment Goals Addressed: Pt will recall the traumatic event without becoming too overwhelmed with emotions 50% of the time, evidenced by self report. Meet with clinician in person weekly for therapy to monitor for progress towards goals and address any barriers to success; Reduce depression from average severity level of 6/10 down to a 4/10 in next 90 days by engaging in 1-2 positive coping skills daily as part of developing self-care routine; Reduce average anxiety level from 7/10 down to 5/10 in next 90 days by utilizing 1-2 relaxation skills/grounding skills per day, such as mindful breathing, progressive muscle relaxation, positive visualizations.  Progression towards Goal: Progressing     Observations/Objective:  Patient participated in a group on making SMART goals for the new year of 2025. Clinician provided education on goal setting where SMART goals help focus efforts and increases the chances of achieving goals.  Patient was encouraged to identify personal goals for the new year of 2025.    Collaboration of Care: Other: None at this session    Patient/Guardian was  advised Release of Information must be obtained prior to any record release in order to collaborate their care with an outside provider.   Patient/Guardian was advised if they have not already done so to contact the registration department to sign all necessary forms in order for us  to release information regarding their care.   Consent: Patient/Guardian gives verbal consent for treatment and assignment of benefits for services provided during this visit. Patient/Guardian expressed understanding and agreed to proceed.    Assessment and Plan: Counselor will continue to meet with patient to address treatment plan goals. Patient will continue to follow recommendations of providers and implement skills learned in session and practice coping skills between  Sessions.Diagnosis: depression and ptsd     Follow Up Instructions: I discussed the assessment and treatment plan with the patient. The patient was provided an opportunity to ask questions and all were answered. The patient agreed with the plan and demonstrated an understanding of the plan.   The patient was advised to call back or seek an in-person evaluation if the symptoms worsen or if the condition fails to improve as anticipated.   I provided 90 minutes of non-face-to-face time during the group encounter.     Susan Guerra, LCAS

## 2023-09-01 ENCOUNTER — Ambulatory Visit (HOSPITAL_COMMUNITY): Payer: No Typology Code available for payment source | Admitting: Licensed Clinical Social Worker

## 2023-09-01 DIAGNOSIS — F331 Major depressive disorder, recurrent, moderate: Secondary | ICD-10-CM | POA: Diagnosis not present

## 2023-09-01 DIAGNOSIS — F4310 Post-traumatic stress disorder, unspecified: Secondary | ICD-10-CM | POA: Diagnosis not present

## 2023-09-06 ENCOUNTER — Encounter (HOSPITAL_COMMUNITY): Payer: Self-pay | Admitting: Licensed Clinical Social Worker

## 2023-09-06 NOTE — Progress Notes (Signed)
Virtual Visit via video Note   I connected with Susan Guerra on 08/25/23 at 10-11am by video enabled telemedicine application and verified that I am speaking with the correct person using two identifiers.   Location: Patient: home Provider: home office   I discussed the limitations of evaluation and management by telemedicine and the availability of in person appointments. The patient expressed understanding and agreed to proceed.   History of Present Illness: Pt is referred to therapy for PTSD (MST) and MDD by the [facility]. Her biggest stressors are her health, taking care of her children (15 & 6) transitioning out of her 12 year relationship. Pt has fibromyalgia and other health issues, which affects her ability to take care of all her family's needs.  She has previous therapy.  Treatment Goals Addressed: Pt will recall the traumatic event without becoming too overwhelmed with emotions 50% of the time, evidenced by self report. Meet with clinician in person weekly for therapy to monitor for progress towards goals and address any barriers to success; Reduce depression from average severity level of 6/10 down to a 4/10 in next 90 days by engaging in 1-2 positive coping skills daily as part of developing self-care routine; Reduce average anxiety level from 7/10 down to 5/10 in next 90 days by utilizing 1-2 relaxation skills/grounding skills per day, such as mindful breathing, progressive muscle relaxation, positive visualizations.  Progression towards Goal: Progressing      Observations/Objective: Patient presented for today's session on time and was alert, oriented x5, with no evidence or self-report of SI/HI or A/V H. Patient reported ongoing compliance with medication and denied any use of alcohol or illicit substances.  Clinician inquired about patient's current emotional ratings, as well as any significant changes in thoughts, feelings or behavior since previous session. Patient's  emotional ratings: 7/10 for depression, 7/10 for anxiety, 3/10 for anger/irritability, recalling traumatic event without becoming too overwhelmed with emotions 50% of the time. All ratings are evidenced by self report. Cln explored trauma emotional ratings with pt who identified her trauma triggers and coping skills used this past week. Pt provided updates on health, family, x relationship, new relationship, job, Sport and exercise psychologist. We discussed the impact these stressors have had on pt as well as how she coped with them.  Cln used CBT to assist in identifying positives to assess how her mood has been impacted. Clinician allowed space for patient to further process emotions and clinician provided supportive statements throughout session.           Collaboration of Care: Other: Continue with providers at [facility]  Patient/Guardian was advised Release of Information must be obtained prior to any record release in order to collaborate their care with an outside provider.   Patient/Guardian was advised if they have not already done so to contact the registration department to sign all necessary forms in order for [place] to release information regarding their care.   Consent: Patient/Guardian gives verbal consent for treatment and assignment of benefits for services provided during this visit. Patient/Guardian expressed understanding and agreed to proceed.  Assessment and Plan: Counselor will continue to meet with patient to address treatment plan goals. Patient will continue to follow recommendations of providers and implement skills learned in session and practice skills between sessions. Diagnosis: MDD and PTSD       Follow Up Instructions: I discussed the assessment and treatment plan with the patient. The patient was provided an opportunity to ask questions and all were answered. The patient agreed  with the plan and demonstrated an understanding of the plan.   The patient was advised to call back or seek an  in-person evaluation if the symptoms worsen or if the condition fails to improve as anticipated.   I provided 60 minutes of non-face-to-face time during this encounter.     Devanie Galanti S, LCAS

## 2023-09-07 ENCOUNTER — Ambulatory Visit (INDEPENDENT_AMBULATORY_CARE_PROVIDER_SITE_OTHER): Payer: No Typology Code available for payment source | Admitting: Licensed Clinical Social Worker

## 2023-09-07 ENCOUNTER — Encounter (HOSPITAL_COMMUNITY): Payer: Self-pay | Admitting: Licensed Clinical Social Worker

## 2023-09-07 DIAGNOSIS — F32A Depression, unspecified: Secondary | ICD-10-CM

## 2023-09-07 DIAGNOSIS — F4310 Post-traumatic stress disorder, unspecified: Secondary | ICD-10-CM

## 2023-09-07 DIAGNOSIS — F331 Major depressive disorder, recurrent, moderate: Secondary | ICD-10-CM

## 2023-09-07 NOTE — Progress Notes (Signed)
Virtual GroupVisit via video Note   I connected with Susan Guerra on 09/07/23 at 5:00-6:30 pm during group session by video enabled telemedicine application and verified that I am speaking with the correct person using two identifiers.   Location: Patient: home Provider: home office   I discussed the limitations of evaluation and management by telemedicine and the availability of in person appointments. The patient expressed understanding and agreed to proceed.   History of Present Illness: Pt is referred to therapy for PTSD (MST) and MDD by the [facility]. Her biggest stressors are her health, taking care of her children (15 & 6) transitioning out of her 12 year relationship. Pt has fibromyalgia and other health issues, which affects her ability to take care of all her family's needs.  She has previous therapy.  Treatment Goals Addressed: Pt will recall the traumatic event without becoming too overwhelmed with emotions 50% of the time, evidenced by self report. Meet with clinician in person weekly for therapy to monitor for progress towards goals and address any barriers to success; Reduce depression from average severity level of 6/10 down to a 4/10 in next 90 days by engaging in 1-2 positive coping skills daily as part of developing self-care routine; Reduce average anxiety level from 7/10 down to 5/10 in next 90 days by utilizing 1-2 relaxation skills/grounding skills per day, such as mindful breathing, progressive muscle relaxation, positive visualizations.  Progression towards Goal: Progressing     Observations/Objective:  Patient participated in a group discussion on sense of self-worth, valuing self, and having a sense of feeling worthy. Patient was encouraged to acknowledge the inner critic and practice positive affirmations.      Collaboration of Care: Other: None at this session    Patient/Guardian was advised Release of Information must be obtained prior to any record  release in order to collaborate their care with an outside provider.   Patient/Guardian was advised if they have not already done so to contact the registration department to sign all necessary forms in order for [place] to release information regarding their care.   Consent: Patient/Guardian gives verbal consent for treatment and assignment of benefits for services provided during this visit. Patient/Guardian expressed understanding and agreed to proceed.    Assessment and Plan: Counselor will continue to meet with patient to address treatment plan goals. Patient will continue to follow recommendations of providers and implement skills learned in session and practice coping skills between  Sessions.Diagnosis: depression and ptsd     Follow Up Instructions: I discussed the assessment and treatment plan with the patient. The patient was provided an opportunity to ask questions and all were answered. The patient agreed with the plan and demonstrated an understanding of the plan.   The patient was advised to call back or seek an in-person evaluation if the symptoms worsen or if the condition fails to improve as anticipated.   I provided 90 minutes of non-face-to-face time during the group encounter.     Chelcey Caputo S, LCAS

## 2023-09-08 ENCOUNTER — Encounter (HOSPITAL_COMMUNITY): Payer: Self-pay | Admitting: Licensed Clinical Social Worker

## 2023-09-08 ENCOUNTER — Ambulatory Visit (INDEPENDENT_AMBULATORY_CARE_PROVIDER_SITE_OTHER): Payer: No Typology Code available for payment source | Admitting: Licensed Clinical Social Worker

## 2023-09-08 DIAGNOSIS — F4310 Post-traumatic stress disorder, unspecified: Secondary | ICD-10-CM | POA: Diagnosis not present

## 2023-09-08 DIAGNOSIS — F331 Major depressive disorder, recurrent, moderate: Secondary | ICD-10-CM

## 2023-09-10 ENCOUNTER — Encounter (HOSPITAL_COMMUNITY): Payer: Self-pay | Admitting: Licensed Clinical Social Worker

## 2023-09-10 NOTE — Progress Notes (Signed)
Virtual Visit via video Note   I connected with Susan Guerra on 09/08/23 at 10-11am by video enabled telemedicine application and verified that I am speaking with the correct person using two identifiers.   Location: Patient: home Provider: home office   I discussed the limitations of evaluation and management by telemedicine and the availability of in person appointments. The patient expressed understanding and agreed to proceed.   History of Present Illness: Pt is referred to therapy for PTSD (MST) and MDD by the [facility]. Her biggest stressors are her health, taking care of her children (15 & 6) transitioning out of her 12 year relationship. Pt has fibromyalgia and other health issues, which affects her ability to take care of all her family's needs.  She has previous therapy.  Treatment Goals Addressed: Pt will recall the traumatic event without becoming too overwhelmed with emotions 50% of the time, evidenced by self report. Meet with clinician in person weekly for therapy to monitor for progress towards goals and address any barriers to success; Reduce depression from average severity level of 6/10 down to a 4/10 in next 90 days by engaging in 1-2 positive coping skills daily as part of developing self-care routine; Reduce average anxiety level from 7/10 down to 5/10 in next 90 days by utilizing 1-2 relaxation skills/grounding skills per day, such as mindful breathing, progressive muscle relaxation, positive visualizations.  Progression towards Goal: Progressing      Observations/Objective: Patient presented for today's session on time and was alert, oriented x5, with no evidence or self-report of SI/HI or A/V H. Patient reported ongoing compliance with medication and denied any use of alcohol or illicit substances.  Clinician inquired about patient's current emotional ratings, as well as any significant changes in thoughts, feelings or behavior since previous session. Patient's  emotional ratings: 5/10 for depression, 5/10 for anxiety, 3/10 for anger/irritability, recalling traumatic event without becoming too overwhelmed with emotions 50% of the time. All ratings are evidenced by self report. Cln explored trauma emotional ratings with pt who identified her trauma triggers and coping skills used this past week. Pt provided updates on health, family, x relationship, new relationship, job, Sport and exercise psychologist. We discussed the impact these stressors have had on pt as well as how she coped with them over the past week. Cln and pt reviewed her word for the year: Saying no. Cln provided psychoeducation on the importance of self care, putting self first, compartmentalizing.          Collaboration of Care: Other: Continue with providers at [facility]  Patient/Guardian was advised Release of Information must be obtained prior to any record release in order to collaborate their care with an outside provider.   Patient/Guardian was advised if they have not already done so to contact the registration department to sign all necessary forms in order for [place] to release information regarding their care.   Consent: Patient/Guardian gives verbal consent for treatment and assignment of benefits for services provided during this visit. Patient/Guardian expressed understanding and agreed to proceed.  Assessment and Plan: Counselor will continue to meet with patient to address treatment plan goals. Patient will continue to follow recommendations of providers and implement skills learned in session and practice skills between sessions. Diagnosis: MDD and PTSD       Follow Up Instructions: I discussed the assessment and treatment plan with the patient. The patient was provided an opportunity to ask questions and all were answered. The patient agreed with the plan and demonstrated an  understanding of the plan.   The patient was advised to call back or seek an in-person evaluation if the symptoms worsen or if the  condition fails to improve as anticipated.   I provided 60 minutes of non-face-to-face time during this encounter.     Taren Toops S, LCAS

## 2023-09-10 NOTE — Progress Notes (Signed)
 Virtual Visit via video Note   I connected with Susan Guerra on 09/01/23 at 10-11am by video enabled telemedicine application and verified that I am speaking with the correct person using two identifiers.   Location: Patient: home Provider: home office   I discussed the limitations of evaluation and management by telemedicine and the availability of in person appointments. The patient expressed understanding and agreed to proceed.   History of Present Illness: Pt is referred to therapy for PTSD (MST) and MDD by the [facility]. Her biggest stressors are her health, taking care of her children (15 & 6) transitioning out of her 12 year relationship. Pt has fibromyalgia and other health issues, which affects her ability to take care of all her family's needs.  She has previous therapy.  Treatment Goals Addressed: Pt will recall the traumatic event without becoming too overwhelmed with emotions 50% of the time, evidenced by self report. Meet with clinician in person weekly for therapy to monitor for progress towards goals and address any barriers to success; Reduce depression from average severity level of 6/10 down to a 4/10 in next 90 days by engaging in 1-2 positive coping skills daily as part of developing self-care routine; Reduce average anxiety level from 7/10 down to 5/10 in next 90 days by utilizing 1-2 relaxation skills/grounding skills per day, such as mindful breathing, progressive muscle relaxation, positive visualizations.  Progression towards Goal: Progressing      Observations/Objective: Patient presented for today's session on time and was alert, oriented x5, with no evidence or self-report of SI/HI or A/V H. Patient reported ongoing compliance with medication and denied any use of alcohol or illicit substances.  Clinician inquired about patient's current emotional ratings, as well as any significant changes in thoughts, feelings or behavior since previous session. Patient's  emotional ratings: 7/10 for depression, 7/10 for anxiety, 3/10 for anger/irritability, recalling traumatic event without becoming too overwhelmed with emotions 50% of the time. All ratings are evidenced by self report. Cln explored trauma emotional ratings with pt who identified her trauma triggers and coping skills used this past week. 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 We discussed the impact these stressors have had on pt as well as how she coped with them.  Clinician utilized CBT to process concerns, worried and work challenges. Clinician processed thoughts, feelings, and behaviors. Clinician discussed coping skills and options for supporting herself through these challenging times.          Collaboration of Care: Other: Continue with providers at [facility]  Patient/Guardian was advised Release of Information must be obtained prior to any record release in order to collaborate their care with an outside provider.   Patient/Guardian was advised if they have not already done so to contact the registration department to sign all necessary forms in order for us  to release information regarding their care.   Consent: Patient/Guardian gives verbal consent for treatment and assignment of benefits for services provided during this visit. Patient/Guardian expressed understanding and agreed to proceed.  Assessment and Plan: Counselor will continue to meet with patient to address treatment plan goals. Patient will continue to follow recommendations of providers and implement skills learned in session and practice skills between sessions. Diagnosis: MDD and PTSD       Follow Up Instructions: I discussed the assessment and treatment plan with the patient. The patient was provided an opportunity to ask questions and all were answered. The patient agreed with the plan and  demonstrated an understanding of the plan.   The patient was advised to call back or seek  an in-person evaluation if the symptoms worsen or if the condition fails to improve as anticipated.   I provided 60 minutes of non-face-to-face time during this encounter.     Dymin Dingledine S, LCAS

## 2023-09-14 ENCOUNTER — Encounter (HOSPITAL_COMMUNITY): Payer: Self-pay | Admitting: Licensed Clinical Social Worker

## 2023-09-14 ENCOUNTER — Ambulatory Visit (HOSPITAL_COMMUNITY): Payer: No Typology Code available for payment source | Admitting: Licensed Clinical Social Worker

## 2023-09-14 DIAGNOSIS — F331 Major depressive disorder, recurrent, moderate: Secondary | ICD-10-CM

## 2023-09-14 DIAGNOSIS — F4310 Post-traumatic stress disorder, unspecified: Secondary | ICD-10-CM | POA: Diagnosis not present

## 2023-09-14 NOTE — Progress Notes (Signed)
Virtual GroupVisit via video Note   I connected with Susan Guerra on 09/14/23 at 5:00-6:30 pm during group session by video enabled telemedicine application and verified that I am speaking with the correct person using two identifiers.   Location: Patient: home Provider: home office   I discussed the limitations of evaluation and management by telemedicine and the availability of in person appointments. The patient expressed understanding and agreed to proceed.   History of Present Illness: Pt is referred to therapy for PTSD (MST) and MDD by the [facility]. Her biggest stressors are her health, taking care of her children (15 & 6) transitioning out of her 12 year relationship. Pt has fibromyalgia and other health issues, which affects her ability to take care of all her family's needs.  She has previous therapy.  Treatment Goals Addressed: Pt will recall the traumatic event without becoming too overwhelmed with emotions 50% of the time, evidenced by self report. Meet with clinician in person weekly for therapy to monitor for progress towards goals and address any barriers to success; Reduce depression from average severity level of 6/10 down to a 4/10 in next 90 days by engaging in 1-2 positive coping skills daily as part of developing self-care routine; Reduce average anxiety level from 7/10 down to 5/10 in next 90 days by utilizing 1-2 relaxation skills/grounding skills per day, such as mindful breathing, progressive muscle relaxation, positive visualizations.  Progression towards Goal: Progressing     Observations/Objective:  Patient participated in a discussion on relationships, quantity vs quality, what's important: listening, communicating with openness and honesty, sharing similar values which creates a sense of belonging and bonding. Patient was encouraged to continue to grow within relationships.     Collaboration of Care: Other: None at this session    Patient/Guardian was  advised Release of Information must be obtained prior to any record release in order to collaborate their care with an outside provider.   Patient/Guardian was advised if they have not already done so to contact the registration department to sign all necessary forms in order for [place] to release information regarding their care.   Consent: Patient/Guardian gives verbal consent for treatment and assignment of benefits for services provided during this visit. Patient/Guardian expressed understanding and agreed to proceed.    Assessment and Plan: Counselor will continue to meet with patient to address treatment plan goals. Patient will continue to follow recommendations of providers and implement skills learned in session and practice coping skills between  Sessions.Diagnosis: depression and ptsd     Follow Up Instructions: I discussed the assessment and treatment plan with the patient. The patient was provided an opportunity to ask questions and all were answered. The patient agreed with the plan and demonstrated an understanding of the plan.   The patient was advised to call back or seek an in-person evaluation if the symptoms worsen or if the condition fails to improve as anticipated.   I provided 90 minutes of non-face-to-face time during the group encounter.     Susan Guerra S, LCAS

## 2023-09-15 ENCOUNTER — Ambulatory Visit (INDEPENDENT_AMBULATORY_CARE_PROVIDER_SITE_OTHER): Payer: No Typology Code available for payment source | Admitting: Licensed Clinical Social Worker

## 2023-09-15 DIAGNOSIS — F4310 Post-traumatic stress disorder, unspecified: Secondary | ICD-10-CM | POA: Diagnosis not present

## 2023-09-15 DIAGNOSIS — F331 Major depressive disorder, recurrent, moderate: Secondary | ICD-10-CM | POA: Diagnosis not present

## 2023-09-18 ENCOUNTER — Encounter (HOSPITAL_COMMUNITY): Payer: Self-pay | Admitting: Licensed Clinical Social Worker

## 2023-09-18 NOTE — Progress Notes (Signed)
Virtual Visit via video Note   I connected with Susan Guerra on 09/15/23 at 10-11am by video enabled telemedicine application and verified that I am speaking with the correct person using two identifiers.   Location: Patient: home Provider: home office   I discussed the limitations of evaluation and management by telemedicine and the availability of in person appointments. The patient expressed understanding and agreed to proceed.   History of Present Illness: Pt is referred to therapy for PTSD (MST) and MDD by the [facility]. Her biggest stressors are her health, taking care of her children (15 & 6) transitioning out of her 12 year relationship. Pt has fibromyalgia and other health issues, which affects her ability to take care of all her family's needs.  She has previous therapy.  Treatment Goals Addressed: Pt will recall the traumatic event without becoming too overwhelmed with emotions 50% of the time, evidenced by self report. Meet with clinician in person weekly for therapy to monitor for progress towards goals and address any barriers to success; Reduce depression from average severity level of 6/10 down to a 4/10 in next 90 days by engaging in 1-2 positive coping skills daily as part of developing self-care routine; Reduce average anxiety level from 7/10 down to 5/10 in next 90 days by utilizing 1-2 relaxation skills/grounding skills per day, such as mindful breathing, progressive muscle relaxation, positive visualizations.  Progression towards Goal: Progressing      Observations/Objective: Patient presented for today's session on time and was alert, oriented x5, with no evidence or self-report of SI/HI or A/V H. Patient reported ongoing compliance with medication and denied any use of alcohol or illicit substances.  Clinician inquired about patient's current emotional ratings, as well as any significant changes in thoughts, feelings or behavior since previous session. Patient's  emotional ratings: 5/10 for depression, 5/10 for anxiety, 3/10 for anger/irritability, recalling traumatic event without becoming too overwhelmed with emotions 50% of the time. All ratings are evidenced by self report. Cln explored trauma emotional ratings with pt who identified her trauma triggers and coping skills used this past week. Pt provided updates on health, family, x relationship, new relationship, job, Sport and exercise psychologist. We discussed the impact these stressors have had on pt as well as how she coped with them over the past week. Cln and pt reviewed her word for the year: "Saying no," and how that is affecting her life. Cln and pt participated in a discussion on using mindfulness-based stress response to assist her in lowering her stress response. Patient was encouraged to use mindfulness skills as a coping skill to assist in lowering stress response.        Collaboration of Care: Other: Continue with providers at [facility]  Patient/Guardian was advised Release of Information must be obtained prior to any record release in order to collaborate their care with an outside provider.   Patient/Guardian was advised if they have not already done so to contact the registration department to sign all necessary forms in order for [place] to release information regarding their care.   Consent: Patient/Guardian gives verbal consent for treatment and assignment of benefits for services provided during this visit. Patient/Guardian expressed understanding and agreed to proceed.  Assessment and Plan: Counselor will continue to meet with patient to address treatment plan goals. Patient will continue to follow recommendations of providers and implement skills learned in session and practice skills between sessions. Diagnosis: MDD and PTSD       Follow Up Instructions: I discussed the  assessment and treatment plan with the patient. The patient was provided an opportunity to ask questions and all were answered. The patient agreed  with the plan and demonstrated an understanding of the plan.   The patient was advised to call back or seek an in-person evaluation if the symptoms worsen or if the condition fails to improve as anticipated.   I provided 60 minutes of non-face-to-face time during this encounter.     Brek Reece S, LCAS

## 2023-09-21 ENCOUNTER — Encounter (HOSPITAL_COMMUNITY): Payer: Self-pay | Admitting: Licensed Clinical Social Worker

## 2023-09-21 ENCOUNTER — Ambulatory Visit (INDEPENDENT_AMBULATORY_CARE_PROVIDER_SITE_OTHER): Payer: No Typology Code available for payment source | Admitting: Licensed Clinical Social Worker

## 2023-09-21 DIAGNOSIS — F4310 Post-traumatic stress disorder, unspecified: Secondary | ICD-10-CM

## 2023-09-21 DIAGNOSIS — F331 Major depressive disorder, recurrent, moderate: Secondary | ICD-10-CM

## 2023-09-21 DIAGNOSIS — F32A Depression, unspecified: Secondary | ICD-10-CM

## 2023-09-21 NOTE — Progress Notes (Signed)
Virtual GroupVisit via video Note   I connected with Susan Guerra on 09/21/23 at 5:00-6:30 pm during group session by video enabled telemedicine application and verified that I am speaking with the correct person using two identifiers.   Location: Patient: home Provider: home office   I discussed the limitations of evaluation and management by telemedicine and the availability of in person appointments. The patient expressed understanding and agreed to proceed.   History of Present Illness: Pt is referred to therapy for PTSD (MST) and MDD by the [facility]. Her biggest stressors are her health, taking care of her children (15 & 6) transitioning out of her 12 year relationship. Pt has fibromyalgia and other health issues, which affects her ability to take care of all her family's needs.  She has previous therapy.  Treatment Goals Addressed: Pt will recall the traumatic event without becoming too overwhelmed with emotions 50% of the time, evidenced by self report. Meet with clinician in person weekly for therapy to monitor for progress towards goals and address any barriers to success; Reduce depression from average severity level of 6/10 down to a 4/10 in next 90 days by engaging in 1-2 positive coping skills daily as part of developing self-care routine; Reduce average anxiety level from 7/10 down to 5/10 in next 90 days by utilizing 1-2 relaxation skills/grounding skills per day, such as mindful breathing, progressive muscle relaxation, positive visualizations.  Progression towards Goal: Progressing     Observations/Objective:  Patient engaged in discussion on identified positives to assess how mood has been impacted. Clinician allowed space for group to further process emotions and clinician provided supportive statements throughout session for each patient. It was suggested to patient to continue to identify positives that  impact her mood.        Collaboration of Care: Other:  [facility]    Patient/Guardian was advised Release of Information must be obtained prior to any record release in order to collaborate their care with an outside provider.   Patient/Guardian was advised if they have not already done so to contact the registration department to sign all necessary forms in order for [place] to release information regarding their care.   Consent: Patient/Guardian gives verbal consent for treatment and assignment of benefits for services provided during this visit. Patient/Guardian expressed understanding and agreed to proceed.    Assessment and Plan: Counselor will continue to meet with patient to address treatment plan goals. Patient will continue to follow recommendations of providers and implement skills learned in session and practice coping skills between  Sessions.Diagnosis: depression and ptsd     Follow Up Instructions: I discussed the assessment and treatment plan with the patient. The patient was provided an opportunity to ask questions and all were answered. The patient agreed with the plan and demonstrated an understanding of the plan.   The patient was advised to call back or seek an in-person evaluation if the symptoms worsen or if the condition fails to improve as anticipated.   I provided 90 minutes of non-face-to-face time during the group encounter.     Susan Guerra S, LCAS

## 2023-09-22 ENCOUNTER — Encounter (HOSPITAL_COMMUNITY): Payer: Self-pay | Admitting: Licensed Clinical Social Worker

## 2023-09-22 ENCOUNTER — Ambulatory Visit (INDEPENDENT_AMBULATORY_CARE_PROVIDER_SITE_OTHER): Payer: Medicaid Other | Admitting: Licensed Clinical Social Worker

## 2023-09-22 DIAGNOSIS — F4310 Post-traumatic stress disorder, unspecified: Secondary | ICD-10-CM

## 2023-09-22 DIAGNOSIS — F329 Major depressive disorder, single episode, unspecified: Secondary | ICD-10-CM | POA: Diagnosis not present

## 2023-09-22 DIAGNOSIS — F331 Major depressive disorder, recurrent, moderate: Secondary | ICD-10-CM

## 2023-09-22 NOTE — Progress Notes (Signed)
 Virtual Visit via video Note   I connected with Susan Guerra on 09/22/23 at 10-11am by video enabled telemedicine application and verified that I am speaking with the correct person using two identifiers.   Location: Patient: home Provider: home office   I discussed the limitations of evaluation and management by telemedicine and the availability of in person appointments. The patient expressed understanding and agreed to proceed.   History of Present Illness: Pt is referred to therapy for PTSD (MST) and MDD by the [facility]. Her biggest stressors are her health, taking care of her children (15 & 6) transitioning out of her 12 year relationship. Pt has fibromyalgia and other health issues, which affects her ability to take care of all her family's needs.  She has previous therapy.  Treatment Goals Addressed: Pt will recall the traumatic event without becoming too overwhelmed with emotions 50% of the time, evidenced by self report. Meet with clinician in person weekly for therapy to monitor for progress towards goals and address any barriers to success; Reduce depression from average severity level of 6/10 down to a 4/10 in next 90 days by engaging in 1-2 positive coping skills daily as part of developing self-care routine; Reduce average anxiety level from 7/10 down to 5/10 in next 90 days by utilizing 1-2 relaxation skills/grounding skills per day, such as mindful breathing, progressive muscle relaxation, positive visualizations.  Progression towards Goal: Progressing      Observations/Objective: Patient presented for today's session on time and was alert, oriented x5, with no evidence or self-report of SI/HI or A/V H. Patient reported ongoing compliance with medication and denied any use of alcohol or illicit substances.  Clinician inquired about patient's current emotional ratings, as well as any significant changes in thoughts, feelings or behavior since previous session. Patient's  emotional ratings: 7/10 for depression, 5/10 for anxiety, 3/10 for anger/irritability, recalling traumatic event without becoming too overwhelmed with emotions 50% of the time. All ratings are evidenced by self report. Cln explored trauma emotional ratings with pt who identified her trauma triggers and coping skills used this past week. 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 We discussed the impact these stressors have had on pt as well as how she coped with them over the past week. Cln and pt reviewed her word for the year: Saying no, and how that is affecting her life. Clinician utilized CBT to reflect and summarize thoughts and feelings. Clinician explored triggers and coping skills in order to assist pt in feeling better. Clinician processed triggers and their effects on her mood and behaviors.        Collaboration of Care: Other: Continue with providers at [facility]  Patient/Guardian was advised Release of Information must be obtained prior to any record release in order to collaborate their care with an outside provider.   Patient/Guardian was advised if they have not already done so to contact the registration department to sign all necessary forms in order for us  to release information regarding their care.   Consent: Patient/Guardian gives verbal consent for treatment and assignment of benefits for services provided during this visit. Patient/Guardian expressed understanding and agreed to proceed.  Assessment and Plan: Counselor will continue to meet with patient to address treatment plan goals. Patient will continue to follow recommendations of providers and implement skills learned in session and practice skills between sessions. Diagnosis: MDD and PTSD       Follow Up Instructions: I discussed the assessment and  treatment plan with the patient. The patient was provided an opportunity to ask questions and all were answered. The patient agreed with  the plan and demonstrated an understanding of the plan.   The patient was advised to call back or seek an in-person evaluation if the symptoms worsen or if the condition fails to improve as anticipated.   I provided 60 minutes of non-face-to-face time during this encounter.     Ebony Yorio S, LCAS

## 2023-09-25 ENCOUNTER — Encounter: Payer: Self-pay | Admitting: Emergency Medicine

## 2023-09-25 ENCOUNTER — Ambulatory Visit
Admission: EM | Admit: 2023-09-25 | Discharge: 2023-09-25 | Disposition: A | Discharge status: Home / Self Care | Payer: No Typology Code available for payment source | Attending: Emergency Medicine | Admitting: Emergency Medicine

## 2023-09-25 DIAGNOSIS — J111 Influenza due to unidentified influenza virus with other respiratory manifestations: Secondary | ICD-10-CM

## 2023-09-25 LAB — RESP PANEL BY RT-PCR (FLU A&B, COVID) ARPGX2
Influenza A by PCR: NEGATIVE
Influenza B by PCR: NEGATIVE
SARS Coronavirus 2 by RT PCR: NEGATIVE

## 2023-09-25 MED ORDER — ONDANSETRON HCL 4 MG PO TABS: 4.0000 mg | ORAL_TABLET | Freq: Four times a day (QID) | ORAL | 0 refills | Status: AC

## 2023-09-25 NOTE — ED Provider Notes (Signed)
 [facility]    CSN: [id number] Arrival date & time: 09/25/23  1326      History   Chief Complaint Chief Complaint  Patient presents with   Generalized Body Aches   Headache    HPI Susan Guerra is a 48 y.o. female.   Susan Guerra, 48 year old female presents to urgent care for evaluation of diarrhea, headache, chills that started this morning.  Patient states that she was exposed to COVID.  Patient is pushing fluids, denies vomiting  Past medical history IBS, GAD  The history is provided by the patient. No language interpreter was used.    Past Medical History:  Diagnosis Date   GAD (generalized anxiety disorder)    Tremors of nervous system     Patient Active Problem List   Diagnosis Date Noted   Influenza-like illness 09/25/2023   PTSD (post-traumatic stress disorder) 12/10/2020   MDD (major depressive disorder) 12/10/2020   Atypical migraine 03/04/2020    Past Surgical History:  Procedure Laterality Date   ABDOMINAL HYSTERECTOMY     HIP SURGERY     shoulder  suregery      OB History   No obstetric history on file.      Home Medications    Prior to Admission medications   Medication Sig Start Date End Date Taking? Authorizing Provider  AJOVY 225 MG/1.5ML SOSY Inject 1.5 mLs into the skin every 30 (thirty) days.  01/10/20   [provider]  albuterol (PROVENTIL HFA) 108 (90 Base) MCG/ACT inhaler every 4 (four) hours as needed. 05/18/12   [provider]  Armodafinil 50 MG tablet Take  1 tablet in QAM for  2 days; if tolerated , take  2 tablets in AM. Patient not taking: No sig reported 08/04/17   [provider]  ASMANEX, 60 METERED DOSES, 220 MCG/INH inhaler Inhale 2 puffs into the lungs at bedtime.  11/01/18   [provider]  cetirizine (ZYRTEC) 10 MG tablet Take 10 mg by mouth at bedtime as needed.  09/28/18   [provider]  Cholecalciferol (VITAMIN D3 PO) Take 2 tablets by mouth daily.     [provider]  clonazePAM (KLONOPIN) 2 MG tablet       cyanocobalamin (VITAMIN B12) 1000 MCG tablet Take by mouth. 12/05/22   [provider]  dicyclomine  (BENTYL ) 20 MG tablet Take 1 tablet (20 mg total) by mouth 2 (two) times daily. 03/12/23   Teresa Shelba SAUNDERS, NP  divalproex (DEPAKOTE ER) 500 MG 24 hr tablet  12/31/18   [provider]  DULoxetine  (CYMBALTA ) 30 MG capsule Take 90 mg by mouth daily.  06/21/18   [provider]  ferrous sulfate  325 (65 FE) MG EC tablet Take 325 mg by mouth daily.    [provider]  ipratropium (ATROVENT ) 0.06 % nasal spray Place 2 sprays into both nostrils 4 (four) times daily. 05/06/23   Arvis Jolan NOVAK, PA-C  levonorgestrel (MIRENA) 20 MCG/24HR IUD by Intrauterine route.    [provider]  loperamide  (IMODIUM ) 2 MG capsule Take 1 capsule (2 mg total) by mouth 4 (four) times daily as needed for diarrhea or loose stools. 03/12/23   White, Shelba SAUNDERS, NP  modafinil (PROVIGIL) 200 MG tablet Take 200 mg by mouth daily.  09/28/18   [provider]  Multiple Vitamin (MULTIVITAMIN WITH MINERALS) TABS tablet Take 1 tablet by mouth daily.    [provider]  naproxen  (NAPROSYN ) 500 MG tablet Take 1 tablet (500  mg total) by mouth 2 (two) times daily with a meal. 12/01/22   Brimage, Caprice, DO  ondansetron  (ZOFRAN -ODT) 8 MG disintegrating tablet Take 1 tablet (8 mg total) by mouth every 8 (eight) hours as needed for nausea or vomiting. 03/12/23   Teresa Shelba SAUNDERS, NP  polyethylene glycol powder (MIRALAX ) 17 GM/SCOOP powder 1 scoop (17 grams) daily as needed for constipation. Patient not taking: Reported on 03/04/2020 06/30/19   Gray, Bryan E, NP  pregabalin (LYRICA) 100 MG capsule  09/28/18   [provider]  promethazine -dextromethorphan (PROMETHAZINE -DM) 6.25-15 MG/5ML syrup Take 5 mLs by mouth 4 (four) times daily as needed. 05/06/23   Arvis Jolan NOVAK, PA-C  senna-docusate (SENOKOT-S) 8.6-50 MG  tablet Take 2 tablets by mouth 2 (two) times daily. 01/03/22   Viviann Pastor, MD  SUMAtriptan (IMITREX) 100 MG tablet Take 100 mg by mouth every 2 (two) hours as needed.  10/11/15   [provider]  tiZANidine  (ZANAFLEX ) 2 MG tablet Take 1 tablet (2 mg total) by mouth every 6 (six) hours as needed for muscle spasms. 12/01/22   Brimage, Vondra, DO  topiramate (TOPAMAX) 25 MG tablet TAKE THREE TABLETS BY MOUTH ONCE EVERY DAY FOR TREMOR 07/22/22 07/23/23  [provider]  traMADol  (ULTRAM ) 50 MG tablet Take 1 tablet (50 mg total) by mouth every 6 (six) hours as needed. 01/03/22   Viviann Pastor, MD  traZODone (DESYREL) 100 MG tablet Take 100 mg by mouth at bedtime.  11/01/18   [provider]  zolmitriptan (ZOMIG) 5 MG tablet Take 5 mg by mouth every 2 (two) hours as needed.  12/31/18   [provider]  zonisamide (ZONEGRAN) 100 MG capsule Take by mouth. Patient not taking: Reported on 03/04/2020 07/17/15   [provider]    Family History Family History  Problem Relation Age of Onset   Diabetes Mother    Heart failure Mother    Hypertension Mother    Cancer Father    Heart failure Father    Alzheimer's disease Father     Social History Social History   Tobacco Use   Smoking status: Never   Smokeless tobacco: Never  Vaping Use   Vaping status: Never Used  Substance Use Topics   Alcohol use: Not Currently   Drug use: Never     Allergies   Aspirin , Latex, Buspirone, Acetaminophen , Darunavir, Pseudoephedrine-acetaminophen , Sulfa antibiotics, and Zolpidem   Review of Systems Review of Systems  Constitutional:  Positive for chills.  Musculoskeletal:  Positive for myalgias.  Neurological:  Positive for headaches.  All other systems reviewed and are negative.    Physical Exam Triage Vital Signs ED Triage Vitals  Encounter Vitals Group     BP 09/25/23 1442 129/77     Systolic BP Percentile --      Diastolic BP Percentile --       Pulse Rate 09/25/23 1442 80     Resp 09/25/23 1442 14     Temp 09/25/23 1442 98.3 F (36.8 C)     Temp Source 09/25/23 1442 Oral     SpO2 09/25/23 1442 100 %     Weight 09/25/23 1440 125 lb (56.7 kg)     Height 09/25/23 1440 5' 5.5 (1.664 m)     Head Circumference --      Peak Flow --      Pain Score 09/25/23 1440 3     Pain Loc --      Pain Education --  Exclude from Growth Chart --    No data found.  Updated Vital Signs BP 129/77 (BP Location: Left Arm)   Pulse 80   Temp 98.3 F (36.8 C) (Oral)   Resp 14   Ht 5' 5.5 (1.664 m)   Wt 125 lb (56.7 kg)   LMP 06/20/2019 Comment: signed preg waiver  SpO2 100%   BMI 20.48 kg/m   Visual Acuity Right Eye Distance:   Left Eye Distance:   Bilateral Distance:    Right Eye Near:   Left Eye Near:    Bilateral Near:     Physical Exam Vitals and nursing note reviewed.  Constitutional:      General: She is not in acute distress.    Appearance: She is well-developed.  HENT:     Head: Normocephalic.     Right Ear: Tympanic membrane is retracted.     Left Ear: Tympanic membrane is retracted.     Nose: Congestion present.     Mouth/Throat:     Lips: Pink.     Mouth: Mucous membranes are moist.     Pharynx: Oropharynx is clear.  Eyes:     General: Lids are normal.     Conjunctiva/sclera: Conjunctivae normal.     Pupils: Pupils are equal, round, and reactive to light.  Neck:     Trachea: No tracheal deviation.  Cardiovascular:     Rate and Rhythm: Regular rhythm.     Pulses: Normal pulses.     Heart sounds: Normal heart sounds. No murmur heard. Pulmonary:     Effort: Pulmonary effort is normal.     Breath sounds: Normal breath sounds.  Abdominal:     General: Bowel sounds are increased.     Palpations: Abdomen is soft.     Tenderness: There is no abdominal tenderness.  Musculoskeletal:        General: Normal range of motion.     Cervical back: Normal range of motion.  Lymphadenopathy:     Cervical: No  cervical adenopathy.  Skin:    General: Skin is warm and dry.     Findings: No rash.  Neurological:     General: No focal deficit present.     Mental Status: She is alert and oriented to person, place, and time.     GCS: GCS eye subscore is 4. GCS verbal subscore is 5. GCS motor subscore is 6.  Psychiatric:        Speech: Speech normal.        Behavior: Behavior normal. Behavior is cooperative.      UC Treatments / Results  Labs (all labs ordered are listed, but only abnormal results are displayed) Labs Reviewed  RESP PANEL BY RT-PCR (FLU A&B, COVID) [id number]    EKG   Radiology No results found.  Procedures Procedures (including critical care time)  Medications Ordered in UC Medications - No data to display  Initial Impression / Assessment and Plan / UC Course  I have reviewed the triage vital signs and the nursing notes.  Pertinent labs & imaging results that were available during my care of the patient were reviewed by me and considered in my medical decision making (see chart for details).    Discussed exam findings and plan of care with patient, strict go to ER precautions given.   Patient verbalized understanding to this provider.  Ddx: Viral illness, IBS,allergies Final Clinical Impressions(s) / UC Diagnoses   Final diagnoses:  Influenza-like illness     Discharge Instructions  Your COVID and flu test are both negative Most likely you have a viral illness: no antibiotic is indicated at this time, May treat with OTC meds of choice. Make sure to drink plenty of fluids to stay hydrated(gatorade, water, popsicles,jello,etc), avoid caffeine products. Follow up with PCP. Return as needed.If you have worsening symptoms(chest pain,shortness of breath, etc) go to ER for further evaluation.      ED Prescriptions   None    PDMP not reviewed this encounter.   Aminta Loose, NP 09/25/23 1545

## 2023-09-25 NOTE — Discharge Instructions (Addendum)
 Your COVID and flu test are both negative Most likely you have a viral illness: no antibiotic is indicated at this time, May treat with OTC meds of choice. Make sure to drink plenty of fluids to stay hydrated(gatorade, water, popsicles,jello,etc), avoid caffeine products. Follow up with PCP. Return as needed.If you have worsening symptoms(chest pain,shortness of breath, etc) go to ER for further evaluation.

## 2023-09-25 NOTE — ED Triage Notes (Signed)
 Patient c/o diarrhea, headache, and chills that started this morning.  Patient states that she was exposed to COVID.  Patient unsure of fevers.

## 2023-09-28 ENCOUNTER — Encounter (HOSPITAL_COMMUNITY): Payer: Self-pay | Admitting: Licensed Clinical Social Worker

## 2023-09-28 ENCOUNTER — Ambulatory Visit (HOSPITAL_COMMUNITY): Payer: No Typology Code available for payment source | Admitting: Licensed Clinical Social Worker

## 2023-09-28 DIAGNOSIS — F331 Major depressive disorder, recurrent, moderate: Secondary | ICD-10-CM

## 2023-09-28 DIAGNOSIS — F4310 Post-traumatic stress disorder, unspecified: Secondary | ICD-10-CM | POA: Diagnosis not present

## 2023-09-28 NOTE — Progress Notes (Signed)
 Virtual GroupVisit via video Note   I connected with Susan Guerra on 09/28/23 at 5:00-6:30 pm during group session by video enabled telemedicine application and verified that I am speaking with the correct person using two identifiers.   Location: Patient: home Provider: home office   I discussed the limitations of evaluation and management by telemedicine and the availability of in person appointments. The patient expressed understanding and agreed to proceed.   History of Present Illness: Pt is referred to therapy for PTSD (MST) and MDD by the [facility]. Her biggest stressors are her health, taking care of her children (15 & 6) transitioning out of her 12 year relationship. Pt has fibromyalgia and other health issues, which affects her ability to take care of all her family's needs.  She has previous therapy.  Treatment Goals Addressed: Pt will recall the traumatic event without becoming too overwhelmed with emotions 50% of the time, evidenced by self report. Meet with clinician in person weekly for therapy to monitor for progress towards goals and address any barriers to success; Reduce depression from average severity level of 6/10 down to a 4/10 in next 90 days by engaging in 1-2 positive coping skills daily as part of developing self-care routine; Reduce average anxiety level from 7/10 down to 5/10 in next 90 days by utilizing 1-2 relaxation skills/grounding skills per day, such as mindful breathing, progressive muscle relaxation, positive visualizations.  Progression towards Goal: Progressing     Observations/Objective:  Patient participated in a discussion on fulfillment vs happiness. "Happiness comes from within ourselves. The group defined: Happiness comes from what we do; Fulfillment comes from why we do it. Pt was encouraged to seek fulfillment which is more sustainable than happiness.        Collaboration of Care: Other: [facility]    Patient/Guardian was advised Release of Information must be obtained prior to any record release in order to collaborate their care with an outside provider.   Patient/Guardian was advised if they have not already done so to contact the registration department to sign all necessary forms in order for us  to release information regarding their care.   Consent: Patient/Guardian gives verbal consent for treatment and assignment of benefits for services provided during this visit. Patient/Guardian expressed understanding and agreed to proceed.    Assessment and Plan: Counselor will continue to meet with patient to address treatment plan goals. Patient will continue to follow recommendations of providers and implement skills learned in session and practice coping skills between  Sessions.Diagnosis: depression and ptsd     Follow Up Instructions: I discussed the assessment and treatment plan with the patient. The patient was provided an opportunity to ask questions and all were answered. The patient agreed with the plan and demonstrated an understanding of the plan.   The patient was advised to call back or seek an in-person evaluation if the symptoms worsen or if the condition fails to improve as anticipated.   I provided 90 minutes of non-face-to-face time during the group encounter.     Joanell Cressler S, LCAS

## 2023-09-29 ENCOUNTER — Encounter (HOSPITAL_COMMUNITY): Payer: Self-pay | Admitting: Licensed Clinical Social Worker

## 2023-09-29 ENCOUNTER — Ambulatory Visit (INDEPENDENT_AMBULATORY_CARE_PROVIDER_SITE_OTHER): Payer: No Typology Code available for payment source | Admitting: Licensed Clinical Social Worker

## 2023-09-29 DIAGNOSIS — F4310 Post-traumatic stress disorder, unspecified: Secondary | ICD-10-CM

## 2023-09-29 DIAGNOSIS — F331 Major depressive disorder, recurrent, moderate: Secondary | ICD-10-CM | POA: Diagnosis not present

## 2023-09-29 NOTE — Progress Notes (Signed)
Virtual Visit via video Note   I connected with Susan Guerra on 09/29/23 at 10-11am by video enabled telemedicine application and verified that I am speaking with the correct person using two identifiers.   Location: Patient: home Provider: home office   I discussed the limitations of evaluation and management by telemedicine and the availability of in person appointments. The patient expressed understanding and agreed to proceed.   History of Present Illness: Pt is referred to therapy for PTSD (MST) and MDD by the [facility]. Her biggest stressors are her health, taking care of her children (15 & 6) transitioning out of her 12 year relationship. Pt has fibromyalgia and other health issues, which affects her ability to take care of all her family's needs.  She has previous therapy.  Treatment Goals Addressed: Pt will recall the traumatic event without becoming too overwhelmed with emotions 50% of the time, evidenced by self report. Meet with clinician in person weekly for therapy to monitor for progress towards goals and address any barriers to success; Reduce depression from average severity level of 6/10 down to a 4/10 in next 90 days by engaging in 1-2 positive coping skills daily as part of developing self-care routine; Reduce average anxiety level from 7/10 down to 5/10 in next 90 days by utilizing 1-2 relaxation skills/grounding skills per day, such as mindful breathing, progressive muscle relaxation, positive visualizations.  Progression towards Goal: Progressing      Observations/Objective: Patient presented for today's session on time and was alert, oriented x5, with no evidence or self-report of SI/HI or A/V H. Patient reported ongoing compliance with medication and denied any use of alcohol or illicit substances.  Clinician inquired about patient's current emotional ratings, as well as any significant changes in thoughts, feelings or behavior since previous session. Patient's  emotional ratings: 7/10 for depression, 5/10 for anxiety, 3/10 for anger/irritability, recalling traumatic event without becoming too overwhelmed with emotions 50% of the time. All ratings are evidenced by self report. Cln explored trauma emotional ratings with pt who identified her trauma triggers and coping skills used this past week. Pt provided updates on health, family, x relationship, new relationship, job, Sport and exercise psychologist. We discussed the impact these stressors have had on pt as well as how she coped with them over the past week. Cln and pt reviewed her word for the year: "Saying no," and how that is affecting her life. Pt described her feelings of being overwhelmed all the time with little or no help. Cln discussed with pt how to compartmentalize, how to ask for help, accepting help.       Collaboration of Care: Other: Continue with providers at [facility]  Patient/Guardian was advised Release of Information must be obtained prior to any record release in order to collaborate their care with an outside provider.   Patient/Guardian was advised if they have not already done so to contact the registration department to sign all necessary forms in order for [place] to release information regarding their care.   Consent: Patient/Guardian gives verbal consent for treatment and assignment of benefits for services provided during this visit. Patient/Guardian expressed understanding and agreed to proceed.  Assessment and Plan: Counselor will continue to meet with patient to address treatment plan goals. Patient will continue to follow recommendations of providers and implement skills learned in session and practice skills between sessions. Diagnosis: MDD and PTSD       Follow Up Instructions: I discussed the assessment and treatment plan with the patient. The patient  was provided an opportunity to ask questions and all were answered. The patient agreed with the plan and demonstrated an understanding of the plan.   The  patient was advised to call back or seek an in-person evaluation if the symptoms worsen or if the condition fails to improve as anticipated.   I provided 60 minutes of non-face-to-face time during this encounter.     Juri Dinning S, LCAS

## 2023-10-05 ENCOUNTER — Ambulatory Visit (INDEPENDENT_AMBULATORY_CARE_PROVIDER_SITE_OTHER): Payer: No Typology Code available for payment source | Admitting: Licensed Clinical Social Worker

## 2023-10-05 ENCOUNTER — Encounter (HOSPITAL_COMMUNITY): Payer: Self-pay | Admitting: Licensed Clinical Social Worker

## 2023-10-05 DIAGNOSIS — F4310 Post-traumatic stress disorder, unspecified: Secondary | ICD-10-CM

## 2023-10-05 DIAGNOSIS — F32A Depression, unspecified: Secondary | ICD-10-CM | POA: Diagnosis not present

## 2023-10-05 DIAGNOSIS — F331 Major depressive disorder, recurrent, moderate: Secondary | ICD-10-CM

## 2023-10-05 NOTE — Progress Notes (Signed)
 Virtual GroupVisit via video Note   I connected with Susan Guerra on 10/05/23 at 5:00-6:30 pm during group session by video enabled telemedicine application and verified that I am speaking with the correct person using two identifiers.   Location: Patient: home Provider: home office   I discussed the limitations of evaluation and management by telemedicine and the availability of in person appointments. The patient expressed understanding and agreed to proceed.   History of Present Illness: Pt is referred to therapy for PTSD (MST) and MDD by the [facility] Her biggest stressors are her health, taking care of her children (15 & 6) transitioning out of her 12 year relationship. Pt has fibromyalgia and other health issues, which affects her ability to take care of all her family's needs.  She has previous therapy.  Treatment Goals Addressed: Pt will recall the traumatic event without becoming too overwhelmed with emotions 50% of the time, evidenced by self report. Meet with clinician in person weekly for therapy to monitor for progress towards goals and address any barriers to success; Reduce depression from average severity level of 6/10 down to a 4/10 in next 90 days by engaging in 1-2 positive coping skills daily as part of developing self-care routine; Reduce average anxiety level from 7/10 down to 5/10 in next 90 days by utilizing 1-2 relaxation skills/grounding skills per day, such as mindful breathing, progressive muscle relaxation, positive visualizations.  Progression towards Goal: Progressing     Observations/Objective:  Patient participated in a discussion on reducing stress to improve mental health, by using EFT (Emotional Freedom Technique). The basic tenets of EFT were taught in the group, while patient participated in the technique. Patient was allowed to ask questions about the principles of the healing technique. Patient practiced during the session and was encouraged to  continue using EFT between session to feel address the root cause of distress to feel more relaxed and in control.      Coll[facility]ation [facility]: [facility]    Patient/Guardian was advised Release of Information must be obtained prior to any record release in order to collaborate their care with an outside provider.   Patient/Guardian was advised if they have not already done so to contact the registration department to sign all necessary forms in order for [place] to release information regarding their care.   Consent: Patient/Guardian gives verbal consent for treatment and assignment of benefits for services provided during this visit. Patient/Guardian expressed understanding and agreed to proceed.    Assessment and Plan: Counselor will continue to meet with patient to address treatment plan goals. Patient will continue to follow recommendations of providers and implement skills learned in session and practice coping skills between  Sessions.Diagnosis: depression and ptsd     Follow Up Instructions: I discussed the assessment and treatment plan with the patient. The patient was provided an opportunity to ask questions and all were answered. The patient agreed with the plan and demonstrated an understanding of the plan.   The patient was advised to call back or seek an in-person evaluation if the symptoms worsen or if the condition fails to improve as anticipated.   I provided 90 minutes of non-face-to-face time during the group encounter.     Efrain Clauson S, LCAS

## 2023-10-06 ENCOUNTER — Ambulatory Visit (HOSPITAL_COMMUNITY): Payer: No Typology Code available for payment source | Admitting: Licensed Clinical Social Worker

## 2023-10-08 ENCOUNTER — Ambulatory Visit (INDEPENDENT_AMBULATORY_CARE_PROVIDER_SITE_OTHER): Payer: No Typology Code available for payment source | Admitting: Licensed Clinical Social Worker

## 2023-10-08 DIAGNOSIS — F4310 Post-traumatic stress disorder, unspecified: Secondary | ICD-10-CM

## 2023-10-08 DIAGNOSIS — F331 Major depressive disorder, recurrent, moderate: Secondary | ICD-10-CM

## 2023-10-12 ENCOUNTER — Encounter (HOSPITAL_COMMUNITY): Payer: Self-pay | Admitting: Licensed Clinical Social Worker

## 2023-10-12 ENCOUNTER — Ambulatory Visit (INDEPENDENT_AMBULATORY_CARE_PROVIDER_SITE_OTHER): Payer: No Typology Code available for payment source | Admitting: Licensed Clinical Social Worker

## 2023-10-12 DIAGNOSIS — F32A Depression, unspecified: Secondary | ICD-10-CM | POA: Diagnosis not present

## 2023-10-12 DIAGNOSIS — F4310 Post-traumatic stress disorder, unspecified: Secondary | ICD-10-CM | POA: Diagnosis not present

## 2023-10-12 DIAGNOSIS — F331 Major depressive disorder, recurrent, moderate: Secondary | ICD-10-CM

## 2023-10-12 NOTE — Progress Notes (Signed)
 Virtual GroupVisit via video Note   I connected with Susan Guerra on 10/12/23 at 5:00-6:30 pm during group session by video enabled telemedicine application and verified that I am speaking with the correct person using two identifiers.   Location: Patient: home Provider: home office   I discussed the limitations of evaluation and management by telemedicine and the availability of in person appointments. The patient expressed understanding and agreed to proceed.   History of Present Illness: Pt is referred to therapy for PTSD (MST) and MDD by the [facility]. Her biggest stressors are her health, taking care of her children (15 & 6) transitioning out of her 12 year relationship. Pt has fibromyalgia and other health issues, which affects her ability to take care of all her family's needs.  She has previous therapy.  Treatment Goals Addressed: Pt will recall the traumatic event without becoming too overwhelmed with emotions 50% of the time, evidenced by self report. Meet with clinician in person weekly for therapy to monitor for progress towards goals and address any barriers to success; Reduce depression from average severity level of 6/10 down to a 4/10 in next 90 days by engaging in 1-2 positive coping skills daily as part of developing self-care routine; Reduce average anxiety level from 7/10 down to 5/10 in next 90 days by utilizing 1-2 relaxation skills/grounding skills per day, such as mindful breathing, progressive muscle relaxation, positive visualizations.  Progression towards Goal: Progressing     Observations/Objective:  Patient participated in a discussion on relationships, quantity vs quality, what's important: listening, communicating with openness and honesty, sharing similar values which creates a sense of belonging and bonding. Patient was encouraged to continue to grow within relationships.     Collaboration of Care: Other: [facility]    Patient/Guardian was advised Release of Information must be obtained prior to any record release in order to collaborate their care with an outside provider.   Patient/Guardian was advised if they have not already done so to contact the registration department to sign all necessary forms in order for [place] to release information regarding their care.   Consent: Patient/Guardian gives verbal consent for treatment and assignment of benefits for services provided during this visit. Patient/Guardian expressed understanding and agreed to proceed.    Assessment and Plan: Counselor will continue to meet with patient to address treatment plan goals. Patient will continue to follow recommendations of providers and implement skills learned in session and practice coping skills between  Sessions.Diagnosis: depression and ptsd     Follow Up Instructions: I discussed the assessment and treatment plan with the patient. The patient was provided an opportunity to ask questions and all were answered. The patient agreed with the plan and demonstrated an understanding of the plan.   The patient was advised to call back or seek an in-person evaluation if the symptoms worsen or if the condition fails to improve as anticipated.   I provided 90 minutes of non-face-to-face time during the group encounter.     Leonette Tischer S, LCAS

## 2023-10-13 ENCOUNTER — Encounter (HOSPITAL_COMMUNITY): Payer: Self-pay | Admitting: Licensed Clinical Social Worker

## 2023-10-13 ENCOUNTER — Ambulatory Visit (HOSPITAL_COMMUNITY): Payer: No Typology Code available for payment source | Admitting: Licensed Clinical Social Worker

## 2023-10-13 NOTE — Progress Notes (Signed)
 Virtual Visit via video Note   I connected with Susan Guerra on 10/08/23 at 10-11am by video enabled telemedicine application and verified that I am speaking with the correct person using two identifiers.   Location: Susan Guerra: home Provider: home office   I discussed the limitations of evaluation and management by telemedicine and the availability of in person appointments. The Susan Guerra expressed understanding and agreed to proceed.   History of Present Illness: Susan Guerra is referred to therapy for PTSD (MST) and MDD by the [facility]. Susan Guerra biggest stressors are Susan Guerra health, taking care of Susan Guerra children (15 & 6) transitioning out of Susan Guerra 12 year relationship. Susan Guerra has fibromyalgia and other health issues, which affects Susan Guerra ability to take care of all Susan Guerra family's needs.  Susan Guerra has previous therapy.  Treatment Goals Addressed: Susan Guerra will recall the traumatic event without becoming too overwhelmed with emotions 50% of the time, evidenced by self report. Meet with clinician in person weekly for therapy to monitor for progress towards goals and address any barriers to success; Reduce depression from average severity level of 6/10 down to a 4/10 in next 6 months days by engaging in 1-2 positive coping skills daily as part of developing self-care routine; Reduce average anxiety level from 7/10 down to 5/10 in next 90 days by utilizing 1-2 relaxation skills/grounding skills per day, such as mindful breathing, progressive muscle relaxation, positive visualizations.  Progression towards Goal: Progressing      Observations/Objective: Susan Guerra presented for today's session on time and was alert, oriented x5, with no evidence or self-report of SI/HI or A/V H. Susan Guerra reported ongoing compliance with medication and denied any use of alcohol or illicit substances.  Clinician inquired about Susan Guerra's current emotional ratings, as well as any significant changes in thoughts, feelings or behavior since previous session.  Susan Guerra's emotional ratings: 8/10 for depression, 6/10 for anxiety, 3/10 for anger/irritability, recalling traumatic event without becoming too overwhelmed with emotions 50% of the time. All ratings are evidenced by self report. Susan Guerra explored trauma emotional ratings with Susan Guerra who identified Susan Guerra trauma triggers and coping skills used this past week. Susan Guerra provided updates on health, family, x relationship, new relationship, job, Sport and exercise psychologist. We discussed the impact these stressors have had on Susan Guerra as well as how Susan Guerra coped with them over the past week. Susan Guerra described Susan Guerra depressive symptoms have increased due to feeling ovewhelmed with little support. Susan Guerra and Susan Guerra discussed alternatives for depressive symptoms: TMS, IOP, PHP. "I feel I need to go inpatient but I'm concerned about what that will do to my children." Susan Guerra asked open ended questions. Susan Guerra reviewed a safety check with Susan Guerra who assured this Susan Guerra Susan Guerra was not having suicidal thoughts. Susan Guerra suggested Susan Guerra reach out to Susan Guerra provider at [facility] to discuss alternatives.        Collaboration of Care: Other: Continue with providers at [facility]  Susan Guerra was advised Release of Information must be obtained prior to any record release in order to collaborate their care with an outside provider.   Susan Guerra was advised if they have not already done so to contact the registration department to sign all necessary forms in order for [place] to release information regarding their care.   Consent: Susan Guerra gives verbal consent for treatment and assignment of benefits for services provided during this visit. Susan Guerra expressed understanding and agreed to proceed.  Assessment and Plan: Susan Guerra will continue to meet with Susan Guerra to address treatment plan goals. Susan Guerra will continue to follow recommendations of providers and implement skills  learned in session and practice skills between sessions. Diagnosis: MDD and PTSD       Follow Up Instructions: I discussed the  assessment and treatment plan with the Susan Guerra. The Susan Guerra was provided an opportunity to ask questions and all were answered. The Susan Guerra agreed with the plan and demonstrated an understanding of the plan.   The Susan Guerra was advised to call back or seek an in-person evaluation if the symptoms worsen or if the condition fails to improve as anticipated.   I provided 60 minutes of non-face-to-face time during this encounter.     Moniqua Engebretsen S, LCAS

## 2023-10-14 ENCOUNTER — Ambulatory Visit (HOSPITAL_COMMUNITY): Payer: No Typology Code available for payment source | Admitting: Licensed Clinical Social Worker

## 2023-10-14 DIAGNOSIS — F4310 Post-traumatic stress disorder, unspecified: Secondary | ICD-10-CM

## 2023-10-14 DIAGNOSIS — F331 Major depressive disorder, recurrent, moderate: Secondary | ICD-10-CM

## 2023-10-16 ENCOUNTER — Encounter (HOSPITAL_COMMUNITY): Payer: Self-pay | Admitting: Licensed Clinical Social Worker

## 2023-10-16 NOTE — Progress Notes (Signed)
 Virtual Visit via video Note   I connected with Susan Guerra on 10/14/23 at 2-3pm by video enabled telemedicine application and verified that I am speaking with the correct person using two identifiers.   Location: Patient: home Provider: home office   I discussed the limitations of evaluation and management by telemedicine and the availability of in person appointments. The patient expressed understanding and agreed to proceed.   History of Present Illness: Pt is referred to therapy for PTSD (MST) and MDD by the [facility]. Her biggest stressors are her health, taking care of her children (15 & 6) transitioning out of her 12 year relationship. Pt has fibromyalgia and other health issues, which affects her ability to take care of all her family's needs.  She has previous therapy.  Treatment Goals Addressed: Pt will recall the traumatic event without becoming too overwhelmed with emotions 50% of the time, evidenced by self report. Meet with clinician in person weekly for therapy to monitor for progress towards goals and address any barriers to success; Reduce depression from average severity level of 6/10 down to a 4/10 in next 6 months days by engaging in 1-2 positive coping skills daily as part of developing self-care routine; Reduce average anxiety level from 7/10 down to 5/10 in next 90 days by utilizing 1-2 relaxation skills/grounding skills per day, such as mindful breathing, progressive muscle relaxation, positive visualizations.  Progression towards Goal: Progressing      Observations/Objective: Patient presented for today's session on time and was alert, oriented x5, with no evidence or self-report of SI/HI or A/V H. Patient reported ongoing compliance with medication and denied any use of alcohol or illicit substances.  Clinician inquired about patient's current emotional ratings, as well as any significant changes in thoughts, feelings or behavior since previous session.  Patient's emotional ratings: 8/10 for depression, 6/10 for anxiety, 3/10 for anger/irritability, recalling traumatic event without becoming too overwhelmed with emotions 50% of the time. All ratings are evidenced by self report. Cln explored trauma emotional ratings with pt who identified her trauma triggers and coping skills used this past week. Pt provided updates on health, family, x relationship, new relationship, job, Sport and exercise psychologist. We discussed the impact these stressors have had on pt as well as how she coped with them over the past week. Pt described her depressive symptoms have increased due to feeling ovewhelmed with little support. Cln and pt discussed alternatives for depressive symptoms: TMS, IOP, PHP. "I'm feeling somewhat better this week, I still haven't talked to my provider at [facility] about alternatives and other tx modalities. Again, cln reviewed a safety plan with pt who assured this cln she was not having suicidal thoughts. Again, cln suggested pt reach out to her provider at [facility] to discuss alternatives.        Collaboration of Care: Other: Continue with providers at [facility]  Patient/Guardian was advised Release of Information must be obtained prior to any record release in order to collaborate their care with an outside provider.   Patient/Guardian was advised if they have not already done so to contact the registration department to sign all necessary forms in order for [place] to release information regarding their care.   Consent: Patient/Guardian gives verbal consent for treatment and assignment of benefits for services provided during this visit. Patient/Guardian expressed understanding and agreed to proceed.  Assessment and Plan: Counselor will continue to meet with patient to address treatment plan goals. Patient will continue to follow recommendations of providers and implement skills  learned in session and practice skills between sessions. Diagnosis: MDD and PTSD       Follow Up  Instructions: I discussed the assessment and treatment plan with the patient. The patient was provided an opportunity to ask questions and all were answered. The patient agreed with the plan and demonstrated an understanding of the plan.   The patient was advised to call back or seek an in-person evaluation if the symptoms worsen or if the condition fails to improve as anticipated.   I provided 60 minutes of non-face-to-face time during this encounter.     Henrique Parekh S, LCAS

## 2023-10-19 ENCOUNTER — Encounter (HOSPITAL_COMMUNITY): Payer: Self-pay | Admitting: Licensed Clinical Social Worker

## 2023-10-19 ENCOUNTER — Ambulatory Visit (INDEPENDENT_AMBULATORY_CARE_PROVIDER_SITE_OTHER): Payer: Medicaid Other | Admitting: Licensed Clinical Social Worker

## 2023-10-19 DIAGNOSIS — F331 Major depressive disorder, recurrent, moderate: Secondary | ICD-10-CM

## 2023-10-19 DIAGNOSIS — F4310 Post-traumatic stress disorder, unspecified: Secondary | ICD-10-CM

## 2023-10-19 NOTE — Progress Notes (Signed)
 Virtual GroupVisit via video Note   I connected with Susan Guerra on 10/19/23 at 5:00-6:30 pm during group session by video enabled telemedicine application and verified that I am speaking with the correct person using two identifiers.   Location: Patient: home Provider: home office   I discussed the limitations of evaluation and management by telemedicine and the availability of in person appointments. The patient expressed understanding and agreed to proceed.   History of Present Illness: Pt is referred to therapy for PTSD (MST) and MDD by the [facility]. Her biggest stressors are her health, taking care of her children (15 & 6) transitioning out of her 12 year relationship. Pt has fibromyalgia and other health issues, which affects her ability to take care of all her family's needs.  She has previous therapy.  Treatment Goals Addressed: Pt will recall the traumatic event without becoming too overwhelmed with emotions 50% of the time, evidenced by self report. Meet with clinician in person weekly for therapy to monitor for progress towards goals and address any barriers to success; Reduce depression from average severity level of 6/10 down to a 4/10 in next 90 days by engaging in 1-2 positive coping skills daily as part of developing self-care routine; Reduce average anxiety level from 7/10 down to 5/10 in next 90 days by utilizing 1-2 relaxation skills/grounding skills per day, such as mindful breathing, progressive muscle relaxation, positive visualizations.  Progression towards Goal: Progressing     Observations/Objective:  Patient participated in a group discussion on feeling emotionally overwhelmed. Patient was encouraged to use coping skills, soothing activities and grounding to help with self-soothing. Patient was encouraged to use the self-soothing activities at home between sessions when feeling overwhelmed emotionally.     Collaboration of Care: Other[facility]    Patient/Guardian was advised Release of Information must be obtained prior to any record release in order to collaborate their care with an outside provider.   Patient/Guardian was advised if they have not already done so to contact the registration department to sign all necessary forms in order for [place] to release information regarding their care.   Consent: Patient/Guardian gives verbal consent for treatment and assignment of benefits for services provided during this visit. Patient/Guardian expressed understanding and agreed to proceed.    Assessment and Plan: Counselor will continue to meet with patient to address treatment plan goals. Patient will continue to follow recommendations of providers and implement skills learned in session and practice coping skills between  Sessions.Diagnosis: depression and ptsd     Follow Up Instructions: I discussed the assessment and treatment plan with the patient. The patient was provided an opportunity to ask questions and all were answered. The patient agreed with the plan and demonstrated an understanding of the plan.   The patient was advised to call back or seek an in-person evaluation if the symptoms worsen or if the condition fails to improve as anticipated.   I provided 90 minutes of non-face-to-face time during the group encounter.     Katanya Schlie S, LCAS

## 2023-10-20 ENCOUNTER — Encounter (HOSPITAL_COMMUNITY): Payer: Self-pay | Admitting: Licensed Clinical Social Worker

## 2023-10-20 ENCOUNTER — Ambulatory Visit (INDEPENDENT_AMBULATORY_CARE_PROVIDER_SITE_OTHER): Payer: No Typology Code available for payment source | Admitting: Licensed Clinical Social Worker

## 2023-10-20 DIAGNOSIS — F331 Major depressive disorder, recurrent, moderate: Secondary | ICD-10-CM | POA: Diagnosis not present

## 2023-10-20 DIAGNOSIS — F4310 Post-traumatic stress disorder, unspecified: Secondary | ICD-10-CM

## 2023-10-20 NOTE — Progress Notes (Signed)
 Virtual Visit via video Note   I connected with Susan Guerra on 10/19/23 at 10-10:45am by video enabled telemedicine application and verified that I am speaking with the correct person using two identifiers.   Location: Patient: home Provider: home office   I discussed the limitations of evaluation and management by telemedicine and the availability of in person appointments. The patient expressed understanding and agreed to proceed.   History of Present Illness: Pt is referred to therapy for PTSD (MST) and MDD by the [facility]. Her biggest stressors are her health, taking care of her children (15 & 6) transitioning out of her 12 year relationship. Pt has fibromyalgia and other health issues, which affects her ability to take care of all her family's needs.  She has previous therapy.  Treatment Goals Addressed: Pt will recall the traumatic event without becoming too overwhelmed with emotions 50% of the time, evidenced by self report. Meet with clinician in person weekly for therapy to monitor for progress towards goals and address any barriers to success; Reduce depression from average severity level of 6/10 down to a 4/10 in next 6 months days by engaging in 1-2 positive coping skills daily as part of developing self-care routine; Reduce average anxiety level from 7/10 down to 5/10 in next 90 days by utilizing 1-2 relaxation skills/grounding skills per day, such as mindful breathing, progressive muscle relaxation, positive visualizations.  Progression towards Goal: Progressing      Observations/Objective: Patient presented for today's session on time and was alert, oriented x5, with no evidence or self-report of SI/HI or A/V H. Patient reported ongoing compliance with medication and denied any use of alcohol or illicit substances.  Clinician inquired about patient's current emotional ratings, as well as any significant changes in thoughts, feelings or behavior since previous session.  Patient's emotional ratings: 8/10 for depression, 6/10 for anxiety, 3/10 for anger/irritability, recalling traumatic event without becoming too overwhelmed with emotions 50% of the time. All ratings are evidenced by self report. Cln explored trauma emotional ratings with pt who identified her trauma triggers and coping skills used this past week. Pt provided updates on health, family, x relationship, new relationship, job, Sport and exercise psychologist. We discussed the impact these stressors have had on pt as well as how she coped with them over the past week. Pt described her depressive symptoms have increased due to feeling ovewhelmed with little support. Again, cln and pt discussed alternatives for depressive symptoms: TMS, IOP, PHP. "I'm feeling better this week, I still haven't talked to my provider at [facility] about alternatives and other tx modalities. Again, cln reviewed a safety plan with pt who assured this cln she was not having suicidal thoughts. Again, cln suggested pt reach out to her provider at [facility] to discuss alternatives. This week is the wedding anniversary of her parents. "Im sad thinking about my parents deaths." Cln asked open ended questions about her stages of grief.        Collaboration of Care: Other: Continue with providers at [facility]  Patient/Guardian was advised Release of Information must be obtained prior to any record release in order to collaborate their care with an outside provider.   Patient/Guardian was advised if they have not already done so to contact the registration department to sign all necessary forms in order for [place] to release information regarding their care.   Consent: Patient/Guardian gives verbal consent for treatment and assignment of benefits for services provided during this visit. Patient/Guardian expressed understanding and agreed to proceed.  Assessment and Plan: Counselor will continue to meet with patient to address treatment plan goals. Patient will continue to follow  recommendations of providers and implement skills learned in session and practice skills between sessions. Diagnosis: MDD and PTSD       Follow Up Instructions: I discussed the assessment and treatment plan with the patient. The patient was provided an opportunity to ask questions and all were answered. The patient agreed with the plan and demonstrated an understanding of the plan.   The patient was advised to call back or seek an in-person evaluation if the symptoms worsen or if the condition fails to improve as anticipated.   I provided 45 minutes of non-face-to-face time during this encounter.     Terrall Bley S, LCAS

## 2023-10-26 ENCOUNTER — Ambulatory Visit (INDEPENDENT_AMBULATORY_CARE_PROVIDER_SITE_OTHER): Payer: Medicaid Other | Admitting: Licensed Clinical Social Worker

## 2023-10-26 ENCOUNTER — Encounter (HOSPITAL_COMMUNITY): Payer: Self-pay | Admitting: Licensed Clinical Social Worker

## 2023-10-26 DIAGNOSIS — F4310 Post-traumatic stress disorder, unspecified: Secondary | ICD-10-CM | POA: Diagnosis not present

## 2023-10-26 DIAGNOSIS — F32A Depression, unspecified: Secondary | ICD-10-CM

## 2023-10-26 DIAGNOSIS — F331 Major depressive disorder, recurrent, moderate: Secondary | ICD-10-CM

## 2023-10-26 NOTE — Progress Notes (Signed)
 Virtual GroupVisit via video Note   I connected with Susan Guerra on 10/26/23 at 5:00-6:30 pm during group session by video enabled telemedicine application and verified that I am speaking with the correct person using two identifiers.   Location: Patient: home Provider: home office   I discussed the limitations of evaluation and management by telemedicine and the availability of in person appointments. The patient expressed understanding and agreed to proceed.   History of Present Illness: Pt is referred to therapy for PTSD (MST) and MDD by the [facility]. Her biggest stressors are her health, taking care of her children (15 & 6) transitioning out of her 12 year relationship. Pt has fibromyalgia and other health issues, which affects her ability to take care of all her family's needs.  She has previous therapy.  Treatment Goals Addressed: Pt will recall the traumatic event without becoming too overwhelmed with emotions 50% of the time, evidenced by self report. Meet with clinician in person weekly for therapy to monitor for progress towards goals and address any barriers to success; Reduce depression from average severity level of 6/10 down to a 4/10 in next 90 days by engaging in 1-2 positive coping skills daily as part of developing self-care routine; Reduce average anxiety level from 7/10 down to 5/10 in next 90 days by utilizing 1-2 relaxation skills/grounding skills per day, such as mindful breathing, progressive muscle relaxation, positive visualizations.  Progression towards Goal: Progressing     Observations/Objective:  Patient participated in a discussion on dealing with chronic pain. Clinician used CBT to assist patient in improving quality of life, activities of daily living, focusing on reducing pain and distress by modifying physical sensation, catastrophic thinking and maladaptive behaviors. Patient was encouraged to use CBT to assist patient in improving quality of  life.        Collaboration of Care: Other: [facility]    Patient/Guardian was advised Release of Information must be obtained prior to any record release in order to collaborate their care with an outside provider.   Patient/Guardian was advised if they have not already done so to contact the registration department to sign all necessary forms in order for [place] to release information regarding their care.   Consent: Patient/Guardian gives verbal consent for treatment and assignment of benefits for services provided during this visit. Patient/Guardian expressed understanding and agreed to proceed.    Assessment and Plan: Counselor will continue to meet with patient to address treatment plan goals. Patient will continue to follow recommendations of providers and implement skills learned in session and practice coping skills between  Sessions.Diagnosis: depression and ptsd     Follow Up Instructions: I discussed the assessment and treatment plan with the patient. The patient was provided an opportunity to ask questions and all were answered. The patient agreed with the plan and demonstrated an understanding of the plan.   The patient was advised to call back or seek an in-person evaluation if the symptoms worsen or if the condition fails to improve as anticipated.   I provided 90 minutes of non-face-to-face time during the group encounter.     Susan Guerra S, LCAS

## 2023-10-27 ENCOUNTER — Ambulatory Visit (HOSPITAL_COMMUNITY): Payer: No Typology Code available for payment source | Admitting: Licensed Clinical Social Worker

## 2023-10-27 DIAGNOSIS — F4310 Post-traumatic stress disorder, unspecified: Secondary | ICD-10-CM

## 2023-10-27 DIAGNOSIS — F331 Major depressive disorder, recurrent, moderate: Secondary | ICD-10-CM

## 2023-10-27 DIAGNOSIS — F329 Major depressive disorder, single episode, unspecified: Secondary | ICD-10-CM

## 2023-11-02 ENCOUNTER — Ambulatory Visit (HOSPITAL_COMMUNITY): Payer: Medicaid Other | Admitting: Licensed Clinical Social Worker

## 2023-11-02 ENCOUNTER — Encounter (HOSPITAL_COMMUNITY): Payer: Self-pay

## 2023-11-02 ENCOUNTER — Encounter (HOSPITAL_COMMUNITY): Payer: Self-pay | Admitting: Licensed Clinical Social Worker

## 2023-11-02 NOTE — Progress Notes (Unsigned)
 Virtual Visit via video Note   I connected with Susan Guerra on 10/19/23 at 10-10:45am by video enabled telemedicine application and verified that I am speaking with the correct person using two identifiers.   Location: Patient: home Provider: home office   I discussed the limitations of evaluation and management by telemedicine and the availability of in person appointments. The patient expressed understanding and agreed to proceed.   History of Present Illness: Pt is referred to therapy for PTSD (MST) and MDD by the [facility]. Her biggest stressors are her health, taking care of her children (15 & 6) transitioning out of her 12 year relationship. Pt has fibromyalgia and other health issues, which affects her ability to take care of all her family's needs.  She has previous therapy.  Treatment Goals Addressed: Pt will recall the traumatic event without becoming too overwhelmed with emotions 50% of the time, evidenced by self report. Meet with clinician in person weekly for therapy to monitor for progress towards goals and address any barriers to success; Reduce depression from average severity level of 6/10 down to a 4/10 in next 6 months days by engaging in 1-2 positive coping skills daily as part of developing self-care routine; Reduce average anxiety level from 7/10 down to 5/10 in next 90 days by utilizing 1-2 relaxation skills/grounding skills per day, such as mindful breathing, progressive muscle relaxation, positive visualizations.  Progression towards Goal: Progressing      Observations/Objective: Patient presented for today's session on time and was alert, oriented x5, with no evidence or self-report of SI/HI or A/V H. Patient reported ongoing compliance with medication and denied any use of alcohol or illicit substances.  Clinician inquired about patient's current emotional ratings, as well as any significant changes in thoughts, feelings or behavior since previous session.  Patient's emotional ratings: 8/10 for depression, 6/10 for anxiety, 3/10 for anger/irritability, recalling traumatic event without becoming too overwhelmed with emotions 50% of the time. All ratings are evidenced by self report. Cln explored trauma emotional ratings with pt who identified her trauma triggers and coping skills used this past week. Pt provided updates on health, family, x relationship, new relationship, job, Sport and exercise psychologist. We discussed the impact these stressors have had on pt as well as how she coped with them over the past week. Pt described her depressive symptoms have increased due to feeling ovewhelmed with little support. Again, cln and pt discussed alternatives for depressive symptoms: TMS, IOP, PHP. "I'm feeling better this week, I still haven't talked to my provider at [facility] about alternatives and other tx modalities. Again, cln reviewed a safety plan with pt who assured this cln she was not having suicidal thoughts. Again, cln suggested pt reach out to her provider at [facility] to discuss alternatives. This week is the wedding anniversary of her parents. "Im sad thinking about my parents deaths." Cln asked open ended questions about her stages of grief.        Collaboration of Care: Other: Continue with providers at [facility]  Patient/Guardian was advised Release of Information must be obtained prior to any record release in order to collaborate their care with an outside provider.   Patient/Guardian was advised if they have not already done so to contact the registration department to sign all necessary forms in order for [place] to release information regarding their care.   Consent: Patient/Guardian gives verbal consent for treatment and assignment of benefits for services provided during this visit. Patient/Guardian expressed understanding and agreed to proceed.  Assessment and Plan: Counselor will continue to meet with patient to address treatment plan goals. Patient will continue to follow  recommendations of providers and implement skills learned in session and practice skills between sessions. Diagnosis: MDD and PTSD       Follow Up Instructions: I discussed the assessment and treatment plan with the patient. The patient was provided an opportunity to ask questions and all were answered. The patient agreed with the plan and demonstrated an understanding of the plan.   The patient was advised to call back or seek an in-person evaluation if the symptoms worsen or if the condition fails to improve as anticipated.   I provided 45 minutes of non-face-to-face time during this encounter.     Ciaran Begay S, LCAS

## 2023-11-03 ENCOUNTER — Ambulatory Visit (INDEPENDENT_AMBULATORY_CARE_PROVIDER_SITE_OTHER): Payer: No Typology Code available for payment source | Admitting: Licensed Clinical Social Worker

## 2023-11-03 DIAGNOSIS — F4310 Post-traumatic stress disorder, unspecified: Secondary | ICD-10-CM | POA: Diagnosis not present

## 2023-11-03 DIAGNOSIS — F331 Major depressive disorder, recurrent, moderate: Secondary | ICD-10-CM

## 2023-11-09 ENCOUNTER — Ambulatory Visit (HOSPITAL_COMMUNITY): Payer: Medicaid Other | Admitting: Licensed Clinical Social Worker

## 2023-11-09 ENCOUNTER — Encounter (HOSPITAL_COMMUNITY): Payer: Self-pay | Admitting: Licensed Clinical Social Worker

## 2023-11-09 NOTE — Progress Notes (Signed)
 Virtual Visit via video Note   I connected with Susan Guerra on 11/03/23 at 10-10:45am by video enabled telemedicine application and verified that I am speaking with the correct person using two identifiers.   Location: Patient: home Provider: home office   I discussed the limitations of evaluation and management by telemedicine and the availability of in person appointments. The patient expressed understanding and agreed to proceed.   History of Present Illness: Pt is referred to therapy for PTSD (MST) and MDD by the [facility]. Her biggest stressors are her health, taking care of her children (15 & 6) transitioning out of her 12 year relationship. Pt has fibromyalgia and other health issues, which affects her ability to take care of all her family's needs.  She has previous therapy.  Treatment Goals Addressed: Pt will recall the traumatic event without becoming too overwhelmed with emotions 50% of the time, evidenced by self report. Meet with clinician in person weekly for therapy to monitor for progress towards goals and address any barriers to success; Reduce depression from average severity level of 6/10 down to a 4/10 in next 6 months days by engaging in 1-2 positive coping skills daily as part of developing self-care routine; Reduce average anxiety level from 7/10 down to 5/10 in next 90 days by utilizing 1-2 relaxation skills/grounding skills per day, such as mindful breathing, progressive muscle relaxation, positive visualizations.  Progression towards Goal: Progressing      Observations/Objective: Patient presented for today's session on time and was alert, oriented x5, with no evidence or self-report of SI/HI or A/V H. Patient reported ongoing compliance with medication and denied any use of alcohol or illicit substances.  Clinician inquired about patient's current emotional ratings, as well as any significant changes in thoughts, feelings or behavior since previous session.  Patient's emotional ratings: 8/10 for depression, 6/10 for anxiety, 6/10 for anger/irritability, recalling traumatic event without becoming too overwhelmed with emotions 50% of the time. All ratings are evidenced by self report. Cln explored trauma emotional ratings with pt who identified her trauma triggers and coping skills used this past week. Pt provided updates on health, family, x relationship, new relationship, job, Sport and exercise psychologist. We discussed the impact these stressors have had on pt as well as how she coped with them over the past week. Pt described her depressive symptoms have increased due to feeling ovewhelmed with little support. Again, cln and pt discussed alternatives for depressive symptoms: TMS, IOP, PHP. "I'm feeling better this week, I have an appt next week wiith the provider at [facility] about alternatives and other tx modalities.Malon Kindle and pt reviewed altenative for an increased level of care to [facility] with [facility]. Again, cln reviewed a safety plan with pt who assured this cln she was not having suicidal thoughts.. Again, cln utilized CBT to process thoughts, feelings, and behaviors, using mindfulness skills to deal with the increase of depressive symptoms.      Collaboration of Care: Other: Continue with providers at [facility]  Patient/Guardian was advised Release of Information must be obtained prior to any record release in order to collaborate their care with an outside provider.   Patient/Guardian was advised if they have not already done so to contact the registration department to sign all necessary forms in order for [place] to release information regarding their care.   Consent: Patient/Guardian gives verbal consent for treatment and assignment of benefits for services provided during this visit. Patient/Guardian expressed understanding and agreed to proceed.  Assessment and  Plan: Counselor will continue to meet with patient to address treatment plan goals. Patient will continue to follow  recommendations of providers and implement skills learned in session and practice skills between sessions. Diagnosis: MDD and PTSD       Follow Up Instructions: I discussed the assessment and treatment plan with the patient. The patient was provided an opportunity to ask questions and all were answered. The patient agreed with the plan and demonstrated an understanding of the plan.   The patient was advised to call back or seek an in-person evaluation if the symptoms worsen or if the condition fails to improve as anticipated.   I provided 45 minutes of non-face-to-face time during this encounter.     Aidan Moten S, LCAS

## 2023-11-10 ENCOUNTER — Ambulatory Visit (HOSPITAL_COMMUNITY): Payer: No Typology Code available for payment source | Admitting: Licensed Clinical Social Worker

## 2023-11-16 ENCOUNTER — Encounter (HOSPITAL_COMMUNITY): Payer: Self-pay | Admitting: Licensed Clinical Social Worker

## 2023-11-16 ENCOUNTER — Ambulatory Visit (INDEPENDENT_AMBULATORY_CARE_PROVIDER_SITE_OTHER): Payer: Medicaid Other | Admitting: Licensed Clinical Social Worker

## 2023-11-16 DIAGNOSIS — F331 Major depressive disorder, recurrent, moderate: Secondary | ICD-10-CM

## 2023-11-16 DIAGNOSIS — F4310 Post-traumatic stress disorder, unspecified: Secondary | ICD-10-CM | POA: Diagnosis not present

## 2023-11-16 DIAGNOSIS — F32A Depression, unspecified: Secondary | ICD-10-CM

## 2023-11-16 NOTE — Progress Notes (Signed)
 Virtual GroupVisit via video Note   I connected with Susan Guerra on 11/16/23 at 5:00-7:00 pm during group session by video enabled telemedicine application and verified that I am speaking with the correct person using two identifiers.   Location: Patient: home Provider: home office   I discussed the limitations of evaluation and management by telemedicine and the availability of in person appointments. The patient expressed understanding and agreed to proceed.   History of Present Illness: Pt is referred to therapy for PTSD (MST) and MDD by the [facility]. Her biggest stressors are her health, taking care of her children (15 & 6) transitioning out of her 12 year relationship. Pt has fibromyalgia and other health issues, which affects her ability to take care of all her family's needs.  She has previous therapy.  Treatment Goals Addressed: Pt will recall the traumatic event without becoming too overwhelmed with emotions 50% of the time, evidenced by self report. Meet with clinician in person weekly for therapy to monitor for progress towards goals and address any barriers to success; Reduce depression from average severity level of 6/10 down to a 4/10 in next 90 days by engaging in 1-2 positive coping skills daily as part of developing self-care routine; Reduce average anxiety level from 7/10 down to 5/10 in next 90 days by utilizing 1-2 relaxation skills/grounding skills per day, such as mindful breathing, progressive muscle relaxation, positive visualizations.  Progression towards Goal: Progressing     Observations/Objective:  Pt participated in a discussion on family dysfunction. Clinician assessed patient's family dynamics and discussed how the dynamics influence mental health symptoms in a negative way. Patient was encouraged to work to improve family function.        Collaboration of Care: Other: [facility]    Patient/Guardian was advised Release of  Information must be obtained prior to any record release in order to collaborate their care with an outside provider.   Patient/Guardian was advised if they have not already done so to contact the registration department to sign all necessary forms in order for [place] to release information regarding their care.   Consent: Patient/Guardian gives verbal consent for treatment and assignment of benefits for services provided during this visit. Patient/Guardian expressed understanding and agreed to proceed.    Assessment and Plan: Counselor will continue to meet with patient to address treatment plan goals. Patient will continue to follow recommendations of providers and implement skills learned in session and practice coping skills between  Sessions.Diagnosis: depression and ptsd     Follow Up Instructions: I discussed the assessment and treatment plan with the patient. The patient was provided an opportunity to ask questions and all were answered. The patient agreed with the plan and demonstrated an understanding of the plan.   The patient was advised to call back or seek an in-person evaluation if the symptoms worsen or if the condition fails to improve as anticipated.   I provided 120 minutes of non-face-to-face time during the group encounter.     Harjas Biggins S, LCAS

## 2023-11-17 ENCOUNTER — Ambulatory Visit (INDEPENDENT_AMBULATORY_CARE_PROVIDER_SITE_OTHER): Admitting: Licensed Clinical Social Worker

## 2023-11-17 ENCOUNTER — Encounter (HOSPITAL_COMMUNITY): Payer: Self-pay | Admitting: Licensed Clinical Social Worker

## 2023-11-17 DIAGNOSIS — F4310 Post-traumatic stress disorder, unspecified: Secondary | ICD-10-CM | POA: Diagnosis not present

## 2023-11-17 DIAGNOSIS — F331 Major depressive disorder, recurrent, moderate: Secondary | ICD-10-CM

## 2023-11-17 NOTE — Progress Notes (Signed)
 Virtual Visit via video Note   I connected with Susan Guerra on 11/17/23 at 2-3pm by video enabled telemedicine application and verified that I am speaking with the correct person using two identifiers.   Location: Patient: home Provider: home office   I discussed the limitations of evaluation and management by telemedicine and the availability of in person appointments. The patient expressed understanding and agreed to proceed.   History of Present Illness: Susan Guerra is referred to therapy for PTSD (MST) and MDD by the [facility]. Susan Guerra biggest stressors are Susan Guerra health, taking care of Susan Guerra children (15 & 6) transitioning out of Susan Guerra 12 year relationship. Susan Guerra has fibromyalgia and other health issues, which affects Susan Guerra ability to take care of all Susan Guerra family's needs.  She has previous therapy.  Treatment Goals Addressed: Susan Guerra will recall the traumatic event without becoming too overwhelmed with emotions 50% of the time, evidenced by self report. Meet with clinician in person weekly for therapy to monitor for progress towards goals and address any barriers to success; Reduce depression from average severity level of 6/10 down to a 4/10 in next 6 months days by engaging in 1-2 positive coping skills daily as part of developing self-care routine; Reduce average anxiety level from 7/10 down to 5/10 in next 90 days by utilizing 1-2 relaxation skills/grounding skills per day, such as mindful breathing, progressive muscle relaxation, positive visualizations.  Progression towards Goal: Progressing      Observations/Objective: Patient presented for today's session on time and was alert, oriented x5, with no evidence or self-report of SI/HI or A/V H. Patient reported ongoing compliance with medication and denied any use of alcohol or illicit substances.  Clinician inquired about patient's current emotional ratings, as well as any significant changes in thoughts, feelings or behavior since previous session. Patient's  emotional ratings: 8/10 for depression, 6/10 for anxiety, 6/10 for anger/irritability, recalling traumatic event without becoming too overwhelmed with emotions 50% of the time. All ratings are evidenced by self report. Cln explored trauma emotional ratings with Susan Guerra who identified Susan Guerra trauma triggers and coping skills used this past week. Susan Guerra provided updates on health, family, x relationship, new relationship, job, care taking for x family member,SSDI. We discussed the impact these stressors have had on Susan Guerra as well as how she coped with them over the past week. Susan Guerra described Susan Guerra depressive symptoms have increased due to feeling ovewhelmed with little support. Susan Gu[facility] and Susan Guerra reviewed altenatives for an increased level of care to discuss with [facility] Guerra [facility] provider, and discussed PHP at [facility]. Susan Guerra provider is checking with [facility] for a referral. Cln and Susan Guerra discussed the benefits of PHP and next steps. Also discussed the benefits of TMS, but that may be too stress provoking for Susan Guerra at this point.      Collaboration of Care: Other: Continue with providers at [facility]  Patient/Guardian was advised Release of Information must be obtained prior to any record release in order to collaborate their care with an outside provider.   Patient/Guardian was advised if they have not already done so to contact the registration department to sign all necessary forms in order for [place] to release information regarding their care.   Consent: Patient/Guardian gives verbal consent for treatment and assignment of benefits for services provided during this visit. Patient/Guardian expressed understanding and agreed to proceed.   Assessment and Plan: Counselor will continue to meet with patient to address treatment plan goals. Patient will continue to follow  recommendations of providers and implement skills learned in session and practice skills between sessions. Diagnosis: MDD and PTSD       Follow Up Instructions: I  discussed the assessment and treatment plan with the patient. The patient was provided an opportunity to ask questions and all were answered. The patient agreed with the plan and demonstrated an understanding of the plan.   The patient was advised to call back or seek an in-person evaluation if the symptoms worsen or if the condition fails to improve as anticipated.   I provided 60 minutes of non-face-to-face time during this encounter.     Isadore Bokhari S, LCAS

## 2023-11-23 ENCOUNTER — Ambulatory Visit (HOSPITAL_COMMUNITY): Payer: Medicaid Other | Admitting: Licensed Clinical Social Worker

## 2023-11-23 ENCOUNTER — Encounter (HOSPITAL_COMMUNITY): Payer: Self-pay

## 2023-11-24 ENCOUNTER — Ambulatory Visit (INDEPENDENT_AMBULATORY_CARE_PROVIDER_SITE_OTHER): Admitting: Licensed Clinical Social Worker

## 2023-11-24 ENCOUNTER — Encounter (HOSPITAL_COMMUNITY): Payer: Self-pay | Admitting: Licensed Clinical Social Worker

## 2023-11-24 DIAGNOSIS — F4310 Post-traumatic stress disorder, unspecified: Secondary | ICD-10-CM | POA: Diagnosis not present

## 2023-11-24 DIAGNOSIS — F331 Major depressive disorder, recurrent, moderate: Secondary | ICD-10-CM

## 2023-11-24 NOTE — Progress Notes (Signed)
 Virtual Visit via video Note   I connected with Susan Guerra on 11/24/23 at 10-10:45am by video enabled telemedicine application and verified that I am speaking with the correct person using two identifiers.   Location: Patient: home Provider: home office   I discussed the limitations of evaluation and management by telemedicine and the availability of in person appointments. The patient expressed understanding and agreed to proceed.   History of Present Illness: Pt is referred to therapy for PTSD (MST) and MDD by the [facility]. Her biggest stressors are her health, taking care of her children (15 & 6) transitioning out of her 12 year relationship. Pt has fibromyalgia and other health issues, which affects her ability to take care of all her family's needs.  She has previous therapy.  Treatment Goals Addressed: Pt will recall the traumatic event without becoming too overwhelmed with emotions 50% of the time, evidenced by self report. Meet with clinician in person weekly for therapy to monitor for progress towards goals and address any barriers to success; Reduce depression from average severity level of 6/10 down to a 4/10 in next 6 months by engaging in 1-2 positive coping skills daily as part of developing self-care routine; Reduce average anxiety level from 7/10 down to 5/10 in next 6 months by utilizing 1-2 relaxation skills/grounding skills per day, such as mindful breathing, progressive muscle relaxation, positive visualizations.  Progression towards Goal: Progressing      Observations/Objective: Patient presented for today's session on time and was alert, oriented x5, with no evidence or self-report of SI/HI or A/V H. Patient reported ongoing compliance with medication and denied any use of alcohol or illicit substances.  Clinician inquired about patient's current emotional ratings, as well as any significant changes in thoughts, feelings or behavior since previous session.  Patient's emotional ratings: 8/10 for depression, 6/10 for anxiety, 6/10 for anger/irritability, recalling traumatic event without becoming too overwhelmed with emotions 50% of the time. All ratings are evidenced by self report. Cln explored trauma emotional ratings with pt who identified her trauma triggers and coping skills used this past week. Pt provided updates on health, family, x relationship, new relationship, job, care taking for x family member,SSDI. We discussed the impact these stressors have had on pt as well as how she coped with them over the past week. Pt described her depressive symptoms which have increased due to feeling ovewhelmed with little support. Pt met with her [facility] provider and pt reviewed altenatives for an increased level of care to discuss with her [facility] provider, and discussed PHP & TMS.  Clinician utilized MI OARS to reflect and summarize thoughts and feelings about this new normal life she is experiencing. Clinician discussed the importance of focusing on the here and now, what she can control.    Collaboration of Care: Other: Continue with providers at [facility]  Patient/Guardian was advised Release of Information must be obtained prior to any record release in order to collaborate their care with an outside provider.   Patient/Guardian was advised if they have not already done so to contact the registration department to sign all necessary forms in order for [place] to release information regarding their care.   Consent: Patient/Guardian gives verbal consent for treatment and assignment of benefits for services provided during this visit. Patient/Guardian expressed understanding and agreed to proceed.   Assessment and Plan: Counselor will continue to meet with patient to address treatment plan goals. Patient will continue to follow recommendations of providers and implement skills  learned in session and practice skills between sessions. Diagnosis: MDD and PTSD       Follow Up  Instructions: I discussed the assessment and treatment plan with the patient. The patient was provided an opportunity to ask questions and all were answered. The patient agreed with the plan and demonstrated an understanding of the plan.   The patient was advised to call back or seek an in-person evaluation if the symptoms worsen or if the condition fails to improve as anticipated.   I provided 60 minutes of non-face-to-face time during this encounter.     Robet Crutchfield S, LCAS

## 2023-11-30 ENCOUNTER — Encounter (HOSPITAL_COMMUNITY): Payer: Self-pay | Admitting: Licensed Clinical Social Worker

## 2023-11-30 ENCOUNTER — Ambulatory Visit (INDEPENDENT_AMBULATORY_CARE_PROVIDER_SITE_OTHER): Payer: Medicaid Other | Admitting: Licensed Clinical Social Worker

## 2023-11-30 DIAGNOSIS — F4310 Post-traumatic stress disorder, unspecified: Secondary | ICD-10-CM | POA: Diagnosis not present

## 2023-11-30 DIAGNOSIS — F331 Major depressive disorder, recurrent, moderate: Secondary | ICD-10-CM

## 2023-11-30 NOTE — Progress Notes (Signed)
 Virtual GroupVisit via video Note   I connected with Susan Guerra on 11/30/23 at 5:00-7:00 pm during group session by video enabled telemedicine application and verified that I am speaking with the correct person using two identifiers.   Location: Patient: home Provider: home office   I discussed the limitations of evaluation and management by telemedicine and the availability of in person appointments. The patient expressed understanding and agreed to proceed.   History of Present Illness: Pt is referred to therapy for PTSD (MST) and MDD by the [facility]. Her biggest stressors are her health, taking care of her children (15 & 6) transitioning out of her 12 year relationship. Pt has fibromyalgia and other health issues, which affects her ability to take care of all her family's needs.  She has previous therapy.  Treatment Goals Addressed: Pt will recall the traumatic event without becoming too overwhelmed with emotions 50% of the time, evidenced by self report. Meet with clinician in person weekly for therapy to monitor for progress towards goals and address any barriers to success; Reduce depression from average severity level of 6/10 down to a 4/10 in next 90 days by engaging in 1-2 positive coping skills daily as part of developing self-care routine; Reduce average anxiety level from 7/10 down to 5/10 in next 90 days by utilizing 1-2 relaxation skills/grounding skills per day, such as mindful breathing, progressive muscle relaxation, positive visualizations.  Progression towards Goal: Progressing     Observations/Objective:  Patient participated in a discussion on the benefit of being vulnerable while sharing in group: Ask for what you need, be willing to expose your feelings, say what you want, express your emotions and be present. Patient was encouraged to show vulnerability while sharing in group.        Collaboration of Care: Other: [facility] to see [facility]  provider[facility]    Patient/Guardian was advised Release of Information must be obtained prior to any record release in order to collaborate their care with an outside provider.   Patient/Guardian was advised if they have not already done so to contact the registration department to sign all necessary forms in order for us  to release information regarding their care.   Consent: Patient/Guardian gives verbal consent for treatment and assignment of benefits for services provided during this visit. Patient/Guardian expressed understanding and agreed to proceed.    Assessment and Plan: Counselor will continue to meet with patient to address treatment plan goals. Patient will continue to follow recommendations of providers and implement skills learned in session and practice coping skills between  Sessions.Diagnosis: depression and ptsd     Follow Up Instructions: I discussed the assessment and treatment plan with the patient. The patient was provided an opportunity to ask questions and all were answered. The patient agreed with the plan and demonstrated an understanding of the plan.   The patient was advised to call back or seek an in-person evaluation if the symptoms worsen or if the condition fails to improve as anticipated.   I provided 120 minutes of non-face-to-face time during the group encounter.     Chadwin Fury S, LCAS

## 2023-12-01 ENCOUNTER — Ambulatory Visit (HOSPITAL_COMMUNITY): Admitting: Licensed Clinical Social Worker

## 2023-12-02 ENCOUNTER — Ambulatory Visit (INDEPENDENT_AMBULATORY_CARE_PROVIDER_SITE_OTHER): Admitting: Licensed Clinical Social Worker

## 2023-12-02 DIAGNOSIS — F331 Major depressive disorder, recurrent, moderate: Secondary | ICD-10-CM | POA: Diagnosis not present

## 2023-12-02 DIAGNOSIS — F4310 Post-traumatic stress disorder, unspecified: Secondary | ICD-10-CM | POA: Diagnosis not present

## 2023-12-07 ENCOUNTER — Encounter (HOSPITAL_COMMUNITY): Payer: Self-pay | Admitting: Licensed Clinical Social Worker

## 2023-12-07 ENCOUNTER — Ambulatory Visit (INDEPENDENT_AMBULATORY_CARE_PROVIDER_SITE_OTHER): Payer: Medicaid Other | Admitting: Licensed Clinical Social Worker

## 2023-12-07 DIAGNOSIS — F4310 Post-traumatic stress disorder, unspecified: Secondary | ICD-10-CM | POA: Diagnosis not present

## 2023-12-07 DIAGNOSIS — F331 Major depressive disorder, recurrent, moderate: Secondary | ICD-10-CM | POA: Diagnosis not present

## 2023-12-07 NOTE — Progress Notes (Signed)
 Virtual GroupVisit via video Note   I connected with Susan Guerra on 12/07/23 at 5:00-7:00 pm during group session by video enabled telemedicine application and verified that I am speaking with the correct person using two identifiers.   Location: Patient: home Provider: home office   I discussed the limitations of evaluation and management by telemedicine and the availability of in person appointments. The patient expressed understanding and agreed to proceed.   History of Present Illness: Pt is referred to therapy for PTSD (MST) and MDD by the [facility]. Her biggest stressors are her health, taking care of her children (15 & 6) transitioning out of her 12 year relationship. Pt has fibromyalgia and other health issues, which affects her ability to take care of all her family's needs.  She has previous therapy.  Treatment Goals Addressed: Pt will recall the traumatic event without becoming too overwhelmed with emotions 50% of the time, evidenced by self report. Meet with clinician in person weekly for therapy to monitor for progress towards goals and address any barriers to success; Reduce depression from average severity level of 6/10 down to a 4/10 in next 90 days by engaging in 1-2 positive coping skills daily as part of developing self-care routine; Reduce average anxiety level from 7/10 down to 5/10 in next 90 days by utilizing 1-2 relaxation skills/grounding skills per day, such as mindful breathing, progressive muscle relaxation, positive visualizations.  Progression towards Goal: Progressing     Observations/Objective:  Patient participated in a group discussion on "tolerating the holidays - Easter" with family members. Patient described his holiday plans and coping skills to deal with feelings surrounding the holidays. Patient was encouraged to express her feelings in positive ways.      Collaboration of Care: Other: [facility] see [facility] provider[facility]    Patient/Guardian was  advised Release of Information must be obtained prior to any record release in order to collaborate their care with an outside provider.   Patient/Guardian was advised if they have not already done so to contact the registration department to sign all necessary forms in order for us  to release information regarding their care.   Consent: Patient/Guardian gives verbal consent for treatment and assignment of benefits for services provided during this visit. Patient/Guardian expressed understanding and agreed to proceed.    Assessment and Plan: Counselor will continue to meet with patient to address treatment plan goals. Patient will continue to follow recommendations of providers and implement skills learned in session and practice coping skills between  Sessions.Diagnosis: depression and ptsd     Follow Up Instructions: I discussed the assessment and treatment plan with the patient. The patient was provided an opportunity to ask questions and all were answered. The patient agreed with the plan and demonstrated an understanding of the plan.   The patient was advised to call back or seek an in-person evaluation if the symptoms worsen or if the condition fails to improve as anticipated.   I provided 120 minutes of non-face-to-face time during the group encounter.     Trumaine Wimer S, LCAS

## 2023-12-07 NOTE — Progress Notes (Unsigned)
 Virtual Visit via video Note   I connected with Susan Guerra on 12/02/23 at 10-11am by video enabled telemedicine application and verified that I am speaking with the correct person using two identifiers.   Location: Patient: home Provider: home office   I discussed the limitations of evaluation and management by telemedicine and the availability of in person appointments. The patient expressed understanding and agreed to proceed.   History of Present Illness: Pt is referred to therapy for PTSD (MST) and MDD by the [facility] Her biggest stressors are her health, taking care of her children (15 & 6) transitioning out of her 12 year relationship. Pt has fibromyalgia and other health issues, which affects her ability to take care of all her family's needs.  She has previous therapy.  Treatment Goals Addressed: Pt will recall the traumatic event without becoming too overwhelmed with emotions 50% of the time, evidenced by self report. Meet with clinician in person weekly for therapy to monitor for progress towards goals and address any barriers to success; Reduce depression from average severity level of 6/10 down to a 4/10 in next 6 months by engaging in 1-2 positive coping skills daily as part of developing self-care routine; Reduce average anxiety level from 7/10 down to 5/10 in next 6 months by utilizing 1-2 relaxation skills/grounding skills per day, such as mindful breathing, progressive muscle relaxation, positive visualizations.  Progression towards Goal: Progressing      Observations/Objective: Patient presented for today's session on time and was alert, oriented x5, with no evidence or self-report of SI/HI or A/V H. Patient reported ongoing compliance with medication and denied any use of alcohol or illicit substances.  Clinician inquired about patient's current emotional ratings, as well as any significant changes in thoughts, feelings or behavior since previous session. Patient's  emotional ratings: 8/10 for depression, 6/10 for anxiety, 6/10 for anger/irritability, recalling traumatic event without becoming too overwhelmed with emotions 50% of the time. All ratings are evidenced by self report. Cln explored trauma emotional ratings with pt who identified her trauma triggers and coping skills used this past week. Pt provided updates on health, family, x relationship, new relationship, job, care taking for x family member,SSDI. We discussed the impact these stressors have had on pt as well as how she coped with them over the past week. Pt described her depressive symptoms which have increased due to feeling ovewhelmed with little support.    Pt met with her [facility] provider and pt reviewed altenatives for an increased level of care to discuss with her [facility] provider, and discussed PHP & TMS.  Clinician utilized MI OARS to reflect and summarize thoughts and feelings about this new normal life she is experiencing. Clinician discussed the importance of focusing on the here and now, what she can control.    Collaboration of Care: Other: Continue with providers at [facility]  Patient/Guardian was advised Release of Information must be obtained prior to any record release in order to collaborate their care with an outside provider.   Patient/Guardian was advised if they have not already done so to contact the registration department to sign all necessary forms in order for us  to release information regarding their care.   Consent: Patient/Guardian gives verbal consent for treatment and assignment of benefits for services provided during this visit. Patient/Guardian expressed understanding and agreed to proceed.   Assessment and Plan: Counselor will continue to meet with patient to address treatment plan goals. Patient will continue to follow recommendations of providers  and implement skills learned in session and practice skills between sessions. Diagnosis: MDD and PTSD       Follow Up  Instructions: I discussed the assessment and treatment plan with the patient. The patient was provided an opportunity to ask questions and all were answered. The patient agreed with the plan and demonstrated an understanding of the plan.   The patient was advised to call back or seek an in-person evaluation if the symptoms worsen or if the condition fails to improve as anticipated.   I provided 60 minutes of non-face-to-face time during this encounter.     Shanterria Franta S, LCAS

## 2023-12-08 ENCOUNTER — Ambulatory Visit (HOSPITAL_COMMUNITY): Admitting: Licensed Clinical Social Worker

## 2023-12-09 ENCOUNTER — Ambulatory Visit (INDEPENDENT_AMBULATORY_CARE_PROVIDER_SITE_OTHER): Admitting: Licensed Clinical Social Worker

## 2023-12-09 DIAGNOSIS — F4310 Post-traumatic stress disorder, unspecified: Secondary | ICD-10-CM | POA: Diagnosis not present

## 2023-12-09 DIAGNOSIS — F331 Major depressive disorder, recurrent, moderate: Secondary | ICD-10-CM

## 2023-12-09 DIAGNOSIS — F329 Major depressive disorder, single episode, unspecified: Secondary | ICD-10-CM

## 2023-12-14 ENCOUNTER — Ambulatory Visit (INDEPENDENT_AMBULATORY_CARE_PROVIDER_SITE_OTHER): Payer: Medicaid Other | Admitting: Licensed Clinical Social Worker

## 2023-12-14 ENCOUNTER — Encounter (HOSPITAL_COMMUNITY): Payer: Self-pay | Admitting: Licensed Clinical Social Worker

## 2023-12-14 DIAGNOSIS — F331 Major depressive disorder, recurrent, moderate: Secondary | ICD-10-CM | POA: Diagnosis not present

## 2023-12-14 DIAGNOSIS — F4310 Post-traumatic stress disorder, unspecified: Secondary | ICD-10-CM

## 2023-12-14 NOTE — Progress Notes (Signed)
 Virtual GroupVisit via video Note   I connected with Susan Guerra on 12/14/23 at 5:00-7:00 pm during group session by video enabled telemedicine application and verified that I am speaking with the correct person using two identifiers.   Location: Patient: home Provider: home office   I discussed the limitations of evaluation and management by telemedicine and the availability of in person appointments. The patient expressed understanding and agreed to proceed.   History of Present Illness: Pt is referred to therapy for PTSD (MST) and MDD by the [facility]. Her biggest stressors are her health, taking care of her children (15 & 6) transitioning out of her 12 year relationship. Pt has fibromyalgia and other health issues, which affects her ability to take care of all her family's needs.  She has previous therapy.  Treatment Goals Addressed: Pt will recall the traumatic event without becoming too overwhelmed with emotions 50% of the time, evidenced by self report. Meet with clinician in person weekly for therapy to monitor for progress towards goals and address any barriers to success; Reduce depression from average severity level of 6/10 down to a 4/10 in next 90 days by engaging in 1-2 positive coping skills daily as part of developing self-care routine; Reduce average anxiety level from 7/10 down to 5/10 in next 90 days by utilizing 1-2 relaxation skills/grounding skills per day, such as mindful breathing, progressive muscle relaxation, positive visualizations.  Progression towards Goal: Progressing     Observations/Objective:  Patient participated in a discussion on dealing with health issues, including chronic pain. Cln used CBT to assist patient in improving the quality of life, activities of daily living, focusing on reducing stress by modifying physical sensation, catastrophic thinking and maladaptive behaviors. Patient was encouraged to improve quality of life.      Collaboration  of Care: Other: [facility] to see [facility] provider[facility]    Patient/Guardian was advised Release of Information must be obtained prior to any record release in order to collaborate their care with an outside provider.   Patient/Guardian was advised if they have not already done so to contact the registration department to sign all necessary forms in order for us  to release information regarding their care.   Consent: Patient/Guardian gives verbal consent for treatment and assignment of benefits for services provided during this visit. Patient/Guardian expressed understanding and agreed to proceed.    Assessment and Plan: Counselor will continue to meet with patient to address treatment plan goals. Patient will continue to follow recommendations of providers and implement skills learned in session and practice coping skills between  Sessions.Diagnosis: depression and ptsd     Follow Up Instructions: I discussed the assessment and treatment plan with the patient. The patient was provided an opportunity to ask questions and all were answered. The patient agreed with the plan and demonstrated an understanding of the plan.   The patient was advised to call back or seek an in-person evaluation if the symptoms worsen or if the condition fails to improve as anticipated.   I provided 120 minutes of non-face-to-face time during the group encounter.     Waldon Sheerin S, LCAS

## 2023-12-14 NOTE — Progress Notes (Signed)
 Virtual Visit via video Note   I connected with Susan Guerra on 12/09/23 at 10-11am by video enabled telemedicine application and verified that I am speaking with the correct person using two identifiers.   Location: Patient: home Provider: home office   I discussed the limitations of evaluation and management by telemedicine and the availability of in person appointments. The patient expressed understanding and agreed to proceed.   History of Present Illness: Pt is referred to therapy for PTSD (MST) and MDD by the [facility]. Her biggest stressors are her health, taking care of her children (15 & 6) transitioning out of her 12 year relationship. Pt has fibromyalgia and other health issues, which affects her ability to take care of all her family's needs.  She has previous therapy.  Treatment Goals Addressed: Pt will recall the traumatic event without becoming too overwhelmed with emotions 50% of the time, evidenced by self report. Meet with clinician in person weekly for therapy to monitor for progress towards goals and address any barriers to success; Reduce depression from average severity level of 6/10 down to a 4/10 in next 6 months by engaging in 1-2 positive coping skills daily as part of developing self-care routine; Reduce average anxiety level from 7/10 down to 5/10 in next 6 months by utilizing 1-2 relaxation skills/grounding skills per day, such as mindful breathing, progressive muscle relaxation, positive visualizations.  Progression towards Goal: Progressing      Observations/Objective: Patient presented for today's session on time and was alert, oriented x5, with no evidence or self-report of SI/HI or A/V H. Patient reported ongoing compliance with medication and denied any use of alcohol or illicit substances.  Clinician inquired about patient's current emotional ratings, as well as any significant changes in thoughts, feelings or behavior since previous session. Patient's  emotional ratings: 8/10 for depression, 6/10 for anxiety, 6/10 for anger/irritability, recalling traumatic event without becoming too overwhelmed with emotions 50% of the time. All ratings are evidenced by self report. Cln explored trauma emotional ratings with pt who identified her trauma triggers and coping skills used this past week. Pt provided updates on health, family, x relationship, job, care taking for x family member,SSDI. We discussed the impact these stressors have had on pt as well as how she coped with them over the past week. Pt described her depressive symptoms which have increased due to feeling ovewhelmed with little support. ). Majority of skill practice was focused on  identifying specific problem (and priority/goal) and brainstorming  options/alternatives.       Collaboration of Care: Other: Continue with providers at [facility]  Patient/Guardian was advised Release of Information must be obtained prior to any record release in order to collaborate their care with an outside provider.   Patient/Guardian was advised if they have not already done so to contact the registration department to sign all necessary forms in order for us  to release information regarding their care.   Consent: Patient/Guardian gives verbal consent for treatment and assignment of benefits for services provided during this visit. Patient/Guardian expressed understanding and agreed to proceed.   Assessment and Plan: Counselor will continue to meet with patient to address treatment plan goals. Patient will continue to follow recommendations of providers and implement skills learned in session and practice skills between sessions. Diagnosis: MDD and PTSD       Follow Up Instructions: I discussed the assessment and treatment plan with the patient. The patient was provided an opportunity to ask questions and all were  answered. The patient agreed with the plan and demonstrated an understanding of the plan.   The  patient was advised to call back or seek an in-person evaluation if the symptoms worsen or if the condition fails to improve as anticipated.   I provided 60 minutes of non-face-to-face time during this encounter.     Brittay Mogle S, LCAS

## 2023-12-15 ENCOUNTER — Ambulatory Visit (HOSPITAL_COMMUNITY): Admitting: Licensed Clinical Social Worker

## 2023-12-15 DIAGNOSIS — F4310 Post-traumatic stress disorder, unspecified: Secondary | ICD-10-CM | POA: Diagnosis not present

## 2023-12-15 DIAGNOSIS — F331 Major depressive disorder, recurrent, moderate: Secondary | ICD-10-CM | POA: Diagnosis not present

## 2023-12-16 ENCOUNTER — Encounter (HOSPITAL_COMMUNITY): Payer: Self-pay | Admitting: Licensed Clinical Social Worker

## 2023-12-16 NOTE — Progress Notes (Signed)
 Virtual Visit via video Note   I connected with Susan Guerra on 12/15/23 at 10-11am by video enabled telemedicine application and verified that I am speaking with the correct person using two identifiers.   Location: Patient: home Provider: home office   I discussed the limitations of evaluation and management by telemedicine and the availability of in person appointments. The patient expressed understanding and agreed to proceed.   History of Present Illness: Pt is referred to therapy for PTSD (MST) and MDD by the [facility]. Her biggest stressors are her health, taking care of her children (15 & 6) transitioning out of her 12 year relationship. Pt has fibromyalgia and other health issues, which affects her ability to take care of all her family's needs.  She has previous therapy.  Treatment Goals Addressed: Pt will recall the traumatic event without becoming too overwhelmed with emotions 50% of the time, evidenced by self report. Meet with clinician in person weekly for therapy to monitor for progress towards goals and address any barriers to success; Reduce depression from average severity level of 6/10 down to a 4/10 in next 6 months by engaging in 1-2 positive coping skills daily as part of developing self-care routine; Reduce average anxiety level from 7/10 down to 5/10 in next 6 months by utilizing 1-2 relaxation skills/grounding skills per day, such as mindful breathing, progressive muscle relaxation, positive visualizations.  Progression towards Goal: Progressing      Observations/Objective: Patient presented for today's session on time and was alert, oriented x5, with no evidence or self-report of SI/HI or A/V H. Patient reported ongoing compliance with medication and denied any use of alcohol or illicit substances.  Clinician inquired about patient's current emotional ratings, as well as any significant changes in thoughts, feelings or behavior since previous session. Patient's  emotional ratings: 8/10 for depression, 6/10 for anxiety, 6/10 for anger/irritability, recalling traumatic event without becoming too overwhelmed with emotions 50% of the time. All ratings are evidenced by self report. Cln explored trauma emotional ratings with pt who identified her trauma triggers and coping skills used this past week. Pt provided updates on health, family, x relationship, job, care taking for x family member,SSDI. We discussed the impact these stressors have had on pt as well as how she coped with them over the past week. Pt described her depressive symptoms which have increased due to feeling ovewhelmed with little support. "I ended up in the ED last week due to having gastro issues and being dehydrated. I continue to have my 27 yo son full time since his father cannot take care of him and his elderly father as well. Cln asked open ended questions and expressed concerns about pt's health.Clinician utilized CBT to process concerns about health,  worries about being overwhelmed with little health, and work challenges of single parenting of 3 children with little support. Clln provided supportive statements. Aaron Aas       Collaboration of Care: Other: Continue with providers at [facility]  Patient/Guardian was advised Release of Information must be obtained prior to any record release in order to collaborate their care with an outside provider.   Patient/Guardian was advised if they have not already done so to contact the registration department to sign all necessary forms in order for us  to release information regarding their care.   Consent: Patient/Guardian gives verbal consent for treatment and assignment of benefits for services provided during this visit. Patient/Guardian expressed understanding and agreed to proceed.   Assessment and Plan: Counselor  will continue to meet with patient to address treatment plan goals. Patient will continue to follow recommendations of providers and implement  skills learned in session and practice skills between sessions. Diagnosis: MDD and PTSD       Follow Up Instructions: I discussed the assessment and treatment plan with the patient. The patient was provided an opportunity to ask questions and all were answered. The patient agreed with the plan and demonstrated an understanding of the plan.   The patient was advised to call back or seek an in-person evaluation if the symptoms worsen or if the condition fails to improve as anticipated.   I provided 60 minutes of non-face-to-face time during this encounter.     Winfred Redel S, LCAS

## 2023-12-21 ENCOUNTER — Ambulatory Visit (HOSPITAL_COMMUNITY): Payer: Medicaid Other | Admitting: Licensed Clinical Social Worker

## 2023-12-21 ENCOUNTER — Encounter (HOSPITAL_COMMUNITY): Payer: Self-pay

## 2023-12-22 ENCOUNTER — Ambulatory Visit (HOSPITAL_COMMUNITY): Admitting: Licensed Clinical Social Worker

## 2023-12-22 ENCOUNTER — Encounter (HOSPITAL_COMMUNITY): Payer: Self-pay | Admitting: Licensed Clinical Social Worker

## 2023-12-22 DIAGNOSIS — F331 Major depressive disorder, recurrent, moderate: Secondary | ICD-10-CM | POA: Diagnosis not present

## 2023-12-22 DIAGNOSIS — F4310 Post-traumatic stress disorder, unspecified: Secondary | ICD-10-CM | POA: Diagnosis not present

## 2023-12-22 NOTE — Progress Notes (Signed)
 Virtual Visit via video Note   I connected with Susan Guerra on 12/22/23 at 10-11am by video enabled telemedicine application and verified that I am speaking with the correct person using two identifiers.   Location: Patient: home Provider: home office   I discussed the limitations of evaluation and management by telemedicine and the availability of in person appointments. The patient expressed understanding and agreed to proceed.   History of Present Illness: Pt is referred to therapy for PTSD (MST) and MDD by the [facility]. Her biggest stressors are her health, taking care of her children (15 & 6) transitioning out of her 12 year relationship. Pt has fibromyalgia and other health issues, which affects her ability to take care of all her family's needs.  She has previous therapy.  Treatment Goals Addressed: Pt will recall the traumatic event without becoming too overwhelmed with emotions 50% of the time, evidenced by self report. Meet with clinician in person weekly for therapy to monitor for progress towards goals and address any barriers to success; Reduce depression from average severity level of 6/10 down to a 4/10 in next 6 months by engaging in 1-2 positive coping skills daily as part of developing self-care routine; Reduce average anxiety level from 7/10 down to 5/10 in next 6 months by utilizing 1-2 relaxation skills/grounding skills per day, such as mindful breathing, progressive muscle relaxation, positive visualizations.  Progression towards Goal: Progressing      Observations/Objective: Patient presented for today's session on time and was alert, oriented x5, with no evidence or self-report of SI/HI or A/V H. Patient reported ongoing compliance with medication and denied any use of alcohol or illicit substances.  Clinician inquired about patient's current emotional ratings, as well as any significant changes in thoughts, feelings or behavior since previous session. Patient's  emotional ratings: 8/10 for depression, 6/10 for anxiety, 4/10 for anger/irritability, recalling traumatic event without becoming too overwhelmed with emotions 50% of the time. All ratings are evidenced by self report. Cln explored trauma emotional ratings with pt who identified her trauma triggers and coping skills used this past week. Pt provided updates on health, family, x relationship, job, care taking for x family member,SSDI. We discussed the impact these stressors have had on pt as well as how she coped with them over the past week. Pt described her depressive symptoms which have increased due to feeling ovewhelmed with little support.. I continue to have my 46 yo son full time since my x his father cannot take care of him and his elderly father as well. Cln asked open ended questions and expressed concerns about pt's health.Clinician utilized CBT to process concerns about health,  worries about being overwhelmed with little help, her physical and mental health, and work challenges of single parenting of 3 children with little support. Clln provided supportive statements.and encouraging pt to plan a schedule to take care of herself as well as her 3 children. Pt reports, "I'm moving around the first week of June." Cln asked open ended questions. Aaron Aas       Collaboration of Care: Other: Continue with providers at [facility]  Patient/Guardian was advised Release of Information must be obtained prior to any record release in order to collaborate their care with an outside provider.   Patient/Guardian was advised if they have not already done so to contact the registration department to sign all necessary forms in order for us  to release information regarding their care.   Consent: Patient/Guardian gives verbal consent for treatment  and assignment of benefits for services provided during this visit. Patient/Guardian expressed understanding and agreed to proceed.   Assessment and Plan: Counselor will continue  to meet with patient to address treatment plan goals. Patient will continue to follow recommendations of providers and implement skills learned in session and practice skills between sessions. Diagnosis: MDD and PTSD       Follow Up Instructions: I discussed the assessment and treatment plan with the patient. The patient was provided an opportunity to ask questions and all were answered. The patient agreed with the plan and demonstrated an understanding of the plan.   The patient was advised to call back or seek an in-person evaluation if the symptoms worsen or if the condition fails to improve as anticipated.   I provided 60 minutes of non-face-to-face time during this encounter.     Truett Mcfarlan S, LCAS

## 2023-12-28 ENCOUNTER — Ambulatory Visit (INDEPENDENT_AMBULATORY_CARE_PROVIDER_SITE_OTHER): Payer: Medicaid Other | Admitting: Licensed Clinical Social Worker

## 2023-12-28 ENCOUNTER — Encounter (HOSPITAL_COMMUNITY): Payer: Self-pay | Admitting: Licensed Clinical Social Worker

## 2023-12-28 DIAGNOSIS — F4310 Post-traumatic stress disorder, unspecified: Secondary | ICD-10-CM | POA: Diagnosis not present

## 2023-12-28 DIAGNOSIS — F32A Depression, unspecified: Secondary | ICD-10-CM | POA: Diagnosis not present

## 2023-12-28 DIAGNOSIS — F331 Major depressive disorder, recurrent, moderate: Secondary | ICD-10-CM

## 2023-12-28 NOTE — Progress Notes (Signed)
 Virtual GroupVisit via video Note   I connected with Susan Guerra on 12/28/23 at 5:00-7:00 pm during group session by video enabled telemedicine application and verified that I am speaking with the correct person using two identifiers.   Location: Patient: home Provider: home office   I discussed the limitations of evaluation and management by telemedicine and the availability of in person appointments. The patient expressed understanding and agreed to proceed.   History of Present Illness: Pt is referred to therapy for PTSD (MST) and MDD by the [facility]. Her biggest stressors are her health, taking care of her children (15 & 6) transitioning out of her 12 year relationship. Pt has fibromyalgia and other health issues, which affects her ability to take care of all her family's needs.  She has previous therapy.  Treatment Goals Addressed: Pt will recall the traumatic event without becoming too overwhelmed with emotions 50% of the time, evidenced by self report. Meet with clinician in person weekly for therapy to monitor for progress towards goals and address any barriers to success; Reduce depression from average severity level of 6/10 down to a 4/10 in next 90 days by engaging in 1-2 positive coping skills daily as part of developing self-care routine; Reduce average anxiety level from 7/10 down to 5/10 in next 90 days by utilizing 1-2 relaxation skills/grounding skills per day, such as mindful breathing, progressive muscle relaxation, positive visualizations.  Progression towards Goal: Progressing     Observations/Objective:  Pt participated in a discussion on Radical acceptance, which is defined as accepting something fully, both mentally and emotionally. It does not require liking or approving of something, just accepting the facts of the situation. Pt was encouraged to use Radical Acceptance to improve pt's mental health.     Collaboration of Care: Other: [facility]    Patient/Guardian was advised Release of Information must be obtained prior to any record release in order to collaborate their care with an outside provider.   Patient/Guardian was advised if they have not already done so to contact the registration department to sign all necessary forms in order for us  to release information regarding their care.   Consent: Patient/Guardian gives verbal consent for treatment and assignment of benefits for services provided during this visit. Patient/Guardian expressed understanding and agreed to proceed.    Assessment and Plan: Counselor will continue to meet with patient to address treatment plan goals. Patient will continue to follow recommendations of providers and implement skills learned in session and practice coping skills between  Sessions.Diagnosis: depression and ptsd     Follow Up Instructions: I discussed the assessment and treatment plan with the patient. The patient was provided an opportunity to ask questions and all were answered. The patient agreed with the plan and demonstrated an understanding of the plan.   The patient was advised to call back or seek an in-person evaluation if the symptoms worsen or if the condition fails to improve as anticipated.   I provided 120 minutes of non-face-to-face time during the group encounter.     Sharnese Heath S, LCAS

## 2023-12-29 ENCOUNTER — Ambulatory Visit (HOSPITAL_COMMUNITY): Admitting: Licensed Clinical Social Worker

## 2023-12-29 DIAGNOSIS — F4310 Post-traumatic stress disorder, unspecified: Secondary | ICD-10-CM

## 2023-12-29 DIAGNOSIS — F329 Major depressive disorder, single episode, unspecified: Secondary | ICD-10-CM

## 2023-12-29 DIAGNOSIS — F331 Major depressive disorder, recurrent, moderate: Secondary | ICD-10-CM

## 2024-01-04 ENCOUNTER — Encounter (HOSPITAL_COMMUNITY): Payer: Self-pay | Admitting: Licensed Clinical Social Worker

## 2024-01-04 ENCOUNTER — Ambulatory Visit (HOSPITAL_COMMUNITY): Payer: Medicaid Other | Admitting: Licensed Clinical Social Worker

## 2024-01-04 DIAGNOSIS — F32A Depression, unspecified: Secondary | ICD-10-CM | POA: Diagnosis not present

## 2024-01-04 DIAGNOSIS — F331 Major depressive disorder, recurrent, moderate: Secondary | ICD-10-CM

## 2024-01-04 DIAGNOSIS — F4310 Post-traumatic stress disorder, unspecified: Secondary | ICD-10-CM | POA: Diagnosis not present

## 2024-01-04 NOTE — Progress Notes (Signed)
 Virtual GroupVisit via video Note   I connected with Susan Guerra on 01/04/24 at 5:00-7:00 pm during group session by video enabled telemedicine application and verified that I am speaking with the correct person using two identifiers.   Location: Patient: home Provider: home office   I discussed the limitations of evaluation and management by telemedicine and the availability of in person appointments. The patient expressed understanding and agreed to proceed.   History of Present Illness: Pt is referred to therapy for PTSD (MST) and MDD by the [facility]. Her biggest stressors are her health, taking care of her children (15 & 6) transitioning out of her 12 year relationship. Pt has fibromyalgia and other health issues, which affects her ability to take care of all her family's needs.  She has previous therapy.  Treatment Goals Addressed: Pt will recall the traumatic event without becoming too overwhelmed with emotions 50% of the time, evidenced by self report. Meet with clinician in person weekly for therapy to monitor for progress towards goals and address any barriers to success; Reduce depression from average severity level of 6/10 down to a 4/10 in next 90 days by engaging in 1-2 positive coping skills daily as part of developing self-care routine; Reduce average anxiety level from 7/10 down to 5/10 in next 90 days by utilizing 1-2 relaxation skills/grounding skills per day, such as mindful breathing, progressive muscle relaxation, positive visualizations.  Progression towards Goal: Progressing     Observations/Objective:  Patient participated in a discussion on Memorial Day celebrations: honoring and mourning fallen soldiers, putting flags on fellow veteran's graves, attending [employer], rendering respect of fellow soldiers. Clinician honored each veteran in this group for their respective service in the [employer].    Collaboration of Care: Other: [facility]    Patient/Guardian was advised Release of Information must be obtained prior to any record release in order to collaborate their care with an outside provider.   Patient/Guardian was advised if they have not already done so to contact the registration department to sign all necessary forms in order for us  to release information regarding their care.   Consent: Patient/Guardian gives verbal consent for treatment and assignment of benefits for services provided during this visit. Patient/Guardian expressed understanding and agreed to proceed.    Assessment and Plan: Counselor will continue to meet with patient to address treatment plan goals. Patient will continue to follow recommendations of providers and implement skills learned in session and practice coping skills between  Sessions.Diagnosis: depression and ptsd     Follow Up Instructions: I discussed the assessment and treatment plan with the patient. The patient was provided an opportunity to ask questions and all were answered. The patient agreed with the plan and demonstrated an understanding of the plan.   The patient was advised to call back or seek an in-person evaluation if the symptoms worsen or if the condition fails to improve as anticipated.   I provided 120 minutes of non-face-to-face time during the group encounter.     Suprina Mandeville S, LCAS

## 2024-01-04 NOTE — Progress Notes (Signed)
 Virtual Visit via video Note   I connected with Susan Guerra on 12/29/23 at 10-11am by video enabled telemedicine application and verified that I am speaking with the correct person using two identifiers.   Location: Patient: home Provider: home office   I discussed the limitations of evaluation and management by telemedicine and the availability of in person appointments. The patient expressed understanding and agreed to proceed.   History of Present Illness: Pt is referred to therapy for PTSD (MST) and MDD by the [facility]. Her biggest stressors are her health, taking care of her children (15 & 6) transitioning out of her 12 year relationship. Pt has fibromyalgia and other health issues, which affects her ability to take care of all her family's needs.  She has previous therapy.  Treatment Goals Addressed: Pt will recall the traumatic event without becoming too overwhelmed with emotions 50% of the time, evidenced by self report. Meet with clinician in person weekly for therapy to monitor for progress towards goals and address any barriers to success; Reduce depression from average severity level of 6/10 down to a 4/10 in next 6 months by engaging in 1-2 positive coping skills daily as part of developing self-care routine; Reduce average anxiety level from 7/10 down to 5/10 in next 6 months by utilizing 1-2 relaxation skills/grounding skills per day, such as mindful breathing, progressive muscle relaxation, positive visualizations.  Progression towards Goal: Progressing      Observations/Objective: Patient presented for today's session on time and was alert, oriented x5, with no evidence or self-report of SI/HI or A/V H. Patient reported ongoing compliance with medication and denied any use of alcohol or illicit substances.  Clinician inquired about patient's current emotional ratings, as well as any significant changes in thoughts, feelings or behavior since previous session. Patient's  emotional ratings: 8/10 for depression, 6/10 for anxiety, 4/10 for anger/irritability, recalling traumatic event without becoming too overwhelmed with emotions 50% of the time. All ratings are evidenced by self report. Cln explored trauma emotional ratings with pt who identified her trauma triggers and coping skills used this past week. Pt provided updates on health, family, x relationship, job, care taking for x family member,SSDI. We discussed the impact these stressors have had on pt as well as how she coped with them over the past week. Pt described her depressive symptoms which have increased due to feeling ovewhelmed with little support.Elden Greenhouse utilized CBT to process concerns about health,  worries about being overwhelmed with little help, her physical and mental health, and work challenges of single parenting of 3 children with little support. Again, clln provided supportive statements.and encouraging pt to plan a schedule to take care of herself as well as her 3 children.      Collaboration of Care: Other: Continue with providers at [facility]  Patient/Guardian was advised Release of Information must be obtained prior to any record release in order to collaborate their care with an outside provider.   Patient/Guardian was advised if they have not already done so to contact the registration department to sign all necessary forms in order for us  to release information regarding their care.   Consent: Patient/Guardian gives verbal consent for treatment and assignment of benefits for services provided during this visit. Patient/Guardian expressed understanding and agreed to proceed.   Assessment and Plan: Counselor will continue to meet with patient to address treatment plan goals. Patient will continue to follow recommendations of providers and implement skills learned in session and practice skills between sessions.  Diagnosis: MDD and PTSD       Follow Up Instructions: I discussed the assessment and  treatment plan with the patient. The patient was provided an opportunity to ask questions and all were answered. The patient agreed with the plan and demonstrated an understanding of the plan.   The patient was advised to call back or seek an in-person evaluation if the symptoms worsen or if the condition fails to improve as anticipated.   I provided 60 minutes of non-face-to-face time during this encounter.     Lauramae Kneisley S, LCAS

## 2024-01-05 ENCOUNTER — Ambulatory Visit (HOSPITAL_COMMUNITY): Admitting: Licensed Clinical Social Worker

## 2024-01-05 DIAGNOSIS — F4310 Post-traumatic stress disorder, unspecified: Secondary | ICD-10-CM | POA: Diagnosis not present

## 2024-01-05 DIAGNOSIS — F329 Major depressive disorder, single episode, unspecified: Secondary | ICD-10-CM

## 2024-01-05 DIAGNOSIS — F331 Major depressive disorder, recurrent, moderate: Secondary | ICD-10-CM

## 2024-01-11 ENCOUNTER — Encounter (HOSPITAL_COMMUNITY): Payer: Self-pay | Admitting: Licensed Clinical Social Worker

## 2024-01-11 NOTE — Progress Notes (Signed)
 Virtual Visit via video Note   I connected with Susan Guerra on 01/05/24 at 10-11am by video enabled telemedicine application and verified that I am speaking with the correct person using two identifiers.   Location: Patient: home Provider: home office   I discussed the limitations of evaluation and management by telemedicine and the availability of in person appointments. The patient expressed understanding and agreed to proceed.   History of Present Illness: Pt is referred to therapy for PTSD (MST) and MDD by the [facility]. Her biggest stressors are her health, taking care of her children (15 & 6) transitioning out of her 12 year relationship. Pt has fibromyalgia and other health issues, which affects her ability to take care of all her family's needs.  She has previous therapy.  Treatment Goals Addressed: Pt will recall the traumatic event without becoming too overwhelmed with emotions 50% of the time, evidenced by self report. Meet with clinician in person weekly for therapy to monitor for progress towards goals and address any barriers to success; Reduce depression from average severity level of 6/10 down to a 4/10 in next 6 months by engaging in 1-2 positive coping skills daily as part of developing self-care routine; Reduce average anxiety level from 7/10 down to 5/10 in next 6 months by utilizing 1-2 relaxation skills/grounding skills per day, such as mindful breathing, progressive muscle relaxation, positive visualizations.  Progression towards Goal: Progressing      Observations/Objective: Patient presented for today's session on time and was alert, oriented x5, with no evidence or self-report of SI/HI or A/V H. Patient reported ongoing compliance with medication and denied any use of alcohol or illicit substances.  Clinician inquired about patient's current emotional ratings, as well as any significant changes in thoughts, feelings or behavior since previous session. Patient's  emotional ratings: 8/10 for depression, 6/10 for anxiety, 4/10 for anger/irritability, recalling traumatic event without becoming too overwhelmed with emotions 50% of the time. All ratings are evidenced by self report. Cln explored trauma emotional ratings with pt who identified her trauma triggers and coping skills used this past week. Pt provided updates on health, family, x relationship, job, care taking for x family member, moving, packing. We discussed the impact these stressors have had on pt as well as how she coped with them over the past week. Pt described her depressive symptoms which have increased due to feeling ovewhelmed with little support. Clinician utilized [facility] OARS to reflect and summarize thoughts and feelings about this new normal life she is experiencing    Collaboration of Care: Other: Continue with providers at [facility]  Patient/Guardian was advised Release of Information must be obtained prior to any record release in order to collaborate their care with an outside provider.   Patient/Guardian was advised if they have not already done so to contact the registration department to sign all necessary forms in order for us  to release information regarding their care.   Consent: Patient/Guardian gives verbal consent for treatment and assignment of benefits for services provided during this visit. Patient/Guardian expressed understanding and agreed to proceed.   Assessment and Plan: Counselor will continue to meet with patient to address treatment plan goals. Patient will continue to follow recommendations of providers and implement skills learned in session and practice skills between sessions. Diagnosis: MDD and PTSD       Follow Up Instructions: I discussed the assessment and treatment plan with the patient. The patient was provided an opportunity to ask questions and all were  answered. The patient agreed with the plan and demonstrated an understanding of the plan.   The patient was  advised to call back or seek an in-person evaluation if the symptoms worsen or if the condition fails to improve as anticipated.   I provided 60 minutes of non-face-to-face time during this encounter.     Deeric Cruise S, LCAS

## 2024-01-12 ENCOUNTER — Encounter (HOSPITAL_COMMUNITY): Payer: Self-pay | Admitting: Licensed Clinical Social Worker

## 2024-01-12 ENCOUNTER — Ambulatory Visit (HOSPITAL_COMMUNITY): Admitting: Licensed Clinical Social Worker

## 2024-01-12 DIAGNOSIS — F4310 Post-traumatic stress disorder, unspecified: Secondary | ICD-10-CM | POA: Diagnosis not present

## 2024-01-12 DIAGNOSIS — F331 Major depressive disorder, recurrent, moderate: Secondary | ICD-10-CM

## 2024-01-12 DIAGNOSIS — F329 Major depressive disorder, single episode, unspecified: Secondary | ICD-10-CM | POA: Diagnosis not present

## 2024-01-12 NOTE — Progress Notes (Signed)
 Virtual Visit via video Note   I connected with Susan Guerra on 01/12/24 at 10-10:45am by video enabled telemedicine application and verified that I am speaking with the correct person using two identifiers.   Location: Patient: home Provider: home office   I discussed the limitations of evaluation and management by telemedicine and the availability of in person appointments. The patient expressed understanding and agreed to proceed.   History of Present Illness: Pt is referred to therapy for PTSD (MST) and MDD by the [facility]. Her biggest stressors are her health, taking care of her children (15 & 6) transitioning out of her 12 year relationship. Pt has fibromyalgia and other health issues, which affects her ability to take care of all her family's needs.  She has previous therapy.  Treatment Goals Addressed: Pt will recall the traumatic event without becoming too overwhelmed with emotions 50% of the time, evidenced by self report. Meet with clinician in person weekly for therapy to monitor for progress towards goals and address any barriers to success; Reduce depression from average severity level of 6/10 down to a 4/10 in next 6 months by engaging in 1-2 positive coping skills daily as part of developing self-care routine; Reduce average anxiety level from 7/10 down to 5/10 in next 6 months by utilizing 1-2 relaxation skills/grounding skills per day, such as mindful breathing, progressive muscle relaxation, positive visualizations.  Progression towards Goal: Progressing      Observations/Objective: Patient presented for today's session on time and was alert, oriented x5, with no evidence or self-report of SI/HI or A/V H. Patient reported ongoing compliance with medication and denied any use of alcohol or illicit substances.  Clinician inquired about patient's current emotional ratings, as well as any significant changes in thoughts, feelings or behavior since previous session.  Patient's emotional ratings: 8/10 for depression, 7/10 for anxiety, 4/10 for anger/irritability, recalling traumatic event without becoming too overwhelmed with emotions 50% of the time. All ratings are evidenced by self report. Cln explored trauma emotional ratings with pt who identified her trauma triggers and coping skills used this past week. Pt provided updates on health, family, x relationship, job, care taking for x family member, moving, packing. We discussed the impact these stressors have had on pt as well as how she coped with them over the past week. Pt shared her memorial day holiday, cookout, family reunion, memories of fallen soldiers. Cln and pt discussed moving her appts to every 2 weeks, "Therapy takes so much out of me, its draining." Cln explained the purpose of therapy, but agreed to bi-weekly appts.    Collaboration of Care: Other: Continue with providers at [facility]  Patient/Guardian was advised Release of Information must be obtained prior to any record release in order to collaborate their care with an outside provider.   Patient/Guardian was advised if they have not already done so to contact the registration department to sign all necessary forms in order for us  to release information regarding their care.   Consent: Patient/Guardian gives verbal consent for treatment and assignment of benefits for services provided during this visit. Patient/Guardian expressed understanding and agreed to proceed.   Assessment and Plan: Counselor will continue to meet with patient to address treatment plan goals. Patient will continue to follow recommendations of providers and implement skills learned in session and practice skills between sessions. Diagnosis: MDD and PTSD       Follow Up Instructions: I discussed the assessment and treatment plan with the patient. The patient  was provided an opportunity to ask questions and all were answered. The patient agreed with the plan and demonstrated an  understanding of the plan.   The patient was advised to call back or seek an in-person evaluation if the symptoms worsen or if the condition fails to improve as anticipated.   I provided 45 minutes of non-face-to-face time during this encounter.     Mehdi Gironda S, LCAS

## 2024-01-18 ENCOUNTER — Ambulatory Visit (HOSPITAL_COMMUNITY): Admitting: Licensed Clinical Social Worker

## 2024-01-25 ENCOUNTER — Encounter (HOSPITAL_COMMUNITY): Payer: Self-pay

## 2024-01-25 ENCOUNTER — Ambulatory Visit (HOSPITAL_COMMUNITY): Admitting: Licensed Clinical Social Worker

## 2024-01-26 ENCOUNTER — Encounter (HOSPITAL_COMMUNITY): Payer: Self-pay | Admitting: Licensed Clinical Social Worker

## 2024-01-26 ENCOUNTER — Ambulatory Visit (INDEPENDENT_AMBULATORY_CARE_PROVIDER_SITE_OTHER): Admitting: Licensed Clinical Social Worker

## 2024-01-26 DIAGNOSIS — F329 Major depressive disorder, single episode, unspecified: Secondary | ICD-10-CM | POA: Diagnosis not present

## 2024-01-26 DIAGNOSIS — F4310 Post-traumatic stress disorder, unspecified: Secondary | ICD-10-CM

## 2024-01-26 DIAGNOSIS — F331 Major depressive disorder, recurrent, moderate: Secondary | ICD-10-CM

## 2024-01-26 NOTE — Progress Notes (Signed)
 Virtual Visit via video Note   I connected with Susan Guerra on 01/26/24 at 10-11:00pm by video enabled telemedicine application and verified that I am speaking with the correct person using two identifiers.   Location: Patient: home Provider: home office   I discussed the limitations of evaluation and management by telemedicine and the availability of in person appointments. The patient expressed understanding and agreed to proceed.   History of Present Illness: Pt is referred to therapy for PTSD (MST) and MDD by the [facility]. Her biggest stressors are her health, taking care of her children (15 & 6) transitioning out of her 12 year relationship. Pt has fibromyalgia and other health issues, which affects her ability to take care of all her family's needs.  She has previous therapy.  Treatment Goals Addressed: Pt will recall the traumatic event without becoming too overwhelmed with emotions 50% of the time, evidenced by self report. Meet with clinician in person weekly for therapy to monitor for progress towards goals and address any barriers to success; Reduce depression from average severity level of 6/10 down to a 4/10 in next 6 months by engaging in 1-2 positive coping skills daily as part of developing self-care routine; Reduce average anxiety level from 7/10 down to 5/10 in next 6 months by utilizing 1-2 relaxation skills/grounding skills per day, such as mindful breathing, progressive muscle relaxation, positive visualizations.  Progression towards Goal: Progressing      Observations/Objective: Patient presented for today's session on time and was alert, oriented x5, with no evidence or self-report of SI/HI or A/V H. Patient reported ongoing compliance with medication and denied any use of alcohol or illicit substances.  Clinician inquired about patient's current emotional ratings, as well as any significant changes in thoughts, feelings or behavior since previous session.  Patient's emotional ratings: 8/10 for depression, 7/10 for anxiety, 4/10 for anger/irritability, recalling traumatic event without becoming too overwhelmed with emotions 50% of the time. All ratings are evidenced by self report. Cln explored trauma emotional ratings with pt who identified her trauma triggers and coping skills used this past week. Pt provided updates on health, family, x relationship, job, care taking for x family member, moving, packing. We discussed the impact these stressors have had on pt as well as how she coped with them over the past week. "I'm exhausted having moved over the weekend.I had no help except my teenage daughter." Cln asked open ended questions about her lack of assistance. "I'm disappointed that the people I help all the time, did not come through to help me. "What are you going to do different?"  Cln utilized CBT to assist pt in decision making and problem solving.   Collaboration of Care: Other: Continue with providers at [facility]  Patient/Guardian was advised Release of Information must be obtained prior to any record release in order to collaborate their care with an outside provider.   Patient/Guardian was advised if they have not already done so to contact the registration department to sign all necessary forms in order for us  to release information regarding their care.   Consent: Patient/Guardian gives verbal consent for treatment and assignment of benefits for services provided during this visit. Patient/Guardian expressed understanding and agreed to proceed.   Assessment and Plan: Counselor will continue to meet with patient to address treatment plan goals. Patient will continue to follow recommendations of providers and implement skills learned in session and practice skills between sessions. Diagnosis: MDD and PTSD  Follow Up Instructions: I discussed the assessment and treatment plan with the patient. The patient was provided an opportunity to ask  questions and all were answered. The patient agreed with the plan and demonstrated an understanding of the plan.   The patient was advised to call back or seek an in-person evaluation if the symptoms worsen or if the condition fails to improve as anticipated.   I provided 60 minutes of non-face-to-face time during this encounter.     Anjelina Dung S, LCAS

## 2024-02-01 ENCOUNTER — Encounter (HOSPITAL_COMMUNITY): Payer: Self-pay | Admitting: Licensed Clinical Social Worker

## 2024-02-01 ENCOUNTER — Ambulatory Visit (INDEPENDENT_AMBULATORY_CARE_PROVIDER_SITE_OTHER): Admitting: Licensed Clinical Social Worker

## 2024-02-01 DIAGNOSIS — F4310 Post-traumatic stress disorder, unspecified: Secondary | ICD-10-CM

## 2024-02-01 DIAGNOSIS — F32A Depression, unspecified: Secondary | ICD-10-CM | POA: Diagnosis not present

## 2024-02-01 DIAGNOSIS — F331 Major depressive disorder, recurrent, moderate: Secondary | ICD-10-CM

## 2024-02-01 NOTE — Progress Notes (Signed)
 Virtual GroupVisit via video Note   I connected with Susan Guerra on 02/01/24 at 5:00-7:00 pm during group session by video enabled telemedicine application and verified that I am speaking with the correct person using two identifiers.   Location: Patient: home Provider: home office   I discussed the limitations of evaluation and management by telemedicine and the availability of in person appointments. The patient expressed understanding and agreed to proceed.   History of Present Illness: Pt is referred to therapy for PTSD (MST) and MDD by the [facility]. Her biggest stressors are her health, taking care of her children (15 & 6) transitioning out of her 12 year relationship. Pt has fibromyalgia and other health issues, which affects her ability to take care of all her family's needs.  She has previous therapy.  Treatment Goals Addressed: Pt will recall the traumatic event without becoming too overwhelmed with emotions 50% of the time, evidenced by self report. Meet with clinician in person weekly for therapy to monitor for progress towards goals and address any barriers to success; Reduce depression from average severity level of 6/10 down to a 4/10 in next 90 days by engaging in 1-2 positive coping skills daily as part of developing self-care routine; Reduce average anxiety level from 7/10 down to 5/10 in next 90 days by utilizing 1-2 relaxation skills/grounding skills per day, such as mindful breathing, progressive muscle relaxation, positive visualizations.  Progression towards Goal: Progressing     Observations/Objective:  Patient participated in a discussion on Father's Day last weekend. It will be bittersweet for some, having lost fathers, not hearing from children, and remembering past Father's Days. Cln encouraged pt to remember the past bittersweet memories.     Collaboration of Care: Other[facility]    Patient/Guardian was advised Release of  Information must be obtained prior to any record release in order to collaborate their care with an outside provider.   Patient/Guardian was advised if they have not already done so to contact the registration department to sign all necessary forms in order for us  to release information regarding their care.   Consent: Patient/Guardian gives verbal consent for treatment and assignment of benefits for services provided during this visit. Patient/Guardian expressed understanding and agreed to proceed.    Assessment and Plan: Counselor will continue to meet with patient to address treatment plan goals. Patient will continue to follow recommendations of providers and implement skills learned in session and practice coping skills between  Sessions.Diagnosis: depression and ptsd     Follow Up Instructions: I discussed the assessment and treatment plan with the patient. The patient was provided an opportunity to ask questions and all were answered. The patient agreed with the plan and demonstrated an understanding of the plan.   The patient was advised to call back or seek an in-person evaluation if the symptoms worsen or if the condition fails to improve as anticipated.   I provided 120 minutes of non-face-to-face time during the group encounter.     Adonai Selsor S, LCAS

## 2024-02-08 ENCOUNTER — Ambulatory Visit (INDEPENDENT_AMBULATORY_CARE_PROVIDER_SITE_OTHER): Admitting: Licensed Clinical Social Worker

## 2024-02-08 ENCOUNTER — Encounter (HOSPITAL_COMMUNITY): Payer: Self-pay | Admitting: Licensed Clinical Social Worker

## 2024-02-08 DIAGNOSIS — F32A Depression, unspecified: Secondary | ICD-10-CM

## 2024-02-08 DIAGNOSIS — F4310 Post-traumatic stress disorder, unspecified: Secondary | ICD-10-CM

## 2024-02-08 DIAGNOSIS — F331 Major depressive disorder, recurrent, moderate: Secondary | ICD-10-CM

## 2024-02-08 NOTE — Progress Notes (Signed)
 Virtual GroupVisit via video Note   I connected with Susan Guerra on 02/08/24 at 5:00-6:00 pm during group session by video enabled telemedicine application and verified that I am speaking with the correct person using two identifiers.   Location: Patient: home Provider: home office   I discussed the limitations of evaluation and management by telemedicine and the availability of in person appointments. The patient expressed understanding and agreed to proceed.   History of Present Illness: Pt is referred to therapy for PTSD (MST) and MDD by the [facility]. Her biggest stressors are her health, taking care of her children (15 & 6) transitioning out of her 12 year relationship. Pt has fibromyalgia and other health issues, which affects her ability to take care of all her family's needs.  She has previous therapy.  Treatment Goals Addressed: Pt will recall the traumatic event without becoming too overwhelmed with emotions 50% of the time, evidenced by self report. Meet with clinician in person weekly for therapy to monitor for progress towards goals and address any barriers to success; Reduce depression from average severity level of 6/10 down to a 4/10 in next 90 days by engaging in 1-2 positive coping skills daily as part of developing self-care routine; Reduce average anxiety level from 7/10 down to 5/10 in next 90 days by utilizing 1-2 relaxation skills/grounding skills per day, such as mindful breathing, progressive muscle relaxation, positive visualizations.  Progression towards Goal: Progressing     Observations/Objective:   Patient participated in a discussion on overcoming life's challenges, under stress, which makes it more difficult to overcome obstacles in life. Patient was encouraged to identify life challenges and explore strategies for overcoming obstacles.         Collaboration of Care: Other: [facility]    Patient/Guardian was advised Release of  Information must be obtained prior to any record release in order to collaborate their care with an outside provider.   Patient/Guardian was advised if they have not already done so to contact the registration department to sign all necessary forms in order for us  to release information regarding their care.   Consent: Patient/Guardian gives verbal consent for treatment and assignment of benefits for services provided during this visit. Patient/Guardian expressed understanding and agreed to proceed.    Assessment and Plan: Counselor will continue to meet with patient to address treatment plan goals. Patient will continue to follow recommendations of providers and implement skills learned in session and practice coping skills between  Sessions.Diagnosis: depression and ptsd     Follow Up Instructions: I discussed the assessment and treatment plan with the patient. The patient was provided an opportunity to ask questions and all were answered. The patient agreed with the plan and demonstrated an understanding of the plan.   The patient was advised to call back or seek an in-person evaluation if the symptoms worsen or if the condition fails to improve as anticipated.   I provided 60 minutes of non-face-to-face time during the group encounter.     Trixie Maclaren S, LCAS

## 2024-02-09 ENCOUNTER — Ambulatory Visit (HOSPITAL_COMMUNITY): Admitting: Licensed Clinical Social Worker

## 2024-02-11 ENCOUNTER — Ambulatory Visit (INDEPENDENT_AMBULATORY_CARE_PROVIDER_SITE_OTHER): Admitting: Licensed Clinical Social Worker

## 2024-02-11 DIAGNOSIS — F331 Major depressive disorder, recurrent, moderate: Secondary | ICD-10-CM

## 2024-02-11 DIAGNOSIS — F4310 Post-traumatic stress disorder, unspecified: Secondary | ICD-10-CM | POA: Diagnosis not present

## 2024-02-14 ENCOUNTER — Encounter (HOSPITAL_COMMUNITY): Payer: Self-pay | Admitting: Licensed Clinical Social Worker

## 2024-02-14 NOTE — Progress Notes (Signed)
 Virtual Visit via video Note   I connected with Susan Guerra on 02/11/24 at 11-12pm by video enabled telemedicine application and verified that I am speaking with the correct person using two identifiers.   Location: Patient: home Provider: home office   I discussed the limitations of evaluation and management by telemedicine and the availability of in person appointments. The patient expressed understanding and agreed to proceed.   History of Present Illness: Susan Guerra is referred to therapy for PTSD (MST) and MDD by the [facility]. Her biggest stressors are her health, taking care of her children (15 & 6) transitioning out of her 12 year relationship. Susan Guerra has fibromyalgia and other health issues, which affects her ability to take care of all her family's needs.  She has previous therapy.  Treatment Goals Addressed: Susan Guerra will recall the traumatic event without becoming too overwhelmed with emotions 50% of the time, evidenced by self report. Meet with clinician in person weekly for therapy to monitor for progress towards goals and address any barriers to success; Reduce depression from average severity level of 6/10 down to a 4/10 in next 6 months by engaging in 1-2 positive coping skills daily as part of developing self-care routine; Reduce average anxiety level from 7/10 down to 5/10 in next 6 months by utilizing 1-2 relaxation skills/grounding skills per day, such as mindful breathing, progressive muscle relaxation, positive visualizations.  Progression towards Goal: Progressing      Observations/Objective: Patient presented for today's session on time and was alert, oriented x5, with no evidence or self-report of SI/HI or A/V H. Patient reported ongoing compliance with medication and denied any use of alcohol or illicit substances.  Clinician inquired about patient's current emotional ratings, as well as any significant changes in thoughts, feelings or behavior since previous session. Patient's  emotional ratings: 6/10 for depression, 6/10 for anxiety, 2/10 for anger/irritability, recalling traumatic event without becoming too overwhelmed with emotions 50% of the time. All ratings are evidenced by self report. Cln explored trauma emotional ratings with Susan Guerra who identified her trauma triggers and coping skills used this past week. Susan Guerra provided updates on health, family, x relationship, job, care taking for x family member, moving, unpacking. We discussed the impact these stressors have had on Susan Guerra as well as how she coped with them over the past  2 weeks. I'm exhausted still while unpacking and getting back to life. Cln asked open ended questions about her plans moving forward? Again, cln utilized CBT to assist Susan Guerra in decision making and problem solving, goals for her future.   Collaboration of Care: Other: Continue with providers at [facility]  Patient/Guardian was advised Release of Information must be obtained prior to any record release in order to collaborate their care with an outside provider.   Patient/Guardian was advised if they have not already done so to contact the registration department to sign all necessary forms in order for us  to release information regarding their care.   Consent: Patient/Guardian gives verbal consent for treatment and assignment of benefits for services provided during this visit. Patient/Guardian expressed understanding and agreed to proceed.   Assessment and Plan: Counselor will continue to meet with patient to address treatment plan goals. Patient will continue to follow recommendations of providers and implement skills learned in session and practice skills between sessions. Diagnosis: MDD and PTSD       Follow Up Instructions: I discussed the assessment and treatment plan with the patient. The patient was provided an opportunity to ask  questions and all were answered. The patient agreed with the plan and demonstrated an understanding of the plan.   The patient  was advised to call back or seek an in-person evaluation if the symptoms worsen or if the condition fails to improve as anticipated.   I provided 60 minutes of non-face-to-face time during this encounter.     Kallan Merrick S, LCAS

## 2024-02-15 ENCOUNTER — Ambulatory Visit (HOSPITAL_COMMUNITY): Admitting: Licensed Clinical Social Worker

## 2024-02-15 ENCOUNTER — Encounter (HOSPITAL_COMMUNITY): Payer: Self-pay

## 2024-02-22 ENCOUNTER — Encounter (HOSPITAL_COMMUNITY): Payer: Self-pay | Admitting: Licensed Clinical Social Worker

## 2024-02-22 ENCOUNTER — Ambulatory Visit (INDEPENDENT_AMBULATORY_CARE_PROVIDER_SITE_OTHER): Admitting: Licensed Clinical Social Worker

## 2024-02-22 DIAGNOSIS — F4310 Post-traumatic stress disorder, unspecified: Secondary | ICD-10-CM | POA: Diagnosis not present

## 2024-02-22 DIAGNOSIS — F331 Major depressive disorder, recurrent, moderate: Secondary | ICD-10-CM

## 2024-02-22 DIAGNOSIS — F32A Depression, unspecified: Secondary | ICD-10-CM | POA: Diagnosis not present

## 2024-02-22 NOTE — Progress Notes (Signed)
 Virtual GroupVisit via video Note   I connected with Susan Guerra on 02/22/24 at 5:00-6:30 pm during group session by video enabled telemedicine application and verified that I am speaking with the correct person using two identifiers.   Location: Patient: home Provider: home office   I discussed the limitations of evaluation and management by telemedicine and the availability of in person appointments. The patient expressed understanding and agreed to proceed.   History of Present Illness: Pt is referred to therapy for PTSD (MST) and MDD by the [facility]. Her biggest stressors are her health, taking care of her children (15 & 6) transitioning out of her 12 year relationship. Pt has fibromyalgia and other health issues, which affects her ability to take care of all her family's needs.  She has previous therapy.  Treatment Goals Addressed: Pt will recall the traumatic event without becoming too overwhelmed with emotions 50% of the time, evidenced by self report. Meet with clinician in person weekly for therapy to monitor for progress towards goals and address any barriers to success; Reduce depression from average severity level of 6/10 down to a 4/10 in next 90 days by engaging in 1-2 positive coping skills daily as part of developing self-care routine; Reduce average anxiety level from 7/10 down to 5/10 in next 90 days by utilizing 1-2 relaxation skills/grounding skills per day, such as mindful breathing, progressive muscle relaxation, positive visualizations.  Progression towards Goal: Progressing     Observations/Objective:    Patient participated in a group discussion on the stressors of fireworks during the July 4th celebration over the weekend. Patient discussed how the fireworks affected him during the holiday, affecting his mental health and PTSD symptoms. Patient was encouraged to use his identified coping skills to assist during the fireworks celebrations.       Coll[facility]ation [facility]: [facility]    Patient/Guardian was advised Release of Information must be obtained prior to any record release in order to collaborate their care with an outside provider.   Patient/Guardian was advised if they have not already done so to contact the registration department to sign all necessary forms in order for us  to release information regarding their care.   Consent: Patient/Guardian gives verbal consent for treatment and assignment of benefits for services provided during this visit. Patient/Guardian expressed understanding and agreed to proceed.    Assessment and Plan: Counselor will continue to meet with patient to address treatment plan goals. Patient will continue to follow recommendations of providers and implement skills learned in session and practice coping skills between  Sessions.Diagnosis: depression and ptsd     Follow Up Instructions: I discussed the assessment and treatment plan with the patient. The patient was provided an opportunity to ask questions and all were answered. The patient agreed with the plan and demonstrated an understanding of the plan.   The patient was advised to call back or seek an in-person evaluation if the symptoms worsen or if the condition fails to improve as anticipated.   I provided 90 minutes of non-face-to-face time during the group encounter.     Vidal Lampkins S, LCAS

## 2024-02-23 ENCOUNTER — Ambulatory Visit (HOSPITAL_COMMUNITY): Admitting: Licensed Clinical Social Worker

## 2024-02-24 ENCOUNTER — Ambulatory Visit (HOSPITAL_COMMUNITY): Admitting: Licensed Clinical Social Worker

## 2024-02-24 ENCOUNTER — Encounter (HOSPITAL_COMMUNITY): Payer: Self-pay

## 2024-02-29 ENCOUNTER — Encounter (HOSPITAL_COMMUNITY): Payer: Self-pay | Admitting: Licensed Clinical Social Worker

## 2024-02-29 ENCOUNTER — Ambulatory Visit (HOSPITAL_COMMUNITY): Admitting: Licensed Clinical Social Worker

## 2024-02-29 DIAGNOSIS — F4310 Post-traumatic stress disorder, unspecified: Secondary | ICD-10-CM | POA: Diagnosis not present

## 2024-02-29 DIAGNOSIS — F32A Depression, unspecified: Secondary | ICD-10-CM

## 2024-02-29 DIAGNOSIS — F331 Major depressive disorder, recurrent, moderate: Secondary | ICD-10-CM

## 2024-02-29 NOTE — Progress Notes (Signed)
 Virtual GroupVisit via video Note   I connected with [name] on 02/22/24 at 5:00-6:30 pm during group session by video enabled telemedicine application and verified that I am speaking with the correct person using two identifiers.   Location: Patient: home Provider: home office   I discussed the limitations of evaluation and management by telemedicine and the availability of in person appointments. The patient expressed understanding and agreed to proceed.   History of Present Illness: Pt is referred to therapy for PTSD (MST) and MDD by the [facility]. Her biggest stressors are her health, taking care of her children (15 & 6) transitioning out of her 12 year relationship. Pt has fibromyalgia and other health issues, which affects her ability to take care of all her family's needs.  She has previous therapy.  Treatment Goals Addressed: Pt will recall the traumatic event without becoming too overwhelmed with emotions 50% of the time, evidenced by self report. Meet with clinician in person weekly for therapy to monitor for progress towards goals and address any barriers to success; Reduce depression from average severity level of 6/10 down to a 4/10 in next 90 days by engaging in 1-2 positive coping skills daily as part of developing self-care routine; Reduce average anxiety level from 7/10 down to 5/10 in next 90 days by utilizing 1-2 relaxation skills/grounding skills per day, such as mindful breathing, progressive muscle relaxation, positive visualizations.  Progression towards Goal: Progressing     Observations/Objective:   Patient participated in a group discussion on dealing with chronic pain. Clinician used CBT to assist patients in improving their quality of life, activities of daily living, focusing on reducing pain and distress by modifying physical sensation, catastrophic thinking and maladaptive behaviors. Patient was encouraged to improve quality of life.       Collaboration of Care: Other: [facility]    Patient/Guardian was advised Release of Information must be obtained prior to any record release in order to collaborate their care with an outside provider.   Patient/Guardian was advised if they have not already done so to contact the registration department to sign all necessary forms in order for us  to release information regarding their care.   Consent: Patient/Guardian gives verbal consent for treatment and assignment of benefits for services provided during this visit. Patient/Guardian expressed understanding and agreed to proceed.    Assessment and Plan: Counselor will continue to meet with patient to address treatment plan goals. Patient will continue to follow recommendations of providers and implement skills learned in session and practice coping skills between  Sessions.Diagnosis: depression and ptsd     Follow Up Instructions: I discussed the assessment and treatment plan with the patient. The patient was provided an opportunity to ask questions and all were answered. The patient agreed with the plan and demonstrated an understanding of the plan.   The patient was advised to call back or seek an in-person evaluation if the symptoms worsen or if the condition fails to improve as anticipated.   I provided 90 minutes of non-face-to-face time during the group encounter.     Senna Lape S, LCAS

## 2024-03-01 ENCOUNTER — Encounter (HOSPITAL_COMMUNITY): Payer: Self-pay | Admitting: Licensed Clinical Social Worker

## 2024-03-01 ENCOUNTER — Ambulatory Visit (HOSPITAL_COMMUNITY): Admitting: Licensed Clinical Social Worker

## 2024-03-01 DIAGNOSIS — F4310 Post-traumatic stress disorder, unspecified: Secondary | ICD-10-CM

## 2024-03-01 DIAGNOSIS — F329 Major depressive disorder, single episode, unspecified: Secondary | ICD-10-CM

## 2024-03-01 DIAGNOSIS — F331 Major depressive disorder, recurrent, moderate: Secondary | ICD-10-CM

## 2024-03-03 ENCOUNTER — Encounter (HOSPITAL_COMMUNITY): Payer: Self-pay | Admitting: Licensed Clinical Social Worker

## 2024-03-03 NOTE — Progress Notes (Signed)
 Virtual Visit via video Note   I connected with Susan Guerra on 03/01/24 at 10-11 am by video enabled telemedicine application and verified that I am speaking with the correct person using two identifiers.   Location: Patient: home Provider: home office   I discussed the limitations of evaluation and management by telemedicine and the availability of in person appointments. The patient expressed understanding and agreed to proceed.   History of Present Illness: Pt is referred to therapy for PTSD (MST) and MDD by the [facility]. Her biggest stressors are her health, taking care of her children (15 & 6) transitioning out of her 12 year relationship. Pt has fibromyalgia and other health issues, which affects her ability to take care of all her family's needs.  She has previous therapy.  Treatment Goals Addressed: Pt will recall the traumatic event without becoming too overwhelmed with emotions 50% of the time, evidenced by self report. Meet with clinician in person weekly for therapy to monitor for progress towards goals and address any barriers to success; Reduce depression from average severity level of 6/10 down to a 4/10 in next 6 months by engaging in 1-2 positive coping skills daily as part of developing self-care routine; Reduce average anxiety level from 7/10 down to 5/10 in next 6 months by utilizing 1-2 relaxation skills/grounding skills per day, such as mindful breathing, progressive muscle relaxation, positive visualizations.  Progression towards Goal: Progressing      Observations/Objective: Patient presented for today's session on time and was alert, oriented x5, with no evidence or self-report of SI/HI or A/V H. Patient reported ongoing compliance with medication and denied any use of alcohol or illicit substances.  Clinician inquired about patient's current emotional ratings, as well as any significant changes in thoughts, feelings or behavior since previous session. Patient's  emotional ratings: 6/10 for depression, 6/10 for anxiety, 2/10 for anger/irritability, recalling traumatic event without becoming too overwhelmed with emotions 50% of the time. All ratings are evidenced by self report. Cln explored trauma emotional ratings with pt who identified her trauma triggers and coping skills used this past week. Pt provided updates on health, family, x relationship, job, care taking for x family member,  unpacking. We discussed the impact these stressors have had on pt as well as how she coped with them over the past  2 weeks. I'm doing better this week, my sister has been here all week and we all loved having her. She makes my life easier and I feel emotionally better.  Clinician used CBT to assist pt in  processing  thoughts, feelings, and behaviors. Clinician discussed coping skills and options for supporting herself through these challenging times.   Collaboration of Care: Other: Continue with providers at [facility]  Patient/Guardian was advised Release of Information must be obtained prior to any record release in order to collaborate their care with an outside provider.   Patient/Guardian was advised if they have not already done so to contact the registration department to sign all necessary forms in order for us  to release information regarding their care.   Consent: Patient/Guardian gives verbal consent for treatment and assignment of benefits for services provided during this visit. Patient/Guardian expressed understanding and agreed to proceed.   Assessment and Plan: Counselor will continue to meet with patient to address treatment plan goals. Patient will continue to follow recommendations of providers and implement skills learned in session and practice skills between sessions. Diagnosis: MDD and PTSD       Follow  Up Instructions: I discussed the assessment and treatment plan with the patient. The patient was provided an opportunity to ask questions and all were  answered. The patient agreed with the plan and demonstrated an understanding of the plan.   The patient was advised to call back or seek an in-person evaluation if the symptoms worsen or if the condition fails to improve as anticipated.   I provided 60 minutes of non-face-to-face time during this encounter.     Hanan Moen S, LCAS

## 2024-03-07 ENCOUNTER — Encounter (HOSPITAL_COMMUNITY): Payer: Self-pay

## 2024-03-07 ENCOUNTER — Ambulatory Visit (HOSPITAL_COMMUNITY): Admitting: Licensed Clinical Social Worker

## 2024-03-08 ENCOUNTER — Ambulatory Visit (INDEPENDENT_AMBULATORY_CARE_PROVIDER_SITE_OTHER): Admitting: Licensed Clinical Social Worker

## 2024-03-08 DIAGNOSIS — F339 Major depressive disorder, recurrent, unspecified: Secondary | ICD-10-CM | POA: Diagnosis not present

## 2024-03-08 DIAGNOSIS — F331 Major depressive disorder, recurrent, moderate: Secondary | ICD-10-CM

## 2024-03-08 DIAGNOSIS — F4310 Post-traumatic stress disorder, unspecified: Secondary | ICD-10-CM

## 2024-03-10 ENCOUNTER — Encounter (HOSPITAL_COMMUNITY): Payer: Self-pay | Admitting: Licensed Clinical Social Worker

## 2024-03-13 NOTE — Progress Notes (Signed)
 Virtual Visit via video Note   I connected with Susan Guerra on 03/08/24 at 10-11 am by video enabled telemedicine application and verified that I am speaking with the correct person using two identifiers.   Location: Patient: home Provider: home office   I discussed the limitations of evaluation and management by telemedicine and the availability of in person appointments. The patient expressed understanding and agreed to proceed.   History of Present Illness: Pt is referred to therapy for PTSD (MST) and MDD by the [facility]. Her biggest stressors are her health, taking care of her children (15 & 6) transitioning out of her 12 year relationship. Pt has fibromyalgia and other health issues, which affects her ability to take care of all her family's needs.  She has previous therapy.  Treatment Goals Addressed: Pt will recall the traumatic event without becoming too overwhelmed with emotions 50% of the time, evidenced by self report. Meet with clinician in person weekly for therapy to monitor for progress towards goals and address any barriers to success; Reduce depression from average severity level of 6/10 down to a 4/10 in next 6 months by engaging in 1-2 positive coping skills daily as part of developing self-care routine; Reduce average anxiety level from 7/10 down to 5/10 in next 6 months by utilizing 1-2 relaxation skills/grounding skills per day, such as mindful breathing, progressive muscle relaxation, positive visualizations.  Progression towards Goal: Progressing      Observations/Objective: Patient presented for today's session on time and was alert, oriented x5, with no evidence or self-report of SI/HI or A/V H. Patient reported ongoing compliance with medication and denied any use of alcohol or illicit substances.  Clinician inquired about patient's current emotional ratings, as well as any significant changes in thoughts, feelings or behavior since previous session. Patient's  emotional ratings: 6/10 for depression, 6/10 for anxiety, 2/10 for anger/irritability, recalling traumatic event without becoming too overwhelmed with emotions 50% of the time. All ratings are evidenced by self report. Cln explored trauma emotional ratings with pt who identified her trauma triggers and coping skills used this past week. Pt provided updates on health, family, x relationship, job, care taking for x family member,  unpacking.boxes. We discussed the impact these stressors have had on pt as well as how she coped with them over the past  week. I'm doing ok this week, grieving anniversary of parents' death dates and heavenly birthdays.Cln asked open ended questions and provided psychoeducation on grief (death firsts) Clinician used CBT to assist pt in  processing  thoughts, feelings, and behaviors.    Collaboration of Care: Other: Continue with providers at [facility]  Patient/Guardian was advised Release of Information must be obtained prior to any record release in order to collaborate their care with an outside provider.   Patient/Guardian was advised if they have not already done so to contact the registration department to sign all necessary forms in order for us  to release information regarding their care.   Consent: Patient/Guardian gives verbal consent for treatment and assignment of benefits for services provided during this visit. Patient/Guardian expressed understanding and agreed to proceed.   Assessment and Plan: Counselor will continue to meet with patient to address treatment plan goals. Patient will continue to follow recommendations of providers and implement skills learned in session and practice skills between sessions. Diagnosis: MDD and PTSD       Follow Up Instructions: I discussed the assessment and treatment plan with the patient. The patient was provided an  opportunity to ask questions and all were answered. The patient agreed with the plan and demonstrated an  understanding of the plan.   The patient was advised to call back or seek an in-person evaluation if the symptoms worsen or if the condition fails to improve as anticipated.   I provided 60 minutes of non-face-to-face time during this encounter.     Latonya Nelon S, LCAS

## 2024-03-14 ENCOUNTER — Encounter (HOSPITAL_COMMUNITY): Payer: Self-pay | Admitting: Licensed Clinical Social Worker

## 2024-03-14 ENCOUNTER — Ambulatory Visit (HOSPITAL_COMMUNITY): Admitting: Licensed Clinical Social Worker

## 2024-03-14 DIAGNOSIS — F32A Depression, unspecified: Secondary | ICD-10-CM

## 2024-03-14 DIAGNOSIS — F4310 Post-traumatic stress disorder, unspecified: Secondary | ICD-10-CM | POA: Diagnosis not present

## 2024-03-14 DIAGNOSIS — F331 Major depressive disorder, recurrent, moderate: Secondary | ICD-10-CM

## 2024-03-14 NOTE — Progress Notes (Signed)
 Virtual GroupVisit via video Note   I connected with Susan Guerra on 03/14/24 at 5:00-7:00 pm during group session by video enabled telemedicine application and verified that I am speaking with the correct person using two identifiers.   Location: Patient: home Provider: home office   I discussed the limitations of evaluation and management by telemedicine and the availability of in person appointments. The patient expressed understanding and agreed to proceed.   History of Present Illness: Pt is referred to therapy for PTSD (MST) and MDD by the [facility]. Her biggest stressors are her health, taking care of her children (15 & 6) transitioning out of her 12 year relationship. Pt has fibromyalgia and other health issues, which affects her ability to take care of all her family's needs.  She has previous therapy.  Treatment Goals Addressed: Pt will recall the traumatic event without becoming too overwhelmed with emotions 50% of the time, evidenced by self report. Meet with clinician in person weekly for therapy to monitor for progress towards goals and address any barriers to success; Reduce depression from average severity level of 6/10 down to a 4/10 in next 90 days by engaging in 1-2 positive coping skills daily as part of developing self-care routine; Reduce average anxiety level from 7/10 down to 5/10 in next 90 days by utilizing 1-2 relaxation skills/grounding skills per day, such as mindful breathing, progressive muscle relaxation, positive visualizations.  Progression towards Goal: Progressing     Observations/Objective:   Patient participated in a group discussion on dealing with chronic pain. Clinician used CBT to assist patients in improving their quality of life, activities of daily living, focusing on reducing pain and distress by modifying physical sensation, catastrophic thinking and maladaptive behaviors. Patient was encouraged to improve quality of life.       Collaboration of Care: Other: [facility]    Patient/Guardian was advised Release of Information must be obtained prior to any record release in order to collaborate their care with an outside provider.   Patient/Guardian was advised if they have not already done so to contact the registration department to sign all necessary forms in order for us  to release information regarding their care.   Consent: Patient/Guardian gives verbal consent for treatment and assignment of benefits for services provided during this visit. Patient/Guardian expressed understanding and agreed to proceed.    Assessment and Plan: Counselor will continue to meet with patient to address treatment plan goals. Patient will continue to follow recommendations of providers and implement skills learned in session and practice coping skills between  Sessions.Diagnosis: depression and ptsd     Follow Up Instructions: I discussed the assessment and treatment plan with the patient. The patient was provided an opportunity to ask questions and all were answered. The patient agreed with the plan and demonstrated an understanding of the plan.   The patient was advised to call back or seek an in-person evaluation if the symptoms worsen or if the condition fails to improve as anticipated.   I provided 120 minutes of non-face-to-face time during the group encounter.     Ashleynicole Mcclees S, LCAS

## 2024-03-21 ENCOUNTER — Ambulatory Visit (HOSPITAL_COMMUNITY): Admitting: Licensed Clinical Social Worker

## 2024-03-21 ENCOUNTER — Encounter (HOSPITAL_COMMUNITY): Payer: Self-pay | Admitting: Licensed Clinical Social Worker

## 2024-03-21 DIAGNOSIS — F32A Depression, unspecified: Secondary | ICD-10-CM | POA: Diagnosis not present

## 2024-03-21 DIAGNOSIS — F4310 Post-traumatic stress disorder, unspecified: Secondary | ICD-10-CM

## 2024-03-21 DIAGNOSIS — F331 Major depressive disorder, recurrent, moderate: Secondary | ICD-10-CM

## 2024-03-21 NOTE — Progress Notes (Signed)
 Virtual GroupVisit via video Note   I connected with Susan Guerra on 03/21/24 at 5:00-6:30 pm during group session by video enabled telemedicine application and verified that I am speaking with the correct person using two identifiers.   Location: Patient: home Provider: home office   I discussed the limitations of evaluation and management by telemedicine and the availability of in person appointments. The patient expressed understanding and agreed to proceed.   History of Present Illness: Pt is referred to therapy for PTSD (MST) and MDD by the [facility]. Her biggest stressors are her health, taking care of her children (15 & 6) transitioning out of her 12 year relationship. Pt has fibromyalgia and other health issues, which affects her ability to take care of all her family's needs.  She has previous therapy.  Treatment Goals Addressed: Pt will recall the traumatic event without becoming too overwhelmed with emotions 50% of the time, evidenced by self report. Meet with clinician in person weekly for therapy to monitor for progress towards goals and address any barriers to success; Reduce depression from average severity level of 6/10 down to a 4/10 in next 90 days by engaging in 1-2 positive coping skills daily as part of developing self-care routine; Reduce average anxiety level from 7/10 down to 5/10 in next 90 days by utilizing 1-2 relaxation skills/grounding skills per day, such as mindful breathing, progressive muscle relaxation, positive visualizations.  Progression towards Goal: Progressing     Observations/Objective:   Pt participated in a group discussion on change in relationships, which may mean letting go of dysfunctional relationship patterns, irrational beliefs and self-sabotaging behaviors and then replacing them with more positive beliefs and relationship patterns. Patient was encouraged to embrace change.     Collaboration of Care: Other[facility]    Patient/Guardian was advised Release of Information must be obtained prior to any record release in order to collaborate their care with an outside provider.   Patient/Guardian was advised if they have not already done so to contact the registration department to sign all necessary forms in order for us  to release information regarding their care.   Consent: Patient/Guardian gives verbal consent for treatment and assignment of benefits for services provided during this visit. Patient/Guardian expressed understanding and agreed to proceed.    Assessment and Plan: Counselor will continue to meet with patient to address treatment plan goals. Patient will continue to follow recommendations of providers and implement skills learned in session and practice coping skills between  Sessions.Diagnosis: depression and ptsd     Follow Up Instructions: I discussed the assessment and treatment plan with the patient. The patient was provided an opportunity to ask questions and all were answered. The patient agreed with the plan and demonstrated an understanding of the plan.   The patient was advised to call back or seek an in-person evaluation if the symptoms worsen or if the condition fails to improve as anticipated.   I provided 90 minutes of non-face-to-face time during the group encounter.     Shamarion Coots S, LCAS

## 2024-03-22 ENCOUNTER — Ambulatory Visit (HOSPITAL_COMMUNITY): Admitting: Licensed Clinical Social Worker

## 2024-03-22 ENCOUNTER — Encounter (HOSPITAL_COMMUNITY): Payer: Self-pay | Admitting: Licensed Clinical Social Worker

## 2024-03-22 DIAGNOSIS — F331 Major depressive disorder, recurrent, moderate: Secondary | ICD-10-CM | POA: Diagnosis not present

## 2024-03-22 DIAGNOSIS — F4310 Post-traumatic stress disorder, unspecified: Secondary | ICD-10-CM | POA: Diagnosis not present

## 2024-03-22 NOTE — Progress Notes (Signed)
 Virtual Visit via video Note   I connected with [name] on 03/22/24 at 3-4 pm by video enabled telemedicine application and verified that I am speaking with the correct person using two identifiers.   Location: Patient: home Provider: home office   I discussed the limitations of evaluation and management by telemedicine and the availability of in person appointments. The patient expressed understanding and agreed to proceed.   History of Present Illness: Pt is referred to therapy for PTSD (MST) and MDD by the [facility]. Her biggest stressors are her health, taking care of her children (15 & 6) transitioning out of her 12 year relationship. Pt has fibromyalgia and other health issues, which affects her ability to take care of all her family's needs.  She has previous therapy.  Treatment Goals Addressed: Pt will recall the traumatic event without becoming too overwhelmed with emotions 50% of the time, evidenced by self report. Meet with clinician in person weekly for therapy to monitor for progress towards goals and address any barriers to success; Reduce depression from average severity level of 6/10 down to a 4/10 in next 6 months by engaging in 1-2 positive coping skills daily as part of developing self-care routine; Reduce average anxiety level from 7/10 down to 5/10 in next 6 months by utilizing 1-2 relaxation skills/grounding skills per day, such as mindful breathing, progressive muscle relaxation, positive visualizations.  Progression towards Goal: Progressing      Observations/Objective: Patient presented for today's session on time and was alert, oriented x5, with no evidence or self-report of SI/HI or A/V H. Patient reported ongoing compliance with medication and denied any use of alcohol or illicit substances.  Clinician inquired about patient's current emotional ratings, as well as any significant changes in thoughts, feelings or behavior since previous session. Patient's  emotional ratings: 6/10 for depression, 6/10 for anxiety, 2/10 for anger/irritability, recalling traumatic event without becoming too overwhelmed with emotions 50% of the time. All ratings are evidenced by self report. Cln explored trauma emotional ratings with pt who identified her trauma triggers and coping skills used this past week. Pt provided updates on health, family, x relationship, job, care taking for x family member, unpacking.boxes. We discussed the impact these stressors have had on pt as well as how she coped with them over the past  week. last night I had an emotional breakdown from dealing with my children who are ungrateful, self absorbed and self centered. Cln asked open ended questions. Clinician utilized MI OARS to reflect and summarize thoughts and feelings.   Clinician used CBT to assist pt in  processing  thoughts, feelings, and behaviors.    Collaboration of Care: Other: Continue with providers at [facility]  Patient/Guardian was advised Release of Information must be obtained prior to any record release in order to collaborate their care with an outside provider.   Patient/Guardian was advised if they have not already done so to contact the registration department to sign all necessary forms in order for us  to release information regarding their care.   Consent: Patient/Guardian gives verbal consent for treatment and assignment of benefits for services provided during this visit. Patient/Guardian expressed understanding and agreed to proceed.   Assessment and Plan: Counselor will continue to meet with patient to address treatment plan goals. Patient will continue to follow recommendations of providers and implement skills learned in session and practice skills between sessions. Diagnosis: MDD and PTSD       Follow Up Instructions: I discussed the  assessment and treatment plan with the patient. The patient was provided an opportunity to ask questions and all were answered. The  patient agreed with the plan and demonstrated an understanding of the plan.   The patient was advised to call back or seek an in-person evaluation if the symptoms worsen or if the condition fails to improve as anticipated.   I provided 60 minutes of non-face-to-face time during this encounter.     Yasamin Karel S, LCAS

## 2024-03-28 ENCOUNTER — Ambulatory Visit (HOSPITAL_COMMUNITY): Admitting: Licensed Clinical Social Worker

## 2024-03-28 ENCOUNTER — Encounter (HOSPITAL_COMMUNITY): Payer: Self-pay | Admitting: Licensed Clinical Social Worker

## 2024-03-28 DIAGNOSIS — F4310 Post-traumatic stress disorder, unspecified: Secondary | ICD-10-CM | POA: Diagnosis not present

## 2024-03-28 DIAGNOSIS — F32A Depression, unspecified: Secondary | ICD-10-CM | POA: Diagnosis not present

## 2024-03-28 DIAGNOSIS — F331 Major depressive disorder, recurrent, moderate: Secondary | ICD-10-CM

## 2024-03-28 NOTE — Progress Notes (Signed)
 Virtual GroupVisit via video Note   I connected with Susan Guerra on 03/28/24 at 5:30-6:30 pm during group session by video enabled telemedicine application and verified that I am speaking with the correct person using two identifiers.   Location: Patient: home Provider: home office   I discussed the limitations of evaluation and management by telemedicine and the availability of in person appointments. The patient expressed understanding and agreed to proceed.   History of Present Illness: Pt is referred to therapy for PTSD (MST) and MDD by the [facility]. Her biggest stressors are her health, taking care of her children (15 & 6) transitioning out of her 12 year relationship. Pt has fibromyalgia and other health issues, which affects her ability to take care of all her family's needs.  She has previous therapy.  Treatment Goals Addressed: Pt will recall the traumatic event without becoming too overwhelmed with emotions 50% of the time, evidenced by self report. Meet with clinician in person weekly for therapy to monitor for progress towards goals and address any barriers to success; Reduce depression from average severity level of 6/10 down to a 4/10 in next 90 days by engaging in 1-2 positive coping skills daily as part of developing self-care routine; Reduce average anxiety level from 7/10 down to 5/10 in next 90 days by utilizing 1-2 relaxation skills/grounding skills per day, such as mindful breathing, progressive muscle relaxation, positive visualizations.  Progression towards Goal: Progressing     Observations/Objective:    Patient participated in a group discussion on dealing with health issues, including chronic pain. Cln used CBT to assist patient in improving the quality of life, activities of daily living, focusing on reducing stress by modifying physical sensation, catastrophic thinking and maladaptive behaviors. Patient was encouraged to improve quality of life.     Collaboration of Care: Other: [facility]    Patient/Guardian was advised Release of Information must be obtained prior to any record release in order to collaborate their care with an outside provider.   Patient/Guardian was advised if they have not already done so to contact the registration department to sign all necessary forms in order for us  to release information regarding their care.   Consent: Patient/Guardian gives verbal consent for treatment and assignment of benefits for services provided during this visit. Patient/Guardian expressed understanding and agreed to proceed.    Assessment and Plan: Counselor will continue to meet with patient to address treatment plan goals. Patient will continue to follow recommendations of providers and implement skills learned in session and practice coping skills between  Sessions.Diagnosis: depression and ptsd     Follow Up Instructions: I discussed the assessment and treatment plan with the patient. The patient was provided an opportunity to ask questions and all were answered. The patient agreed with the plan and demonstrated an understanding of the plan.   The patient was advised to call back or seek an in-person evaluation if the symptoms worsen or if the condition fails to improve as anticipated.   I provided 60 minutes of non-face-to-face time during the group encounter.     Jachob Mcclean S, LCAS

## 2024-04-04 ENCOUNTER — Ambulatory Visit (HOSPITAL_COMMUNITY): Admitting: Licensed Clinical Social Worker

## 2024-04-04 ENCOUNTER — Encounter (HOSPITAL_COMMUNITY): Payer: Self-pay | Admitting: Licensed Clinical Social Worker

## 2024-04-04 DIAGNOSIS — F331 Major depressive disorder, recurrent, moderate: Secondary | ICD-10-CM

## 2024-04-04 DIAGNOSIS — F4310 Post-traumatic stress disorder, unspecified: Secondary | ICD-10-CM

## 2024-04-04 NOTE — Addendum Note (Signed)
 Addended by: Tye Juarez S on: 04/04/2024 06:16 PM   Modules accepted: Level of Service

## 2024-04-04 NOTE — Progress Notes (Signed)
 Virtual GroupVisit via video Note   I connected with Susan Guerra on 04/04/24 at 5:30-6:30 pm during group session by video enabled telemedicine application and verified that I am speaking with the correct person using two identifiers.   Location: Patient: home Provider: home office   I discussed the limitations of evaluation and management by telemedicine and the availability of in person appointments. The patient expressed understanding and agreed to proceed.   History of Present Illness: Pt is referred to therapy for PTSD (MST) and MDD by the [facility]. Her biggest stressors are her health, taking care of her children (15 & 6) transitioning out of her 12 year relationship. Pt has fibromyalgia and other health issues, which affects her ability to take care of all her family's needs.  She has previous therapy.  Treatment Goals Addressed: Pt will recall the traumatic event without becoming too overwhelmed with emotions 50% of the time, evidenced by self report. Meet with clinician in person weekly for therapy to monitor for progress towards goals and address any barriers to success; Reduce depression from average severity level of 6/10 down to a 4/10 in next 90 days by engaging in 1-2 positive coping skills daily as part of developing self-care routine; Reduce average anxiety level from 7/10 down to 5/10 in next 90 days by utilizing 1-2 relaxation skills/grounding skills per day, such as mindful breathing, progressive muscle relaxation, positive visualizations.  Progression towards Goal: Progressing     Observations/Objective:     Patient participated in a group discussion on overcoming life's challenges, under stress, which makes it more difficult to overcome obstacles in life. Patient was encouraged to identify life challenges and explore strategies for overcoming obstacles.        Collaboration of Care: Other: [facility]    Patient/Guardian was advised  Release of Information must be obtained prior to any record release in order to collaborate their care with an outside provider.   Patient/Guardian was advised if they have not already done so to contact the registration department to sign all necessary forms in order for us  to release information regarding their care.   Consent: Patient/Guardian gives verbal consent for treatment and assignment of benefits for services provided during this visit. Patient/Guardian expressed understanding and agreed to proceed.    Assessment and Plan: Counselor will continue to meet with patient to address treatment plan goals. Patient will continue to follow recommendations of providers and implement skills learned in session and practice coping skills between  Sessions.Diagnosis: depression and ptsd     Follow Up Instructions: I discussed the assessment and treatment plan with the patient. The patient was provided an opportunity to ask questions and all were answered. The patient agreed with the plan and demonstrated an understanding of the plan.   The patient was advised to call back or seek an in-person evaluation if the symptoms worsen or if the condition fails to improve as anticipated.   I provided 60 minutes of non-face-to-face time during the group encounter.     Susan Guerra S, LCAS

## 2024-04-05 ENCOUNTER — Ambulatory Visit (HOSPITAL_COMMUNITY): Admitting: Licensed Clinical Social Worker

## 2024-04-07 ENCOUNTER — Ambulatory Visit (INDEPENDENT_AMBULATORY_CARE_PROVIDER_SITE_OTHER): Admitting: Licensed Clinical Social Worker

## 2024-04-07 DIAGNOSIS — F4310 Post-traumatic stress disorder, unspecified: Secondary | ICD-10-CM

## 2024-04-07 DIAGNOSIS — F331 Major depressive disorder, recurrent, moderate: Secondary | ICD-10-CM

## 2024-04-10 ENCOUNTER — Encounter (HOSPITAL_COMMUNITY): Payer: Self-pay | Admitting: Licensed Clinical Social Worker

## 2024-04-10 NOTE — Progress Notes (Signed)
 Virtual Visit via video Note   I connected with Susan Guerra on 03/2124 at 10-11am by video enabled telemedicine application and verified that I am speaking with the correct person using two identifiers.   Location: Patient: home Provider: home office   I discussed the limitations of evaluation and management by telemedicine and the availability of in person appointments. The patient expressed understanding and agreed to proceed.   History of Present Illness: Pt is referred to therapy for PTSD (MST) and MDD by the [facility]. Her biggest stressors are her health, taking care of her children (15 & 6) transitioning out of her 12 year relationship. Pt has fibromyalgia and other health issues, which affects her ability to take care of all her family'Guerra needs.  She has previous therapy.  Treatment Goals Addressed: Pt will recall the traumatic event without becoming too overwhelmed with emotions 50% of the time, evidenced by self report. Meet with clinician in person weekly for therapy to monitor for progress towards goals and address any barriers to success; Reduce depression from average severity level of 6/10 down to a 4/10 in next 6 months by engaging in 1-2 positive coping skills daily as part of developing self-care routine; Reduce average anxiety level from 7/10 down to 5/10 in next 6 months by utilizing 1-2 relaxation skills/grounding skills per day, such as mindful breathing, progressive muscle relaxation, positive visualizations.  Progression towards Goal: Progressing      Observations/Objective: Patient presented for today'Guerra session on time and was alert, oriented x5, with no evidence or self-report of SI/HI or A/V H. Patient reported ongoing compliance with medication and denied any use of alcohol or illicit substances.  Clinician inquired about patient'Guerra current emotional ratings, as well as any significant changes in thoughts, feelings or behavior since previous session. Patient'Guerra  emotional ratings: 6/10 for depression, 6/10 for anxiety, 2/10 for anger/irritability, recalling traumatic event without becoming too overwhelmed with emotions 50% of the time. All ratings are evidenced by self report. Cln explored trauma emotional ratings with pt who identified her trauma triggers and coping skills used this past week. Pt provided updates on health, family, x relationship, job, care taking for x family member, children.. We discussed the impact these stressors have had on pt as well as how she coped with them over the past  week. I'm doing better this week but getting my children ready for school is stressful because my children are ungrateful, self absorbed and self centered. Clinician used CBT to assist pt in  processing  thoughts, feelings, and behaviors while dealing with her continued stressors.Susan Guerra    Collaboration of Care: Other: Continue with providers at [facility]  Patient/Guardian was advised Release of Information must be obtained prior to any record release in order to collaborate their care with an outside provider.   Patient/Guardian was advised if they have not already done so to contact the registration department to sign all necessary forms in order for us  to release information regarding their care.   Consent: Patient/Guardian gives verbal consent for treatment and assignment of benefits for services provided during this visit. Patient/Guardian expressed understanding and agreed to proceed.   Assessment and Plan: Counselor will continue to meet with patient to address treatment plan goals. Patient will continue to follow recommendations of providers and implement skills learned in session and practice skills between sessions. Diagnosis: MDD and PTSD       Follow Up Instructions: I discussed the assessment and treatment plan with the patient. The patient  was provided an opportunity to ask questions and all were answered. The patient agreed with the plan and demonstrated an  understanding of the plan.   The patient was advised to call back or seek an in-person evaluation if the symptoms worsen or if the condition fails to improve as anticipated.   I provided 60 minutes of non-face-to-face time during this encounter.     Susan Guerra, LCAS

## 2024-04-11 ENCOUNTER — Encounter (HOSPITAL_COMMUNITY): Payer: Self-pay

## 2024-04-11 ENCOUNTER — Ambulatory Visit (HOSPITAL_COMMUNITY): Admitting: Licensed Clinical Social Worker

## 2024-04-11 NOTE — Addendum Note (Signed)
 Addended by: Darelle Kings S on: 04/11/2024 04:29 PM   Modules accepted: Level of Service

## 2024-04-18 ENCOUNTER — Ambulatory Visit (HOSPITAL_COMMUNITY): Admitting: Licensed Clinical Social Worker

## 2024-04-19 ENCOUNTER — Encounter (HOSPITAL_COMMUNITY): Payer: Self-pay

## 2024-04-19 ENCOUNTER — Ambulatory Visit (HOSPITAL_COMMUNITY): Admitting: Licensed Clinical Social Worker

## 2024-04-25 ENCOUNTER — Encounter (HOSPITAL_COMMUNITY): Payer: Self-pay

## 2024-04-25 ENCOUNTER — Ambulatory Visit (HOSPITAL_COMMUNITY): Admitting: Licensed Clinical Social Worker

## 2024-04-26 ENCOUNTER — Telehealth (HOSPITAL_COMMUNITY): Payer: Self-pay | Admitting: Psychiatry

## 2024-04-26 ENCOUNTER — Ambulatory Visit (HOSPITAL_COMMUNITY): Admitting: Licensed Clinical Social Worker

## 2024-04-26 NOTE — Telephone Encounter (Signed)
 D:  At 52, case manager placed a call to [facility] [phone] to discuss most recent authorization that will expire on 07-10-24.  The shara is an extension for pt's therapist [facility] [place]) to continue seeing pt on an outpt basis; but the shara states it is for Mental Heath Intensive Outpt instead of Outpt Therapy.  Case mgr was transferred to the nurse Romero Argel [phone]. [phone]) who is handling the patient's case; left vm for Ms. Argel re: situation. Ms. Joy called the cm back and left vm stating MH-Intensive will still work.  Reports she checked with [employer] and it's usable for this authorization [facility] [phone]).

## 2024-05-02 ENCOUNTER — Ambulatory Visit (HOSPITAL_COMMUNITY): Admitting: Licensed Clinical Social Worker

## 2024-05-02 ENCOUNTER — Encounter (HOSPITAL_COMMUNITY): Payer: Self-pay

## 2024-05-03 ENCOUNTER — Ambulatory Visit (HOSPITAL_COMMUNITY): Admitting: Licensed Clinical Social Worker

## 2024-05-05 ENCOUNTER — Ambulatory Visit (INDEPENDENT_AMBULATORY_CARE_PROVIDER_SITE_OTHER): Admitting: Licensed Clinical Social Worker

## 2024-05-05 DIAGNOSIS — F4310 Post-traumatic stress disorder, unspecified: Secondary | ICD-10-CM

## 2024-05-05 DIAGNOSIS — F329 Major depressive disorder, single episode, unspecified: Secondary | ICD-10-CM | POA: Diagnosis not present

## 2024-05-05 DIAGNOSIS — F331 Major depressive disorder, recurrent, moderate: Secondary | ICD-10-CM

## 2024-05-09 ENCOUNTER — Encounter (HOSPITAL_COMMUNITY): Payer: Self-pay

## 2024-05-09 ENCOUNTER — Ambulatory Visit (HOSPITAL_COMMUNITY): Admitting: Licensed Clinical Social Worker

## 2024-05-10 ENCOUNTER — Ambulatory Visit (HOSPITAL_COMMUNITY): Admitting: Licensed Clinical Social Worker

## 2024-05-11 ENCOUNTER — Encounter (HOSPITAL_COMMUNITY): Payer: Self-pay | Admitting: Licensed Clinical Social Worker

## 2024-05-11 NOTE — Progress Notes (Signed)
 Virtual Visit via video Note   I connected with Susan Guerra on 05/05/2024 at 11-12pm by video enabled telemedicine application and verified that I am speaking with the correct person using two identifiers. Pt returns to therapy after an absence due to her community care authorization.   Location: Patient: home Provider: home office   I discussed the limitations of evaluation and management by telemedicine and the availability of in person appointments. The patient expressed understanding and agreed to proceed.   History of Present Illness: Pt is referred to therapy for PTSD (MST) and MDD by the [facility]. Her biggest stressors are her health, taking care of her children (15 & 6) transitioning out of her 12 year relationship. Pt has fibromyalgia and other health issues, which affects her ability to take care of all her family's needs.  She has previous therapy.  Treatment Goals Addressed: Pt will recall the traumatic event without becoming too overwhelmed with emotions 50% of the time, evidenced by self report. Meet with clinician in person weekly for therapy to monitor for progress towards goals and address any barriers to success; Reduce depression from average severity level of 6/10 down to a 4/10 in next 6 months by engaging in 1-2 positive coping skills daily as part of developing self-care routine; Reduce average anxiety level from 7/10 down to 5/10 in next 6 months by utilizing 1-2 relaxation skills/grounding skills per day, such as mindful breathing, progressive muscle relaxation, positive visualizations.  Progression towards Goal: Progressing      Observations/Objective: Patient presented for today's session on time and was alert, oriented x5, with no evidence or self-report of SI/HI or A/V H. Patient reported ongoing compliance with medication and denied any use of alcohol or illicit substances.  Clinician inquired about patient's current emotional ratings, as well as any  significant changes in thoughts, feelings or behavior since previous session. Patient's emotional ratings: 6/10 for depression, 6/10 for anxiety, 2/10 for anger/irritability, recalling traumatic event without becoming too overwhelmed with emotions 50% of the time. All ratings are evidenced by self report. Cln explored trauma emotional ratings with pt who identified her trauma triggers and coping skills used. Pt provided updates on health, family, x relationship, job, care taking for x family member, children.. We discussed the impact these stressors have had on pt as well as how she coped with them.  Pt has been absent from therapy due to her community care authorization.  Again, clinician used CBT to help pt process thoughts, feelings, and behaviors of current life situation.     Collaboration of Care: Other: Continue with providers at [facility]  Patient/Guardian was advised Release of Information must be obtained prior to any record release in order to collaborate their care with an outside provider.   Patient/Guardian was advised if they have not already done so to contact the registration department to sign all necessary forms in order for us  to release information regarding their care.   Consent: Patient/Guardian gives verbal consent for treatment and assignment of benefits for services provided during this visit. Patient/Guardian expressed understanding and agreed to proceed.   Assessment and Plan: Counselor will continue to meet with patient to address treatment plan goals. Patient will continue to follow recommendations of providers and implement skills learned in session and practice skills between sessions. Diagnosis: MDD and PTSD       Follow Up Instructions: I discussed the assessment and treatment plan with the patient. The patient was provided an opportunity to ask questions and  all were answered. The patient agreed with the plan and demonstrated an understanding of the plan.   The  patient was advised to call back or seek an in-person evaluation if the symptoms worsen or if the condition fails to improve as anticipated.   I provided 60 minutes of non-face-to-face time during this encounter.     Jams Trickett S, LCAS

## 2024-05-16 ENCOUNTER — Encounter (HOSPITAL_COMMUNITY): Payer: Self-pay | Admitting: Licensed Clinical Social Worker

## 2024-05-16 ENCOUNTER — Ambulatory Visit (INDEPENDENT_AMBULATORY_CARE_PROVIDER_SITE_OTHER): Admitting: Licensed Clinical Social Worker

## 2024-05-16 DIAGNOSIS — F32A Depression, unspecified: Secondary | ICD-10-CM | POA: Diagnosis not present

## 2024-05-16 DIAGNOSIS — F331 Major depressive disorder, recurrent, moderate: Secondary | ICD-10-CM

## 2024-05-16 DIAGNOSIS — F4310 Post-traumatic stress disorder, unspecified: Secondary | ICD-10-CM | POA: Diagnosis not present

## 2024-05-16 NOTE — Progress Notes (Signed)
 Virtual GroupVisit via video Note   I connected with Susan Guerra on 05/16/24 at 5:00-6:30 pm during group session by video enabled telemedicine application and verified that I am speaking with the correct person using two identifiers.   Location: Patient: home Provider: home office   I discussed the limitations of evaluation and management by telemedicine and the availability of in person appointments. The patient expressed understanding and agreed to proceed.   History of Present Illness: Pt is referred to therapy for PTSD (MST) and MDD by the [facility]. Her biggest stressors are her health, taking care of her children (15 & 6) transitioning out of her 12 year relationship. Pt has fibromyalgia and other health issues, which affects her ability to take care of all her family's needs.  She has previous therapy.  Treatment Goals Addressed: Pt will recall the traumatic event without becoming too overwhelmed with emotions 50% of the time, evidenced by self report. Meet with clinician in person weekly for therapy to monitor for progress towards goals and address any barriers to success; Reduce depression from average severity level of 6/10 down to a 4/10 in next 90 days by engaging in 1-2 positive coping skills daily as part of developing self-care routine; Reduce average anxiety level from 7/10 down to 5/10 in next 90 days by utilizing 1-2 relaxation skills/grounding skills per day, such as mindful breathing, progressive muscle relaxation, positive visualizations.  Progression towards Goal: Progressing     Observations/Objective:    Patient participated in a group discussion on depression. Clinician provided education on the cycle of depression, depression medications available and alternatives.    Patient was encouraged to know triggers, symptoms and available coping skills.      Collaboration of Care: Other: [facility]    Patient/Guardian was advised Release of  Information must be obtained prior to any record release in order to collaborate their care with an outside provider.   Patient/Guardian was advised if they have not already done so to contact the registration department to sign all necessary forms in order for us  to release information regarding their care.   Consent: Patient/Guardian gives verbal consent for treatment and assignment of benefits for services provided during this visit. Patient/Guardian expressed understanding and agreed to proceed.    Assessment and Plan: Counselor will continue to meet with patient to address treatment plan goals. Patient will continue to follow recommendations of providers and implement skills learned in session and practice coping skills between  Sessions.Diagnosis: depression and ptsd     Follow Up Instructions: I discussed the assessment and treatment plan with the patient. The patient was provided an opportunity to ask questions and all were answered. The patient agreed with the plan and demonstrated an understanding of the plan.   The patient was advised to call back or seek an in-person evaluation if the symptoms worsen or if the condition fails to improve as anticipated.   I provided 90 minutes of non-face-to-face time during the group encounter.     Amenda Duclos S, LCAS

## 2024-05-23 ENCOUNTER — Ambulatory Visit (HOSPITAL_COMMUNITY): Admitting: Licensed Clinical Social Worker

## 2024-05-24 ENCOUNTER — Ambulatory Visit (HOSPITAL_COMMUNITY): Admitting: Licensed Clinical Social Worker

## 2024-05-30 ENCOUNTER — Ambulatory Visit (HOSPITAL_COMMUNITY): Admitting: Licensed Clinical Social Worker

## 2024-05-31 ENCOUNTER — Ambulatory Visit (HOSPITAL_COMMUNITY): Admitting: Licensed Clinical Social Worker

## 2024-05-31 DIAGNOSIS — F4310 Post-traumatic stress disorder, unspecified: Secondary | ICD-10-CM

## 2024-05-31 DIAGNOSIS — F331 Major depressive disorder, recurrent, moderate: Secondary | ICD-10-CM

## 2024-06-05 ENCOUNTER — Encounter (HOSPITAL_COMMUNITY): Payer: Self-pay | Admitting: Licensed Clinical Social Worker

## 2024-06-05 NOTE — Addendum Note (Signed)
 Addended by: Dezra Mandella S on: 06/05/2024 06:21 PM   Modules accepted: Level of Service

## 2024-06-05 NOTE — Progress Notes (Signed)
 In person GroupVisit Note   I connected with Susan Guerra on 05/31/24 at 5:00-6:30 pm during in person session.   Location: Patient: office Provider: office      History of Present Illness: Pt is referred to therapy for PTSD (MST) and MDD by the [facility]. Her biggest stressors are her health, taking care of her children (15 & 6) transitioning out of her 12 year relationship. Pt has fibromyalgia and other health issues, which affects her ability to take care of all her family's needs.  She has previous therapy.  Treatment Goals Addressed: Pt will recall the traumatic event without becoming too overwhelmed with emotions 50% of the time, evidenced by self report. Meet with clinician in person weekly for therapy to monitor for progress towards goals and address any barriers to success; Reduce depression from average severity level of 6/10 down to a 4/10 in next 90 days by engaging in 1-2 positive coping skills daily as part of developing self-care routine; Reduce average anxiety level from 7/10 down to 5/10 in next 90 days by utilizing 1-2 relaxation skills/grounding skills per day, such as mindful breathing, progressive muscle relaxation, positive visualizations.  Progression towards Goal: Progressing     Observations/Objective:    Patient participated in a group discussion on anxiety. Today the group was in person. Some members of the group had not been seen in person in over 5 years and had frustration and anxiety about coming into the office. Cln normalized fear and frustration which led to anxiety. Cln encouraged pt to continue to monitor levels of anxiety and use effective coping skills.      Collaboration of Care: Other: [facility] see [facility]    Patient/Guardian was advised Release of Information must be obtained prior to any record release in order to collaborate their care with an outside provider.   Patient/Guardian was advised if they have not already done so to  contact the registration department to sign all necessary forms in order for us  to release information regarding their care.   Consent: Patient/Guardian gives verbal consent for treatment and assignment of benefits for services provided during this visit. Patient/Guardian expressed understanding and agreed to proceed.    Assessment and Plan: Counselor will continue to meet with patient to address treatment plan goals. Patient will continue to follow recommendations of providers and implement skills learned in session and practice coping skills between  Sessions.Diagnosis: depression and ptsd     Follow Up Instructions: I discussed the assessment and treatment plan with the patient. The patient was provided an opportunity to ask questions and all were answered. The patient agreed with the plan and demonstrated an understanding of the plan.   The patient was advised to call back or seek an in-person evaluation if the symptoms worsen or if the condition fails to improve as anticipated.   I provided 90 minutes of face-to-face time during the group encounter.     Susan Guerra S, LCAS

## 2024-06-06 ENCOUNTER — Ambulatory Visit (INDEPENDENT_AMBULATORY_CARE_PROVIDER_SITE_OTHER): Admitting: Licensed Clinical Social Worker

## 2024-06-06 ENCOUNTER — Encounter (HOSPITAL_COMMUNITY): Payer: Self-pay | Admitting: Licensed Clinical Social Worker

## 2024-06-06 DIAGNOSIS — F4310 Post-traumatic stress disorder, unspecified: Secondary | ICD-10-CM | POA: Diagnosis not present

## 2024-06-06 DIAGNOSIS — F331 Major depressive disorder, recurrent, moderate: Secondary | ICD-10-CM

## 2024-06-06 DIAGNOSIS — F32A Depression, unspecified: Secondary | ICD-10-CM

## 2024-06-06 NOTE — Progress Notes (Signed)
 Virtual GroupVisit via video Note   I connected with Susan Guerra on 06/06/24 at 5:00-6:30 pm during group session by video enabled telemedicine application and verified that I am speaking with the correct person using two identifiers.   Location: Patient: home Provider: home office   I discussed the limitations of evaluation and management by telemedicine and the availability of in person appointments. The patient expressed understanding and agreed to proceed.   History of Present Illness: Pt is referred to therapy for PTSD (MST) and MDD by the [facility]. Her biggest stressors are her health, taking care of her children (15 & 6) transitioning out of her 12 year relationship. Pt has fibromyalgia and other health issues, which affects her ability to take care of all her family's needs.  She has previous therapy.  Treatment Goals Addressed: Pt will recall the traumatic event without becoming too overwhelmed with emotions 50% of the time, evidenced by self report. Meet with clinician in person weekly for therapy to monitor for progress towards goals and address any barriers to success; Reduce depression from average severity level of 6/10 down to a 4/10 in next 90 days by engaging in 1-2 positive coping skills daily as part of developing self-care routine; Reduce average anxiety level from 7/10 down to 5/10 in next 90 days by utilizing 1-2 relaxation skills/grounding skills per day, such as mindful breathing, progressive muscle relaxation, positive visualizations.  Progression towards Goal: Progressing     Observations/Objective:     Patient participated in a group discussion on boundaries, the different types of boundaries. Psychoeducation was provided on boundaries. Pt was encouraged to continue to use boundaries.     Collaboration of Care: Other: [facility]    Patient/Guardian was advised Release of Information must be obtained prior to any record release in  order to collaborate their care with an outside provider.   Patient/Guardian was advised if they have not already done so to contact the registration department to sign all necessary forms in order for us  to release information regarding their care.   Consent: Patient/Guardian gives verbal consent for treatment and assignment of benefits for services provided during this visit. Patient/Guardian expressed understanding and agreed to proceed.    Assessment and Plan: Counselor will continue to meet with patient to address treatment plan goals. Patient will continue to follow recommendations of providers and implement skills learned in session and practice coping skills between  Sessions.Diagnosis: depression and ptsd     Follow Up Instructions: I discussed the assessment and treatment plan with the patient. The patient was provided an opportunity to ask questions and all were answered. The patient agreed with the plan and demonstrated an understanding of the plan.   The patient was advised to call back or seek an in-person evaluation if the symptoms worsen or if the condition fails to improve as anticipated.   I provided 90 minutes of non-face-to-face time during the group encounter.     Quinto Tippy S, LCAS

## 2024-06-07 ENCOUNTER — Ambulatory Visit (INDEPENDENT_AMBULATORY_CARE_PROVIDER_SITE_OTHER): Admitting: Licensed Clinical Social Worker

## 2024-06-07 ENCOUNTER — Ambulatory Visit (HOSPITAL_COMMUNITY): Admitting: Licensed Clinical Social Worker

## 2024-06-07 DIAGNOSIS — F331 Major depressive disorder, recurrent, moderate: Secondary | ICD-10-CM

## 2024-06-07 DIAGNOSIS — F4310 Post-traumatic stress disorder, unspecified: Secondary | ICD-10-CM | POA: Diagnosis not present

## 2024-06-09 ENCOUNTER — Encounter (HOSPITAL_COMMUNITY): Payer: Self-pay | Admitting: Licensed Clinical Social Worker

## 2024-06-11 NOTE — Progress Notes (Signed)
 Virtual Visit via video Note   I connected with Susan Guerra on 05/08/2024 at 12-1pm by video enabled telemedicine application and verified that I am speaking with the correct person using two identifiers. Pt returns to therapy after an absence due to her community care authorization.   Location: Patient: home Provider: home office   I discussed the limitations of evaluation and management by telemedicine and the availability of in person appointments. The patient expressed understanding and agreed to proceed.   History of Present Illness: Pt is referred to therapy for PTSD (MST) and MDD by the [facility]. Her biggest stressors are her health, taking care of her children (15 & 6) transitioning out of her 12 year relationship. Pt has fibromyalgia and other health issues, which affects her ability to take care of all her family's needs.  She has previous therapy.  Treatment Goals Addressed: Pt will recall the traumatic event without becoming too overwhelmed with emotions 50% of the time, evidenced by self report. Meet with clinician in person weekly for therapy to monitor for progress towards goals and address any barriers to success; Reduce depression from average severity level of 6/10 down to a 4/10 in next 6 months by engaging in 1-2 positive coping skills daily as part of developing self-care routine; Reduce average anxiety level from 7/10 down to 5/10 in next 6 months by utilizing 1-2 relaxation skills/grounding skills per day, such as mindful breathing, progressive muscle relaxation, positive visualizations.  Progression towards Goal: Progressing      Observations/Objective: Patient presented for today's session on time and was alert, oriented x5, with no evidence or self-report of SI/HI or A/V H. Patient reported ongoing compliance with medication and denied any use of alcohol or illicit substances.  Clinician inquired about patient's current emotional ratings, as well as any  significant changes in thoughts, feelings or behavior since previous session. Patient's emotional ratings: 6/10 for depression, 6/10 for anxiety, 2/10 for anger/irritability, recalling traumatic event without becoming too overwhelmed with emotions 50% of the time. All ratings are evidenced by self report. Cln explored trauma emotional ratings with pt who identified her trauma triggers and coping skills used. Pt provided updates on health, family, x relationship, job, care taking for x family member, children.. We discussed the impact these stressors have had on pt as well as how she coped with them.  Pt has been absent from therapy due to her community care authorization, but it's finally authorized. Pt just returned from a cruise with her family and returns rested and relaxed. Pt reported she and the father of her youngest child are beginning a conversation about resuming their relationship. Cln proved psycho education on communications skills. Cln and pt discussed a CBT workbook for veterans. Cln emailed pt workbook and will begin at next session.   Susan Guerra   Collaboration of Care: Other: Continue with providers at [facility]  Patient/Guardian was advised Release of Information must be obtained prior to any record release in order to collaborate their care with an outside provider.   Patient/Guardian was advised if they have not already done so to contact the registration department to sign all necessary forms in order for us  to release information regarding their care.   Consent: Patient/Guardian gives verbal consent for treatment and assignment of benefits for services provided during this visit. Patient/Guardian expressed understanding and agreed to proceed.   Assessment and Plan: Counselor will continue to meet with patient to address treatment plan goals. Patient will continue to follow recommendations  of providers and implement skills learned in session and practice skills between sessions. Diagnosis: MDD  and PTSD       Follow Up Instructions: I discussed the assessment and treatment plan with the patient. The patient was provided an opportunity to ask questions and all were answered. The patient agreed with the plan and demonstrated an understanding of the plan.   The patient was advised to call back or seek an in-person evaluation if the symptoms worsen or if the condition fails to improve as anticipated.   I provided 60 minutes of non-face-to-face time during this encounter.     Advay Volante S, LCAS

## 2024-06-13 ENCOUNTER — Ambulatory Visit (INDEPENDENT_AMBULATORY_CARE_PROVIDER_SITE_OTHER): Admitting: Licensed Clinical Social Worker

## 2024-06-13 ENCOUNTER — Encounter (HOSPITAL_COMMUNITY): Payer: Self-pay | Admitting: Licensed Clinical Social Worker

## 2024-06-13 DIAGNOSIS — F4310 Post-traumatic stress disorder, unspecified: Secondary | ICD-10-CM | POA: Diagnosis not present

## 2024-06-13 DIAGNOSIS — F331 Major depressive disorder, recurrent, moderate: Secondary | ICD-10-CM | POA: Diagnosis not present

## 2024-06-13 NOTE — Progress Notes (Signed)
 Virtual GroupVisit via video Note   I connected with Susan Guerra on 06/13/24 at 5:00-6:30 pm during group session by video enabled telemedicine application and verified that I am speaking with the correct person using two identifiers.   Location: Patient: home Provider: home office   I discussed the limitations of evaluation and management by telemedicine and the availability of in person appointments. The patient expressed understanding and agreed to proceed.   History of Present Illness: Pt is referred to therapy for PTSD (MST) and MDD by the [facility]. Her biggest stressors are her health, taking care of her children (15 & 6) transitioning out of her 12 year relationship. Pt has fibromyalgia and other health issues, which affects her ability to take care of all her family's needs.  She has previous therapy.  Treatment Goals Addressed: Pt will recall the traumatic event without becoming too overwhelmed with emotions 50% of the time, evidenced by self report. Meet with clinician in person weekly for therapy to monitor for progress towards goals and address any barriers to success; Reduce depression from average severity level of 6/10 down to a 4/10 in next 90 days by engaging in 1-2 positive coping skills daily as part of developing self-care routine; Reduce average anxiety level from 7/10 down to 5/10 in next 90 days by utilizing 1-2 relaxation skills/grounding skills per day, such as mindful breathing, progressive muscle relaxation, positive visualizations.  Progression towards Goal: Progressing     Observations/Objective:     Patient participated in a discussion on boundaries, especially the difficulty of using them with family. Cln educated group on the different types of boundaries: rigid, personal, and permeable. Patient was encouraged to use her personal boundaries, especially with family.     Collaboration of Care: Other: [facility]    Patient/Guardian was advised Release of Information must be obtained prior to any record release in order to collaborate their care with an outside provider.   Patient/Guardian was advised if they have not already done so to contact the registration department to sign all necessary forms in order for us  to release information regarding their care.   Consent: Patient/Guardian gives verbal consent for treatment and assignment of benefits for services provided during this visit. Patient/Guardian expressed understanding and agreed to proceed.    Assessment and Plan: Counselor will continue to meet with patient to address treatment plan goals. Patient will continue to follow recommendations of providers and implement skills learned in session and practice coping skills between  Sessions.Diagnosis: depression and ptsd     Follow Up Instructions: I discussed the assessment and treatment plan with the patient. The patient was provided an opportunity to ask questions and all were answered. The patient agreed with the plan and demonstrated an understanding of the plan.   The patient was advised to call back or seek an in-person evaluation if the symptoms worsen or if the condition fails to improve as anticipated.   I provided 90 minutes of non-face-to-face time during the group encounter.     Demarqus Jocson S, LCAS

## 2024-06-14 ENCOUNTER — Ambulatory Visit (INDEPENDENT_AMBULATORY_CARE_PROVIDER_SITE_OTHER): Admitting: Licensed Clinical Social Worker

## 2024-06-14 ENCOUNTER — Encounter (HOSPITAL_COMMUNITY): Payer: Self-pay | Admitting: Licensed Clinical Social Worker

## 2024-06-14 DIAGNOSIS — F4310 Post-traumatic stress disorder, unspecified: Secondary | ICD-10-CM

## 2024-06-14 DIAGNOSIS — F331 Major depressive disorder, recurrent, moderate: Secondary | ICD-10-CM

## 2024-06-14 NOTE — Progress Notes (Signed)
 Virtual Visit via video Note   I connected with Susan Guerra on 06/14/2024 at 12-1pm by video enabled telemedicine application and verified that I am speaking with the correct person using two identifiers. Pt returns to therapy after an absence due to her community care authorization.   Location: Patient: home Provider: home office   I discussed the limitations of evaluation and management by telemedicine and the availability of in person appointments. The patient expressed understanding and agreed to proceed.   History of Present Illness: Pt is referred to therapy for PTSD (MST) and MDD by the [facility]. Her biggest stressors are her health, taking care of her children (15 & 6) transitioning out of her 12 year relationship. Pt has fibromyalgia and other health issues, which affects her ability to take care of all her family's needs.  She has previous therapy.  Treatment Goals Addressed: Pt will recall the traumatic event without becoming too overwhelmed with emotions 50% of the time, evidenced by self report. Meet with clinician in person weekly for therapy to monitor for progress towards goals and address any barriers to success; Reduce depression from average severity level of 6/10 down to a 4/10 in next 6 months by engaging in 1-2 positive coping skills daily as part of developing self-care routine; Reduce average anxiety level from 7/10 down to 5/10 in next 6 months by utilizing 1-2 relaxation skills/grounding skills per day, such as mindful breathing, progressive muscle relaxation, positive visualizations.  Progression towards Goal: Progressing      Observations/Objective: Patient presented for today's session on time and was alert, oriented x5, with no evidence or self-report of SI/HI or A/V H. Patient reported ongoing compliance with medication and denied any use of alcohol or illicit substances.  Clinician inquired about patient's current emotional ratings, as well as any  significant changes in thoughts, feelings or behavior since previous session. Patient's emotional ratings: 5/10 for depression, 5/10 for anxiety, 2/10 for anger/irritability, recalling traumatic event without becoming too overwhelmed with emotions 50% of the time. All ratings are evidenced by self report. Cln explored trauma emotional ratings with pt who identified her trauma triggers and coping skills used. Pt provided updates on health, family, x relationship, job, care taking for x family member, children.. We discussed the impact these stressors have had on pt as well as how she coped with them. Cln and pt reviewed the CBT workbook and discussed how to move forward with the workbook and discussed how to use the workbook.      Susan Guerra   Collaboration of Care: Other: Continue with providers at [facility]  Patient/Guardian was advised Release of Information must be obtained prior to any record release in order to collaborate their care with an outside provider.   Patient/Guardian was advised if they have not already done so to contact the registration department to sign all necessary forms in order for us  to release information regarding their care.   Consent: Patient/Guardian gives verbal consent for treatment and assignment of benefits for services provided during this visit. Patient/Guardian expressed understanding and agreed to proceed.   Assessment and Plan: Counselor will continue to meet with patient to address treatment plan goals. Patient will continue to follow recommendations of providers and implement skills learned in session and practice skills between sessions. Diagnosis: MDD and PTSD       Follow Up Instructions: I discussed the assessment and treatment plan with the patient. The patient was provided an opportunity to ask questions and all were answered.  The patient agreed with the plan and demonstrated an understanding of the plan.   The patient was advised to call back or seek an  in-person evaluation if the symptoms worsen or if the condition fails to improve as anticipated.   I provided 60 minutes of non-face-to-face time during this encounter.     Susan Guerra S, LCAS

## 2024-06-20 ENCOUNTER — Ambulatory Visit (HOSPITAL_COMMUNITY): Admitting: Licensed Clinical Social Worker

## 2024-06-20 ENCOUNTER — Encounter (HOSPITAL_COMMUNITY): Payer: Self-pay

## 2024-06-21 ENCOUNTER — Encounter (HOSPITAL_COMMUNITY): Payer: Self-pay | Admitting: Licensed Clinical Social Worker

## 2024-06-21 ENCOUNTER — Ambulatory Visit (INDEPENDENT_AMBULATORY_CARE_PROVIDER_SITE_OTHER): Admitting: Licensed Clinical Social Worker

## 2024-06-21 DIAGNOSIS — F331 Major depressive disorder, recurrent, moderate: Secondary | ICD-10-CM

## 2024-06-21 DIAGNOSIS — F4310 Post-traumatic stress disorder, unspecified: Secondary | ICD-10-CM | POA: Diagnosis not present

## 2024-06-21 NOTE — Progress Notes (Signed)
 Virtual Visit via video Note   I connected with Susan Guerra on 06/21/2024 at 12:15-1pm by video enabled telemedicine application and verified that I am speaking with the correct person using two identifiers. Pt returns to therapy after an absence due to her community care authorization.   Location: Patient: home Provider: home office   I discussed the limitations of evaluation and management by telemedicine and the availability of in person appointments. The patient expressed understanding and agreed to proceed.   History of Present Illness: Pt is referred to therapy for PTSD (MST) and MDD by the [facility]. Her biggest stressors are her health, taking care of her children (15 & 6) transitioning out of her 12 year relationship. Pt has fibromyalgia and other health issues, which affects her ability to take care of all her family's needs.  She has previous therapy.  Treatment Goals Addressed: Pt will recall the traumatic event without becoming too overwhelmed with emotions 50% of the time, evidenced by self report. Meet with clinician in person weekly for therapy to monitor for progress towards goals and address any barriers to success; Reduce depression from average severity level of 6/10 down to a 4/10 in next 6 months by engaging in 1-2 positive coping skills daily as part of developing self-care routine; Reduce average anxiety level from 7/10 down to 5/10 in next 6 months by utilizing 1-2 relaxation skills/grounding skills per day, such as mindful breathing, progressive muscle relaxation, positive visualizations.  Progression towards Goal: Progressing      Observations/Objective: Patient presented for today's session on time and was alert, oriented x5, with no evidence or self-report of SI/HI or A/V H. Patient reported ongoing compliance with medication and denied any use of alcohol or illicit substances.  Clinician inquired about patient's current emotional ratings, as well as any  significant changes in thoughts, feelings or behavior since previous session. Patient's emotional ratings: 6/10 for depression, 5/10 for anxiety, 2/10 for anger/irritability, recalling traumatic event without becoming too overwhelmed with emotions 50% of the time. All ratings are evidenced by self report. Cln explored trauma emotional ratings with pt who identified her trauma triggers and coping skills used. Pt provided updates on health, family, x relationship, job, care taking for x family member, children.. We discussed the impact these stressors have had on pt as well as how she coped with them. Again,cln and pt reviewed the CBT workbook and reviewed the first 2 activities.      Susan Guerra   Collaboration of Care: Other: Continue with providers at [facility]  Patient/Guardian was advised Release of Information must be obtained prior to any record release in order to collaborate their care with an outside provider.   Patient/Guardian was advised if they have not already done so to contact the registration department to sign all necessary forms in order for us  to release information regarding their care.   Consent: Patient/Guardian gives verbal consent for treatment and assignment of benefits for services provided during this visit. Patient/Guardian expressed understanding and agreed to proceed.   Assessment and Plan: Counselor will continue to meet with patient to address treatment plan goals. Patient will continue to follow recommendations of providers and implement skills learned in session and practice skills between sessions. Diagnosis: MDD and PTSD       Follow Up Instructions: I discussed the assessment and treatment plan with the patient. The patient was provided an opportunity to ask questions and all were answered. The patient agreed with the plan and demonstrated an understanding  of the plan.   The patient was advised to call back or seek an in-person evaluation if the symptoms worsen or if the  condition fails to improve as anticipated.   I provided 45 minutes of non-face-to-face time during this encounter.     Susan Guerra S, LCAS

## 2024-06-27 ENCOUNTER — Encounter (HOSPITAL_COMMUNITY): Payer: Self-pay | Admitting: Licensed Clinical Social Worker

## 2024-06-27 ENCOUNTER — Ambulatory Visit (INDEPENDENT_AMBULATORY_CARE_PROVIDER_SITE_OTHER): Admitting: Licensed Clinical Social Worker

## 2024-06-27 DIAGNOSIS — F331 Major depressive disorder, recurrent, moderate: Secondary | ICD-10-CM

## 2024-06-27 DIAGNOSIS — F32A Depression, unspecified: Secondary | ICD-10-CM | POA: Diagnosis not present

## 2024-06-27 DIAGNOSIS — F4310 Post-traumatic stress disorder, unspecified: Secondary | ICD-10-CM

## 2024-06-27 NOTE — Progress Notes (Signed)
 Virtual GroupVisit via video Note   I connected with [name] on 06/27/24 at 5:00-6:30 pm during group session by video enabled telemedicine application and verified that I am speaking with the correct person using two identifiers.   Location: Patient: home Provider: home office   I discussed the limitations of evaluation and management by telemedicine and the availability of in person appointments. The patient expressed understanding and agreed to proceed.   History of Present Illness: Pt is referred to therapy for PTSD (MST) and MDD by the [facility]. Her biggest stressors are her health, taking care of her children (15 & 6) transitioning out of her 12 year relationship. Pt has fibromyalgia and other health issues, which affects her ability to take care of all her family's needs.  She has previous therapy.  Treatment Goals Addressed: Pt will recall the traumatic event without becoming too overwhelmed with emotions 50% of the time, evidenced by self report. Meet with clinician in person weekly for therapy to monitor for progress towards goals and address any barriers to success; Reduce depression from average severity level of 6/10 down to a 4/10 in next 90 days by engaging in 1-2 positive coping skills daily as part of developing self-care routine; Reduce average anxiety level from 7/10 down to 5/10 in next 90 days by utilizing 1-2 relaxation skills/grounding skills per day, such as mindful breathing, progressive muscle relaxation, positive visualizations.  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 Each pt gave congratulations to each other for sharing a common theme, serving and assisting each other in the group.     Collaboration of Care: Other: [facility]    Patient/Guardian was advised Release of  Information must be obtained prior to any record release in order to collaborate their care with an outside provider.   Patient/Guardian was advised if they have not already done so to contact the registration department to sign all necessary forms in order for us  to release information regarding their care.   Consent: Patient/Guardian gives verbal consent for treatment and assignment of benefits for services provided during this visit. Patient/Guardian expressed understanding and agreed to proceed.    Assessment and Plan: Counselor will continue to meet with patient to address treatment plan goals. Patient will continue to follow recommendations of providers and implement skills learned in session and practice coping skills between  Sessions.Diagnosis: depression and ptsd     Follow Up Instructions: I discussed the assessment and treatment plan with the patient. The patient was provided an opportunity to ask questions and all were answered. The patient agreed with the plan and demonstrated an understanding of the plan.   The patient was advised to call back or seek an in-person evaluation if the symptoms worsen or if the condition fails to improve as anticipated.   I provided 90 minutes of non-face-to-face time during the group encounter.     Brodi Nery S, LCAS

## 2024-06-28 ENCOUNTER — Ambulatory Visit (HOSPITAL_COMMUNITY): Admitting: Licensed Clinical Social Worker

## 2024-06-29 ENCOUNTER — Encounter (HOSPITAL_COMMUNITY): Payer: Self-pay | Admitting: Licensed Clinical Social Worker

## 2024-06-29 ENCOUNTER — Ambulatory Visit (HOSPITAL_COMMUNITY): Admitting: Licensed Clinical Social Worker

## 2024-06-29 DIAGNOSIS — F4310 Post-traumatic stress disorder, unspecified: Secondary | ICD-10-CM | POA: Diagnosis not present

## 2024-06-29 DIAGNOSIS — F329 Major depressive disorder, single episode, unspecified: Secondary | ICD-10-CM

## 2024-06-29 DIAGNOSIS — F331 Major depressive disorder, recurrent, moderate: Secondary | ICD-10-CM

## 2024-06-29 NOTE — Progress Notes (Signed)
 Virtual Visit via video Note   I connected with Susan Guerra on 06/29/2024 at 4-5pm by video enabled telemedicine application and verified that I am speaking with the correct person using two identifiers. Pt returns to therapy after an absence due to her community care authorization.   Location: Patient: home Provider: home office   I discussed the limitations of evaluation and management by telemedicine and the availability of in person appointments. The patient expressed understanding and agreed to proceed.   History of Present Illness: Pt is referred to therapy for PTSD (MST) and MDD by the [facility]. Her biggest stressors are her health, taking care of her children (15 & 6) transitioning out of her 12 year relationship. Pt has fibromyalgia and other health issues, which affects her ability to take care of all her family's needs.  She has previous therapy.  Treatment Goals Addressed: Pt will recall the traumatic event without becoming too overwhelmed with emotions 50% of the time, evidenced by self report. Meet with clinician in person weekly for therapy to monitor for progress towards goals and address any barriers to success; Reduce depression from average severity level of 6/10 down to a 4/10 in next 6 months by engaging in 1-2 positive coping skills daily as part of developing self-care routine; Reduce average anxiety level from 7/10 down to 5/10 in next 6 months by utilizing 1-2 relaxation skills/grounding skills per day, such as mindful breathing, progressive muscle relaxation, positive visualizations.  Progression towards Goal: Progressing      Observations/Objective: Patient presented for today's session on time and was alert, oriented x5, with no evidence or self-report of SI/HI or A/V H. Patient reported ongoing compliance with medication and denied any use of alcohol or illicit substances.  Clinician inquired about patient's current emotional ratings, as well as any  significant changes in thoughts, feelings or behavior since previous session. Patient's emotional ratings: 6/10 for depression, 6/10 for anxiety, 2/10 for anger/irritability, recalling traumatic event without becoming too overwhelmed with emotions 50% of the time. All ratings are evidenced by self report. Cln explored trauma emotional ratings with pt who identified her trauma triggers and coping skills used. Pt provided updates on health, family, x relationship, job, care taking for x family member, children.. We discussed the impact these stressors have had on pt as well as how she coped with them. Cln and pt continue working with the CBT workbook and reviewed, continuing with CBT activities. Pt's daughter is experiencing LFT. Cln provided psychoeducation on LFT and emailed pt DBT skills.     Susan Guerra   Collaboration of Care: Other: Continue with providers at [facility]  Patient/Guardian was advised Release of Information must be obtained prior to any record release in order to collaborate their care with an outside provider.   Patient/Guardian was advised if they have not already done so to contact the registration department to sign all necessary forms in order for us  to release information regarding their care.   Consent: Patient/Guardian gives verbal consent for treatment and assignment of benefits for services provided during this visit. Patient/Guardian expressed understanding and agreed to proceed.   Assessment and Plan: Counselor will continue to meet with patient to address treatment plan goals. Patient will continue to follow recommendations of providers and implement skills learned in session and practice skills between sessions. Diagnosis: MDD and PTSD       Follow Up Instructions: I discussed the assessment and treatment plan with the patient. The patient was provided an opportunity to  ask questions and all were answered. The patient agreed with the plan and demonstrated an understanding of the  plan.   The patient was advised to call back or seek an in-person evaluation if the symptoms worsen or if the condition fails to improve as anticipated.   I provided 60 minutes of non-face-to-face time during this encounter.     Hardie Veltre S, LCAS

## 2024-07-04 ENCOUNTER — Ambulatory Visit (INDEPENDENT_AMBULATORY_CARE_PROVIDER_SITE_OTHER): Admitting: Licensed Clinical Social Worker

## 2024-07-04 ENCOUNTER — Encounter (HOSPITAL_COMMUNITY): Payer: Self-pay | Admitting: Licensed Clinical Social Worker

## 2024-07-04 DIAGNOSIS — F4310 Post-traumatic stress disorder, unspecified: Secondary | ICD-10-CM | POA: Diagnosis not present

## 2024-07-04 DIAGNOSIS — F331 Major depressive disorder, recurrent, moderate: Secondary | ICD-10-CM | POA: Diagnosis not present

## 2024-07-04 NOTE — Progress Notes (Signed)
 Virtual GroupVisit via video Note   I connected with Susan Guerra on 07/04/24 at 5:00-6:30 pm during group session by video enabled telemedicine application and verified that I am speaking with the correct person using two identifiers.   Location: Patient: home Provider: home office   I discussed the limitations of evaluation and management by telemedicine and the availability of in person appointments. The patient expressed understanding and agreed to proceed.   History of Present Illness: Pt is referred to therapy for PTSD (MST) and MDD by the [facility]. Her biggest stressors are her health, taking care of her children (15 & 6) transitioning out of her 12 year relationship. Pt has fibromyalgia and other health issues, which affects her ability to take care of all her family's needs.  She has previous therapy.  Treatment Goals Addressed: Pt will recall the traumatic event without becoming too overwhelmed with emotions 50% of the time, evidenced by self report. Meet with clinician in person weekly for therapy to monitor for progress towards goals and address any barriers to success; Reduce depression from average severity level of 6/10 down to a 4/10 in next 90 days by engaging in 1-2 positive coping skills daily as part of developing self-care routine; Reduce average anxiety level from 7/10 down to 5/10 in next 90 days by utilizing 1-2 relaxation skills/grounding skills per day, such as mindful breathing, progressive muscle relaxation, positive visualizations.  Progression towards Goal: Progressing     Observations/Objective:     Patient participated in a discussion on overcoming life's challenges, under stress, which makes it more difficult to overcome obstacles in life. Patient was encouraged to identify life challenges and explore strategies for overcoming obstacles.         Collaboration of Care: Other: [facility]    Patient/Guardian was advised Release  of Information must be obtained prior to any record release in order to collaborate their care with an outside provider.   Patient/Guardian was advised if they have not already done so to contact the registration department to sign all necessary forms in order for us  to release information regarding their care.   Consent: Patient/Guardian gives verbal consent for treatment and assignment of benefits for services provided during this visit. Patient/Guardian expressed understanding and agreed to proceed.    Assessment and Plan: Counselor will continue to meet with patient to address treatment plan goals. Patient will continue to follow recommendations of providers and implement skills learned in session and practice coping skills between  Sessions.Diagnosis: depression and ptsd     Follow Up Instructions: I discussed the assessment and treatment plan with the patient. The patient was provided an opportunity to ask questions and all were answered. The patient agreed with the plan and demonstrated an understanding of the plan.   The patient was advised to call back or seek an in-person evaluation if the symptoms worsen or if the condition fails to improve as anticipated.   I provided 90 minutes of non-face-to-face time during the group encounter.     Susan Guerra S, LCAS

## 2024-07-05 ENCOUNTER — Encounter (HOSPITAL_COMMUNITY): Payer: Self-pay | Admitting: Licensed Clinical Social Worker

## 2024-07-05 ENCOUNTER — Ambulatory Visit (INDEPENDENT_AMBULATORY_CARE_PROVIDER_SITE_OTHER): Admitting: Licensed Clinical Social Worker

## 2024-07-05 DIAGNOSIS — F4310 Post-traumatic stress disorder, unspecified: Secondary | ICD-10-CM

## 2024-07-05 DIAGNOSIS — F331 Major depressive disorder, recurrent, moderate: Secondary | ICD-10-CM

## 2024-07-05 NOTE — Progress Notes (Signed)
 Virtual Visit via video Note   I connected with Susan Guerra on 07/05/2024 at 12:15-1pm by video enabled telemedicine application and verified that I am speaking with the correct person using two identifiers. Pt returns to therapy after an absence due to her community care authorization.   Location: Patient: home Provider: home office   I discussed the limitations of evaluation and management by telemedicine and the availability of in person appointments. The patient expressed understanding and agreed to proceed.   History of Present Illness: Pt is referred to therapy for PTSD (MST) and MDD by the [facility]. Her biggest stressors are her health, taking care of her children (15 & 6) transitioning out of her 12 year relationship. Pt has fibromyalgia and other health issues, which affects her ability to take care of all her family's needs.  She has previous therapy.  Treatment Goals Addressed: Pt will recall the traumatic event without becoming too overwhelmed with emotions 50% of the time, evidenced by self report. Meet with clinician in person weekly for therapy to monitor for progress towards goals and address any barriers to success; Reduce depression from average severity level of 6/10 down to a 4/10 in next 6 months by engaging in 1-2 positive coping skills daily as part of developing self-care routine; Reduce average anxiety level from 7/10 down to 5/10 in next 6 months by utilizing 1-2 relaxation skills/grounding skills per day, such as mindful breathing, progressive muscle relaxation, positive visualizations.  Progression towards Goal: Progressing      Observations/Objective: Patient presented for today's session on time and was alert, oriented x5, with no evidence or self-report of SI/HI or A/V H. Patient reported ongoing compliance with medication and denied any use of alcohol or illicit substances.  Clinician inquired about patient's current emotional ratings, as well as any  significant changes in thoughts, feelings or behavior since previous session. Patient's emotional ratings: 6/10 for depression, 6/10 for anxiety, 2/10 for anger/irritability, recalling traumatic event without becoming too overwhelmed with emotions 50% of the time. All ratings are evidenced by self report. Cln explored trauma emotional ratings with pt who identified her trauma triggers and coping skills used. Pt provided updates on health, family, x relationship, job, care taking for x family member, children.. We discussed the impact these stressors have had on pt as well as how she coped with them. Cln and pt continue working with the CBT workbook and reviewed, continuing with CBT activities.:Improving well being.        [name]   Collaboration of Care: Other: Continue with providers at [facility]  Patient/Guardian was advised Release of Information must be obtained prior to any record release in order to collaborate their care with an outside provider.   Patient/Guardian was advised if they have not already done so to contact the registration department to sign all necessary forms in order for us  to release information regarding their care.   Consent: Patient/Guardian gives verbal consent for treatment and assignment of benefits for services provided during this visit. Patient/Guardian expressed understanding and agreed to proceed.   Assessment and Plan: Counselor will continue to meet with patient to address treatment plan goals. Patient will continue to follow recommendations of providers and implement skills learned in session and practice skills between sessions. Diagnosis: MDD and PTSD       Follow Up Instructions: I discussed the assessment and treatment plan with the patient. The patient was provided an opportunity to ask questions and all were answered. The patient agreed with  the plan and demonstrated an understanding of the plan.   The patient was advised to call back or seek an in-person  evaluation if the symptoms worsen or if the condition fails to improve as anticipated.   I provided 45 minutes of non-face-to-face time during this encounter.     Warren Kugelman S, LCAS

## 2024-07-11 ENCOUNTER — Ambulatory Visit (INDEPENDENT_AMBULATORY_CARE_PROVIDER_SITE_OTHER): Admitting: Licensed Clinical Social Worker

## 2024-07-11 ENCOUNTER — Encounter (HOSPITAL_COMMUNITY): Payer: Self-pay | Admitting: Licensed Clinical Social Worker

## 2024-07-11 DIAGNOSIS — F4310 Post-traumatic stress disorder, unspecified: Secondary | ICD-10-CM | POA: Diagnosis not present

## 2024-07-11 DIAGNOSIS — F331 Major depressive disorder, recurrent, moderate: Secondary | ICD-10-CM

## 2024-07-11 DIAGNOSIS — F329 Major depressive disorder, single episode, unspecified: Secondary | ICD-10-CM | POA: Diagnosis not present

## 2024-07-11 NOTE — Progress Notes (Signed)
 Virtual GroupVisit via video Note   I connected with Susan Guerra on 07/11/24 at 5:00-6:30 pm during group session by video enabled telemedicine application and verified that I am speaking with the correct person using two identifiers.   Location: Patient: home Provider: home office   I discussed the limitations of evaluation and management by telemedicine and the availability of in person appointments. The patient expressed understanding and agreed to proceed.   History of Present Illness: Pt is referred to therapy for PTSD (MST) and MDD by the [facility]. Her biggest stressors are her health, taking care of her children (15 & 6) transitioning out of her 12 year relationship. Pt has fibromyalgia and other health issues, which affects her ability to take care of all her family's needs.  She has previous therapy.  Treatment Goals Addressed: Pt will recall the traumatic event without becoming too overwhelmed with emotions 50% of the time, evidenced by self report. Meet with clinician in person weekly for therapy to monitor for progress towards goals and address any barriers to success; Reduce depression from average severity level of 6/10 down to a 4/10 in next 90 days by engaging in 1-2 positive coping skills daily as part of developing self-care routine; Reduce average anxiety level from 7/10 down to 5/10 in next 90 days by utilizing 1-2 relaxation skills/grounding skills per day, such as mindful breathing, progressive muscle relaxation, positive visualizations.  Progression towards Goal: Progressing     Observations/Objective:    Patient participated in a group discussion on "tolerating the holidays" with family members. Patient described his holiday plans and coping skills to deal with feelings surrounding the holidays. Patient was encouraged to express her feelings in positive ways.       Collaboration of Care: Other: [facility]    Patient/Guardian was  advised Release of Information must be obtained prior to any record release in order to collaborate their care with an outside provider.   Patient/Guardian was advised if they have not already done so to contact the registration department to sign all necessary forms in order for us  to release information regarding their care.   Consent: Patient/Guardian gives verbal consent for treatment and assignment of benefits for services provided during this visit. Patient/Guardian expressed understanding and agreed to proceed.    Assessment and Plan: Counselor will continue to meet with patient to address treatment plan goals. Patient will continue to follow recommendations of providers and implement skills learned in session and practice coping skills between  Sessions.Diagnosis: depression and ptsd     Follow Up Instructions: I discussed the assessment and treatment plan with the patient. The patient was provided an opportunity to ask questions and all were answered. The patient agreed with the plan and demonstrated an understanding of the plan.   The patient was advised to call back or seek an in-person evaluation if the symptoms worsen or if the condition fails to improve as anticipated.   I provided 90 minutes of non-face-to-face time during the group encounter.     Susan Guerra S, LCAS

## 2024-07-12 ENCOUNTER — Ambulatory Visit (HOSPITAL_COMMUNITY): Admitting: Licensed Clinical Social Worker

## 2024-07-12 DIAGNOSIS — F331 Major depressive disorder, recurrent, moderate: Secondary | ICD-10-CM | POA: Diagnosis not present

## 2024-07-12 DIAGNOSIS — F4310 Post-traumatic stress disorder, unspecified: Secondary | ICD-10-CM | POA: Diagnosis not present

## 2024-07-13 ENCOUNTER — Encounter (HOSPITAL_COMMUNITY): Payer: Self-pay | Admitting: Licensed Clinical Social Worker

## 2024-07-13 NOTE — Progress Notes (Signed)
 Virtual Visit via video Note   I connected with Susan Guerra on 07/12/2024 at 12:15-1pm by video enabled telemedicine application and verified that I am speaking with the correct person using two identifiers. Pt returns to therapy after an absence due to her community care authorization.   Location: Patient: home Provider: home office   I discussed the limitations of evaluation and management by telemedicine and the availability of in person appointments. The patient expressed understanding and agreed to proceed.   History of Present Illness: Pt is referred to therapy for PTSD (MST) and MDD by the [facility]. Her biggest stressors are her health, taking care of her children (15 & 6) transitioning out of her 12 year relationship. Pt has fibromyalgia and other health issues, which affects her ability to take care of all her family's needs.  She has previous therapy.  Treatment Goals Addressed: Pt will recall the traumatic event without becoming too overwhelmed with emotions 50% of the time, evidenced by self report. Meet with clinician in person weekly for therapy to monitor for progress towards goals and address any barriers to success; Reduce depression from average severity level of 6/10 down to a 4/10 in next 6 months by engaging in 1-2 positive coping skills daily as part of developing self-care routine; Reduce average anxiety level from 7/10 down to 5/10 in next 6 months by utilizing 1-2 relaxation skills/grounding skills per day, such as mindful breathing, progressive muscle relaxation, positive visualizations.  Progression towards Goal: Progressing      Observations/Objective: Patient presented for today's session on time and was alert, oriented x5, with no evidence or self-report of SI/HI or A/V H. Patient reported ongoing compliance with medication and denied any use of alcohol or illicit substances.  Clinician inquired about patient's current emotional ratings, as well as any  significant changes in thoughts, feelings or behavior since previous session. Patient's emotional ratings: 6/10 for depression, 6/10 for anxiety, 2/10 for anger/irritability, recalling traumatic event without becoming too overwhelmed with emotions 50% of the time. All ratings are evidenced by self report. Cln explored trauma emotional ratings with pt who identified her trauma triggers and coping skills used. Pt provided updates on health, family, x relationship, job, care taking for x family member, children.. We discussed the impact these stressors have had on pt as well as how she coped with them. Cln and pt continue towork with the CBT workbook and again,reviewed, continuing with CBT activities.:Improving well being.  Pt described her thanksgiving plans and what she is grateful for.       Susan Guerra   Collaboration of Care: Other: Continue with providers at [facility]  Patient/Guardian was advised Release of Information must be obtained prior to any record release in order to collaborate their care with an outside provider.   Patient/Guardian was advised if they have not already done so to contact the registration department to sign all necessary forms in order for us  to release information regarding their care.   Consent: Patient/Guardian gives verbal consent for treatment and assignment of benefits for services provided during this visit. Patient/Guardian expressed understanding and agreed to proceed.   Assessment and Plan: Counselor will continue to meet with patient to address treatment plan goals. Patient will continue to follow recommendations of providers and implement skills learned in session and practice skills between sessions. Diagnosis: MDD and PTSD       Follow Up Instructions: I discussed the assessment and treatment plan with the patient. The patient was provided an opportunity  to ask questions and all were answered. The patient agreed with the plan and demonstrated an understanding of the  plan.   The patient was advised to call back or seek an in-person evaluation if the symptoms worsen or if the condition fails to improve as anticipated.   I provided 45 minutes of non-face-to-face time during this encounter.     Mandi Mattioli S, LCAS

## 2024-07-18 ENCOUNTER — Encounter (HOSPITAL_COMMUNITY): Payer: Self-pay | Admitting: Licensed Clinical Social Worker

## 2024-07-18 ENCOUNTER — Ambulatory Visit (INDEPENDENT_AMBULATORY_CARE_PROVIDER_SITE_OTHER): Admitting: Licensed Clinical Social Worker

## 2024-07-18 DIAGNOSIS — F331 Major depressive disorder, recurrent, moderate: Secondary | ICD-10-CM

## 2024-07-18 DIAGNOSIS — F4310 Post-traumatic stress disorder, unspecified: Secondary | ICD-10-CM

## 2024-07-18 NOTE — Progress Notes (Signed)
 Virtual GroupVisit via video Note   I connected with Susan Guerra on 12/1//25 at 5:00-6:30 pm during group session by video enabled telemedicine application and verified that I am speaking with the correct person using two identifiers.   Location: Patient: home Provider: home office   I discussed the limitations of evaluation and management by telemedicine and the availability of in person appointments. The patient expressed understanding and agreed to proceed.   History of Present Illness: Pt is referred to therapy for PTSD (MST) and MDD by the [facility]. Her biggest stressors are her health, taking care of her children (15 & 6) transitioning out of her 12 year relationship. Pt has fibromyalgia and other health issues, which affects her ability to take care of all her family's needs.  She has previous therapy.  Treatment Goals Addressed: Pt will recall the traumatic event without becoming too overwhelmed with emotions 50% of the time, evidenced by self report. Meet with clinician in person weekly for therapy to monitor for progress towards goals and address any barriers to success; Reduce depression from average severity level of 6/10 down to a 4/10 in next 90 days by engaging in 1-2 positive coping skills daily as part of developing self-care routine; Reduce average anxiety level from 7/10 down to 5/10 in next 90 days by utilizing 1-2 relaxation skills/grounding skills per day, such as mindful breathing, progressive muscle relaxation, positive visualizations.  Progression towards Goal: Progressing     Observations/Objective:    Patient participated in a group discussion on the holidays with family. Patient described her holidays and coping skills used to deal with feelings surrounding the holidays. Clinician congratulated patient on coping skills used and tolerating the holidays with family.      Collaboration of [facility]: Other[facility]    Patient/Guardian  was advised Release of Information must be obtained prior to any record release in order to collaborate their care with an outside provider.   Patient/Guardian was advised if they have not already done so to contact the registration department to sign all necessary forms in order for us  to release information regarding their care.   Consent: Patient/Guardian gives verbal consent for treatment and assignment of benefits for services provided during this visit. Patient/Guardian expressed understanding and agreed to proceed.    Assessment and Plan: Counselor will continue to meet with patient to address treatment plan goals. Patient will continue to follow recommendations of providers and implement skills learned in session and practice coping skills between  Sessions.Diagnosis: depression and ptsd     Follow Up Instructions: I discussed the assessment and treatment plan with the patient. The patient was provided an opportunity to ask questions and all were answered. The patient agreed with the plan and demonstrated an understanding of the plan.   The patient was advised to call back or seek an in-person evaluation if the symptoms worsen or if the condition fails to improve as anticipated.   I provided 90 minutes of non-face-to-face time during the group encounter.     Courtenay Hirth S, LCAS

## 2024-07-19 ENCOUNTER — Ambulatory Visit (INDEPENDENT_AMBULATORY_CARE_PROVIDER_SITE_OTHER): Admitting: Licensed Clinical Social Worker

## 2024-07-19 DIAGNOSIS — F4310 Post-traumatic stress disorder, unspecified: Secondary | ICD-10-CM

## 2024-07-19 DIAGNOSIS — F329 Major depressive disorder, single episode, unspecified: Secondary | ICD-10-CM | POA: Diagnosis not present

## 2024-07-19 DIAGNOSIS — F331 Major depressive disorder, recurrent, moderate: Secondary | ICD-10-CM

## 2024-07-24 ENCOUNTER — Encounter (HOSPITAL_COMMUNITY): Payer: Self-pay | Admitting: Licensed Clinical Social Worker

## 2024-07-24 NOTE — Progress Notes (Signed)
 Virtual Visit via video Note   I connected with Susan Guerra on 07/19/2024 at 12:15-1pm by video enabled telemedicine application and verified that I am speaking with the correct person using two identifiers. Pt returns to therapy after an absence due to her community care authorization.   Location: Patient: home Provider: home office   I discussed the limitations of evaluation and management by telemedicine and the availability of in person appointments. The patient expressed understanding and agreed to proceed.   History of Present Illness: Pt is referred to therapy for PTSD (MST) and MDD by the [facility]. Her biggest stressors are her health, taking care of her children (15 & 6) transitioning out of her 12 year relationship. Pt has fibromyalgia and other health issues, which affects her ability to take care of all her family's needs.  She has previous therapy.  Treatment Goals Addressed: Pt will recall the traumatic event without becoming too overwhelmed with emotions 50% of the time, evidenced by self report. Meet with clinician in person weekly for therapy to monitor for progress towards goals and address any barriers to success; Reduce depression from average severity level of 6/10 down to a 4/10 in next 6 months by engaging in 1-2 positive coping skills daily as part of developing self-care routine; Reduce average anxiety level from 7/10 down to 5/10 in next 6 months by utilizing 1-2 relaxation skills/grounding skills per day, such as mindful breathing, progressive muscle relaxation, positive visualizations.  Progression towards Goal: Progressing      Observations/Objective: Patient presented for today's session on time and was alert, oriented x5, with no evidence or self-report of SI/HI or A/V H. Patient reported ongoing compliance with medication and denied any use of alcohol or illicit substances.  Clinician inquired about patient's current emotional ratings, as well as any  significant changes in thoughts, feelings or behavior since previous session. Patient's emotional ratings: 6/10 for depression, 6/10 for anxiety, 1/10 for anger/irritability, recalling traumatic event without becoming too overwhelmed with emotions 40% of the time. All ratings are evidenced by self report. Cln explored trauma emotional ratings with pt who identified her trauma triggers and coping skills used. Pt provided updates on health, family, x relationship, job,, 3children.. We discussed the impact these stressors have had on pt as well as how she coped with them. Cln and pt continue towork with the CBT workbook and again,reviewed, continuing with CBT activities and went over homework, pt asking appropriate questions.  Pt described her thanksgiving with her family. Which provided her with much needed rest and relaxation..       .   Collaboration of Care: Other: Continue with providers at [facility]  Patient/Guardian was advised Release of Information must be obtained prior to any record release in order to collaborate their care with an outside provider.   Patient/Guardian was advised if they have not already done so to contact the registration department to sign all necessary forms in order for us  to release information regarding their care.   Consent: Patient/Guardian gives verbal consent for treatment and assignment of benefits for services provided during this visit. Patient/Guardian expressed understanding and agreed to proceed.   Assessment and Plan: Counselor will continue to meet with patient to address treatment plan goals. Patient will continue to follow recommendations of providers and implement skills learned in session and practice skills between sessions. Diagnosis: MDD and PTSD       Follow Up Instructions: I discussed the assessment and treatment plan with the patient. The  patient was provided an opportunity to ask questions and all were answered. The patient agreed with the plan  and demonstrated an understanding of the plan.   The patient was advised to call back or seek an in-person evaluation if the symptoms worsen or if the condition fails to improve as anticipated.   I provided 45 minutes of non-face-to-face time during this encounter.     Kashia Brossard S, LCAS

## 2024-07-25 ENCOUNTER — Encounter (HOSPITAL_COMMUNITY): Payer: Self-pay | Admitting: Licensed Clinical Social Worker

## 2024-07-25 ENCOUNTER — Ambulatory Visit (INDEPENDENT_AMBULATORY_CARE_PROVIDER_SITE_OTHER): Admitting: Licensed Clinical Social Worker

## 2024-07-25 DIAGNOSIS — F32A Depression, unspecified: Secondary | ICD-10-CM | POA: Diagnosis not present

## 2024-07-25 DIAGNOSIS — F331 Major depressive disorder, recurrent, moderate: Secondary | ICD-10-CM

## 2024-07-25 DIAGNOSIS — F4310 Post-traumatic stress disorder, unspecified: Secondary | ICD-10-CM

## 2024-07-25 NOTE — Progress Notes (Signed)
 Virtual GroupVisit via video Note   I connected with [name] on 12/8//25 at 5:00-6:30 pm during group session by video enabled telemedicine application and verified that I am speaking with the correct person using two identifiers.   Location: Patient: home Provider: home office   I discussed the limitations of evaluation and management by telemedicine and the availability of in person appointments. The patient expressed understanding and agreed to proceed.   History of Present Illness: Pt is referred to therapy for PTSD (MST) and MDD by the [facility]. Her biggest stressors are her health, taking care of her children (15 & 6) transitioning out of her 12 year relationship. Pt has fibromyalgia and other health issues, which affects her ability to take care of all her family's needs.  She has previous therapy.  Treatment Goals Addressed: Pt will recall the traumatic event without becoming too overwhelmed with emotions 50% of the time, evidenced by self report. Meet with clinician in person weekly for therapy to monitor for progress towards goals and address any barriers to success; Reduce depression from average severity level of 6/10 down to a 4/10 in next 90 days by engaging in 1-2 positive coping skills daily as part of developing self-care routine; Reduce average anxiety level from 7/10 down to 5/10 in next 90 days by utilizing 1-2 relaxation skills/grounding skills per day, such as mindful breathing, progressive muscle relaxation, positive visualizations.  Progression towards Goal: Progressing     Observations/Objective:    Pt participated in a discussion on current source of stressors, especially during the holidays. Pt identified her current stressor: Family,Work, which led the discussion of effective coping skills.  Pt was encouraged to continue using current coping techniques.        Collaboration of Care: Other: [facility]    Patient/Guardian was  advised Release of Information must be obtained prior to any record release in order to collaborate their care with an outside provider.   Patient/Guardian was advised if they have not already done so to contact the registration department to sign all necessary forms in order for us  to release information regarding their care.   Consent: Patient/Guardian gives verbal consent for treatment and assignment of benefits for services provided during this visit. Patient/Guardian expressed understanding and agreed to proceed.    Assessment and Plan: Counselor will continue to meet with patient to address treatment plan goals. Patient will continue to follow recommendations of providers and implement skills learned in session and practice coping skills between  Sessions.Diagnosis: depression and ptsd     Follow Up Instructions: I discussed the assessment and treatment plan with the patient. The patient was provided an opportunity to ask questions and all were answered. The patient agreed with the plan and demonstrated an understanding of the plan.   The patient was advised to call back or seek an in-person evaluation if the symptoms worsen or if the condition fails to improve as anticipated.   I provided 90 minutes of non-face-to-face time during the group encounter.     Shiloh Southern S, LCAS

## 2024-07-26 ENCOUNTER — Ambulatory Visit (INDEPENDENT_AMBULATORY_CARE_PROVIDER_SITE_OTHER): Admitting: Licensed Clinical Social Worker

## 2024-07-26 ENCOUNTER — Encounter (HOSPITAL_COMMUNITY): Payer: Self-pay | Admitting: Licensed Clinical Social Worker

## 2024-07-26 DIAGNOSIS — F329 Major depressive disorder, single episode, unspecified: Secondary | ICD-10-CM | POA: Diagnosis not present

## 2024-07-26 DIAGNOSIS — F331 Major depressive disorder, recurrent, moderate: Secondary | ICD-10-CM

## 2024-07-26 DIAGNOSIS — F4310 Post-traumatic stress disorder, unspecified: Secondary | ICD-10-CM | POA: Diagnosis not present

## 2024-07-26 NOTE — Progress Notes (Signed)
 Virtual Visit via video Note   I connected with Susan Guerra on 07/26/2024 at 12:15-1pm by video enabled telemedicine application and verified that I am speaking with the correct person using two identifiers. Pt returns to therapy after an absence due to her community care authorization.   Location: Patient: home Provider: home office   I discussed the limitations of evaluation and management by telemedicine and the availability of in person appointments. The patient expressed understanding and agreed to proceed.   History of Present Illness: Pt is referred to therapy for PTSD (MST) and MDD by the [facility]. Her biggest stressors are her health, taking care of her children (15 & 6) transitioning out of her 12 year relationship. Pt has fibromyalgia and other health issues, which affects her ability to take care of all her family's needs.  She has previous therapy.  Treatment Goals Addressed: Pt will recall the traumatic event without becoming too overwhelmed with emotions 50% of the time, evidenced by self report. Meet with clinician in person weekly for therapy to monitor for progress towards goals and address any barriers to success; Reduce depression from average severity level of 6/10 down to a 4/10 in next 6 months by engaging in 1-2 positive coping skills daily as part of developing self-care routine; Reduce average anxiety level from 7/10 down to 5/10 in next 6 months by utilizing 1-2 relaxation skills/grounding skills per day, such as mindful breathing, progressive muscle relaxation, positive visualizations.  Progression towards Goal: Progressing      Observations/Objective: Patient presented for today's session in pain from a fibromyalgia flareup on time and was alert, oriented x5, with no evidence or self-report of SI/HI or A/V H. Patient reported ongoing compliance with medication and denied any use of alcohol or illicit substances.  Clinician inquired about patient's current  emotional ratings, as well as any significant changes in thoughts, feelings or behavior since previous session. Patient's emotional ratings: 6/10 for depression, 6/10 for anxiety, 1/10 for anger/irritability, recalling traumatic event without becoming too overwhelmed with emotions 40% of the time. All ratings are evidenced by self report. Cln explored trauma emotional ratings with pt who identified her trauma triggers and coping skills used. Pt provided updates on health, family, x relationship, job,, 3 children.. We discussed the impact these stressors have had on pt as well as how she coped with them. Cln and pt continue to work with the CBT workbook and again,reviewed, continuing with CBT activities and went over homework, pt asking appropriate questions.. Pt discussed her current flare up and how it affects her ADLs..       .   Collaboration of Care: Other: Continue with providers at [facility]  Patient/Guardian was advised Release of Information must be obtained prior to any record release in order to collaborate their care with an outside provider.   Patient/Guardian was advised if they have not already done so to contact the registration department to sign all necessary forms in order for us  to release information regarding their care.   Consent: Patient/Guardian gives verbal consent for treatment and assignment of benefits for services provided during this visit. Patient/Guardian expressed understanding and agreed to proceed.   Assessment and Plan: Counselor will continue to meet with patient to address treatment plan goals. Patient will continue to follow recommendations of providers and implement skills learned in session and practice skills between sessions. Diagnosis: MDD and PTSD       Follow Up Instructions: I discussed the assessment and treatment plan with  the patient. The patient was provided an opportunity to ask questions and all were answered. The patient agreed with the plan and  demonstrated an understanding of the plan.   The patient was advised to call back or seek an in-person evaluation if the symptoms worsen or if the condition fails to improve as anticipated.   I provided 45 minutes of non-face-to-face time during this encounter.     Joshau Code S, LCAS

## 2024-08-01 ENCOUNTER — Ambulatory Visit (INDEPENDENT_AMBULATORY_CARE_PROVIDER_SITE_OTHER): Admitting: Licensed Clinical Social Worker

## 2024-08-01 ENCOUNTER — Encounter (HOSPITAL_COMMUNITY): Payer: Self-pay | Admitting: Licensed Clinical Social Worker

## 2024-08-01 DIAGNOSIS — F4310 Post-traumatic stress disorder, unspecified: Secondary | ICD-10-CM | POA: Diagnosis not present

## 2024-08-01 DIAGNOSIS — F331 Major depressive disorder, recurrent, moderate: Secondary | ICD-10-CM

## 2024-08-01 DIAGNOSIS — F32A Depression, unspecified: Secondary | ICD-10-CM | POA: Diagnosis not present

## 2024-08-01 NOTE — Progress Notes (Signed)
 Virtual GroupVisit via video Note   I connected with Susan Guerra on 12/15//25 at 5:00-6:30 pm during group session by video enabled telemedicine application and verified that I am speaking with the correct person using two identifiers.   Location: Patient: home Provider: home office   I discussed the limitations of evaluation and management by telemedicine and the availability of in person appointments. The patient expressed understanding and agreed to proceed.   History of Present Illness: Pt is referred to therapy for PTSD (MST) and MDD by the [facility]. Her biggest stressors are her health, taking care of her children (15 & 6) transitioning out of her 12 year relationship. Pt has fibromyalgia and other health issues, which affects her ability to take care of all her family's needs.  She has previous therapy.  Treatment Goals Addressed: Pt will recall the traumatic event without becoming too overwhelmed with emotions 50% of the time, evidenced by self report. Meet with clinician in person weekly for therapy to monitor for progress towards goals and address any barriers to success; Reduce depression from average severity level of 6/10 down to a 4/10 in next 90 days by engaging in 1-2 positive coping skills daily as part of developing self-care routine; Reduce average anxiety level from 7/10 down to 5/10 in next 90 days by utilizing 1-2 relaxation skills/grounding skills per day, such as mindful breathing, progressive muscle relaxation, positive visualizations.  Progression towards Goal: Progressing     Observations/Objective:     Patient participated in a group discussion on tolerating the holidays with family members. Patient described his holiday plans and coping skills to deal with feelings surrounding the holidays. Patient was encouraged to express his feelings in positive ways.      Collaboration of Care: Other: [facility]    Patient/Guardian was  advised Release of Information must be obtained prior to any record release in order to collaborate their care with an outside provider.   Patient/Guardian was advised if they have not already done so to contact the registration department to sign all necessary forms in order for us  to release information regarding their care.   Consent: Patient/Guardian gives verbal consent for treatment and assignment of benefits for services provided during this visit. Patient/Guardian expressed understanding and agreed to proceed.    Assessment and Plan: Counselor will continue to meet with patient to address treatment plan goals. Patient will continue to follow recommendations of providers and implement skills learned in session and practice coping skills between  Sessions.Diagnosis: depression and ptsd     Follow Up Instructions: I discussed the assessment and treatment plan with the patient. The patient was provided an opportunity to ask questions and all were answered. The patient agreed with the plan and demonstrated an understanding of the plan.   The patient was advised to call back or seek an in-person evaluation if the symptoms worsen or if the condition fails to improve as anticipated.   I provided 90 minutes of non-face-to-face time during the group encounter.     Barbera Perritt S, LCAS

## 2024-08-02 ENCOUNTER — Ambulatory Visit (INDEPENDENT_AMBULATORY_CARE_PROVIDER_SITE_OTHER): Admitting: Licensed Clinical Social Worker

## 2024-08-02 DIAGNOSIS — F329 Major depressive disorder, single episode, unspecified: Secondary | ICD-10-CM | POA: Diagnosis not present

## 2024-08-02 DIAGNOSIS — F4310 Post-traumatic stress disorder, unspecified: Secondary | ICD-10-CM

## 2024-08-04 ENCOUNTER — Encounter (HOSPITAL_COMMUNITY): Payer: Self-pay | Admitting: Licensed Clinical Social Worker

## 2024-08-04 NOTE — Progress Notes (Signed)
 Virtual Visit via video Note   I connected with Susan Guerra on 08/04/2024 at 12:15-1pm by video enabled telemedicine application and verified that I am speaking with the correct person using two identifiers. Pt returns to therapy after an absence due to her community care authorization.   Location: Patient: home Provider: home office   I discussed the limitations of evaluation and management by telemedicine and the availability of in person appointments. The patient expressed understanding and agreed to proceed.   History of Present Illness: Pt is referred to therapy for PTSD (MST) and MDD by the [facility]. Her biggest stressors are her health, taking care of her children (15 & 6) transitioning out of her 12 year relationship. Pt has fibromyalgia and other health issues, which affects her ability to take care of all her family's needs.  She has previous therapy.  Treatment Goals Addressed: Pt will recall the traumatic event without becoming too overwhelmed with emotions 50% of the time, evidenced by self report. Meet with clinician in person weekly for therapy to monitor for progress towards goals and address any barriers to success; Reduce depression from average severity level of 6/10 down to a 4/10 in next 6 months by engaging in 1-2 positive coping skills daily as part of developing self-care routine; Reduce average anxiety level from 7/10 down to 5/10 in next 6 months by utilizing 1-2 relaxation skills/grounding skills per day, such as mindful breathing, progressive muscle relaxation, positive visualizations.  Progression towards Goal: Progressing      Observations/Objective: Patient presented for todays session in pain from a fibromyalgia flareup on time and was alert, oriented x5, with no evidence or self-report of SI/HI or A/V H. Patient reported ongoing compliance with medication and denied any use of alcohol or illicit substances.  Clinician inquired about patients current  emotional ratings, as well as any significant changes in thoughts, feelings or behavior since previous session. Patient's emotional ratings: 6/10 for depression, 6/10 for anxiety, 1/10 for anger/irritability, recalling traumatic event without becoming too overwhelmed with emotions 40% of the time. All ratings are evidenced by self report. Cln explored trauma emotional ratings with pt who identified her trauma triggers and coping skills used. Pt provided updates on health, family, x relationship, job,, 3 children.. We discussed the impact these stressors have had on pt as well as how she coped with them. Cln and pt continue to work with the CBT workbook and again,reviewed, continuing with CBT activities and went over homework, pt asking appropriate questions.. Cln encouraged pt to continue working in the workbook.    [name]   Collaboration of Care: Other: Continue with providers at [facility]  Patient/Guardian was advised Release of Information must be obtained prior to any record release in order to collaborate their care with an outside provider.   Patient/Guardian was advised if they have not already done so to contact the registration department to sign all necessary forms in order for us  to release information regarding their care.   Consent: Patient/Guardian gives verbal consent for treatment and assignment of benefits for services provided during this visit. Patient/Guardian expressed understanding and agreed to proceed.   Assessment and Plan: Counselor will continue to meet with patient to address treatment plan goals. Patient will continue to follow recommendations of providers and implement skills learned in session and practice skills between sessions. Diagnosis: MDD and PTSD       Follow Up Instructions: I discussed the assessment and treatment plan with the patient. The patient was provided  an opportunity to ask questions and all were answered. The patient agreed with the plan and demonstrated an  understanding of the plan.   The patient was advised to call back or seek an in-person evaluation if the symptoms worsen or if the condition fails to improve as anticipated.   I provided 45 minutes of non-face-to-face time during this encounter.     Jordi Lacko S, LCAS

## 2024-08-08 ENCOUNTER — Encounter (HOSPITAL_COMMUNITY): Payer: Self-pay | Admitting: Licensed Clinical Social Worker

## 2024-08-08 ENCOUNTER — Ambulatory Visit (INDEPENDENT_AMBULATORY_CARE_PROVIDER_SITE_OTHER): Admitting: Licensed Clinical Social Worker

## 2024-08-08 DIAGNOSIS — F4310 Post-traumatic stress disorder, unspecified: Secondary | ICD-10-CM

## 2024-08-08 DIAGNOSIS — F32A Depression, unspecified: Secondary | ICD-10-CM

## 2024-08-08 DIAGNOSIS — F331 Major depressive disorder, recurrent, moderate: Secondary | ICD-10-CM

## 2024-08-08 NOTE — Progress Notes (Signed)
 Virtual GroupVisit via video Note   I connected with [name] on 12/22//25 at 5:00-6:30 pm during group session by video enabled telemedicine application and verified that I am speaking with the correct person using two identifiers.   Location: Patient: home Provider: home office   I discussed the limitations of evaluation and management by telemedicine and the availability of in person appointments. The patient expressed understanding and agreed to proceed.   History of Present Illness: Pt is referred to therapy for PTSD (MST) and MDD by the [facility]. Her biggest stressors are her health, taking care of her children (15 & 6) transitioning out of her 12 year relationship. Pt has fibromyalgia and other health issues, which affects her ability to take care of all her family's needs.  She has previous therapy.  Treatment Goals Addressed: Pt will recall the traumatic event without becoming too overwhelmed with emotions 50% of the time, evidenced by self report. Meet with clinician in person weekly for therapy to monitor for progress towards goals and address any barriers to success; Reduce depression from average severity level of 6/10 down to a 4/10 in next 90 days by engaging in 1-2 positive coping skills daily as part of developing self-care routine; Reduce average anxiety level from 7/10 down to 5/10 in next 90 days by utilizing 1-2 relaxation skills/grounding skills per day, such as mindful breathing, progressive muscle relaxation, positive visualizations.  Progression towards Goal: Progressing     Observations/Objective:     Patient participated in a discussion on dealing with family during the Christmas holidays.  Patient shared problems that may occur during this holiday. Patient was encouraged to use appropriate boundaries during this stressful time.     Collaboration of Care: Other[facility]    Patient/Guardian was advised Release of Information  must be obtained prior to any record release in order to collaborate their care with an outside provider.   Patient/Guardian was advised if they have not already done so to contact the registration department to sign all necessary forms in order for us  to release information regarding their care.   Consent: Patient/Guardian gives verbal consent for treatment and assignment of benefits for services provided during this visit. Patient/Guardian expressed understanding and agreed to proceed.    Assessment and Plan: Counselor will continue to meet with patient to address treatment plan goals. Patient will continue to follow recommendations of providers and implement skills learned in session and practice coping skills between  Sessions.Diagnosis: depression and ptsd     Follow Up Instructions: I discussed the assessment and treatment plan with the patient. The patient was provided an opportunity to ask questions and all were answered. The patient agreed with the plan and demonstrated an understanding of the plan.   The patient was advised to call back or seek an in-person evaluation if the symptoms worsen or if the condition fails to improve as anticipated.   I provided 90 minutes of non-face-to-face time during the group encounter.     Shia Eber S, LCAS

## 2024-08-09 ENCOUNTER — Ambulatory Visit (INDEPENDENT_AMBULATORY_CARE_PROVIDER_SITE_OTHER): Admitting: Licensed Clinical Social Worker

## 2024-08-09 DIAGNOSIS — F4310 Post-traumatic stress disorder, unspecified: Secondary | ICD-10-CM | POA: Diagnosis not present

## 2024-08-09 DIAGNOSIS — F329 Major depressive disorder, single episode, unspecified: Secondary | ICD-10-CM

## 2024-08-09 DIAGNOSIS — F331 Major depressive disorder, recurrent, moderate: Secondary | ICD-10-CM

## 2024-08-13 ENCOUNTER — Encounter (HOSPITAL_COMMUNITY): Payer: Self-pay | Admitting: Licensed Clinical Social Worker

## 2024-08-13 NOTE — Progress Notes (Signed)
 Virtual Visit via video Note   I connected with Susan Guerra on 08/09/2024 at 12:15-1pm by video enabled telemedicine application and verified that I am speaking with the correct person using two identifiers. Pt returns to therapy after an absence due to her community care authorization.   Location: Patient: home Provider: home office   I discussed the limitations of evaluation and management by telemedicine and the availability of in person appointments. The patient expressed understanding and agreed to proceed.   History of Present Illness: Pt is referred to therapy for PTSD (MST) and MDD by the [facility]. Her biggest stressors are her health, taking care of her children (15 & 6) transitioning out of her 12 year relationship. Pt has fibromyalgia and other health issues, which affects her ability to take care of all her family's needs.  She has previous therapy.  Treatment Goals Addressed: Pt will recall the traumatic event without becoming too overwhelmed with emotions 50% of the time, evidenced by self report. Meet with clinician in person weekly for therapy to monitor for progress towards goals and address any barriers to success; Reduce depression from average severity level of 6/10 down to a 4/10 in next 6 months by engaging in 1-2 positive coping skills daily as part of developing self-care routine; Reduce average anxiety level from 7/10 down to 5/10 in next 6 months by utilizing 1-2 relaxation skills/grounding skills per day, such as mindful breathing, progressive muscle relaxation, positive visualizations.  Progression towards Goal: Progressing      Observations/Objective: Patient presented for todays session in pain from a fibromyalgia flareup on time and was alert, oriented x5, with no evidence or self-report of SI/HI or A/V H. Patient reported ongoing compliance with medication and denied any use of alcohol or illicit substances.  Clinician inquired about patients current  emotional ratings, as well as any significant changes in thoughts, feelings or behavior since previous session. Patient's emotional ratings: 4/10 for depression,46/10 for anxiety, 1/10 for anger/irritability, recalling traumatic event without becoming too overwhelmed with emotions 40% of the time. All ratings are evidenced by self report. Cln explored trauma emotional ratings with pt who identified her trauma triggers and coping skills used. Pt provided updates on health, family, x relationship, job,, 3 children.. We discussed the impact these stressors have had on pt as well as how she coped with them. My anxiety and depressive symptoms have decreased due to my older sister visiting for 2 weeks. She is my substitute mom. I love having her here. Pt shared about childhood memories of family christmases, fond memories. Cln asked open ended questions about her own traditions with her own family. Briefly reviewed CBT workbook and will continue to work on it after holidays.   Susan Guerra   Collaboration of Care: Other: Continue with providers at [facility]  Patient/Guardian was advised Release of Information must be obtained prior to any record release in order to collaborate their care with an outside provider.   Patient/Guardian was advised if they have not already done so to contact the registration department to sign all necessary forms in order for us  to release information regarding their care.   Consent: Patient/Guardian gives verbal consent for treatment and assignment of benefits for services provided during this visit. Patient/Guardian expressed understanding and agreed to proceed.   Assessment and Plan: Counselor will continue to meet with patient to address treatment plan goals. Patient will continue to follow recommendations of providers and implement skills learned in session and practice skills between sessions.  Diagnosis: MDD and PTSD       Follow Up Instructions: I discussed the assessment and  treatment plan with the patient. The patient was provided an opportunity to ask questions and all were answered. The patient agreed with the plan and demonstrated an understanding of the plan.   The patient was advised to call back or seek an in-person evaluation if the symptoms worsen or if the condition fails to improve as anticipated.   I provided 45 minutes of non-face-to-face time during this encounter.     Tarrin Menn S, LCAS

## 2024-08-15 ENCOUNTER — Encounter (HOSPITAL_COMMUNITY): Payer: Self-pay

## 2024-08-15 ENCOUNTER — Ambulatory Visit (HOSPITAL_COMMUNITY): Admitting: Licensed Clinical Social Worker

## 2024-08-16 ENCOUNTER — Ambulatory Visit (HOSPITAL_COMMUNITY): Admitting: Licensed Clinical Social Worker

## 2024-08-16 ENCOUNTER — Encounter (HOSPITAL_COMMUNITY): Payer: Self-pay | Admitting: Licensed Clinical Social Worker

## 2024-08-16 DIAGNOSIS — F329 Major depressive disorder, single episode, unspecified: Secondary | ICD-10-CM | POA: Diagnosis not present

## 2024-08-16 DIAGNOSIS — F4310 Post-traumatic stress disorder, unspecified: Secondary | ICD-10-CM | POA: Diagnosis not present

## 2024-08-16 DIAGNOSIS — F331 Major depressive disorder, recurrent, moderate: Secondary | ICD-10-CM

## 2024-08-16 NOTE — Progress Notes (Signed)
 Virtual Visit via video Note   I connected with Susan Guerra on 08/16/2024 at 12:15-1pm by video enabled telemedicine application and verified that I am speaking with the correct person using two identifiers. Pt returns to therapy after an absence due to her community care authorization.   Location: Patient: home Provider: home office   I discussed the limitations of evaluation and management by telemedicine and the availability of in person appointments. The patient expressed understanding and agreed to proceed.   History of Present Illness: Pt is referred to therapy for PTSD (MST) and MDD by the [facility]. Her biggest stressors are her health, taking care of her children (15 & 6) transitioning out of her 12 year relationship. Pt has fibromyalgia and other health issues, which affects her ability to take care of all her family's needs.  She has previous therapy.  Treatment Goals Addressed: Pt will recall the traumatic event without becoming too overwhelmed with emotions 50% of the time, evidenced by self report. Meet with clinician in person weekly for therapy to monitor for progress towards goals and address any barriers to success; Reduce depression from average severity level of 6/10 down to a 4/10 in next 6 months by engaging in 1-2 positive coping skills daily as part of developing self-care routine; Reduce average anxiety level from 7/10 down to 5/10 in next 6 months by utilizing 1-2 relaxation skills/grounding skills per day, such as mindful breathing, progressive muscle relaxation, positive visualizations.  Progression towards Goal: Progressing      Observations/Objective: Patient presented for todays session in pain from a fibromyalgia flareup on time and was alert, oriented x5, with no evidence or self-report of SI/HI or A/V H. Patient reported ongoing compliance with medication and denied any use of alcohol or illicit substances.  Clinician inquired about patients current  emotional ratings, as well as any significant changes in thoughts, feelings or behavior since previous session. Patient's emotional ratings: 4/10 for depression,46/10 for anxiety, 1/10 for anger/irritability, recalling traumatic event without becoming too overwhelmed with emotions 30% of the time. All ratings are evidenced by self report. Cln explored trauma emotional ratings with pt who identified her trauma triggers and coping skills used. Pt provided updates on health, family, x relationship, job,, 3 children..Pt discussed her christmas holidays, spending the day in urgent care with oldest son sick. I was exhausted when I got home and someone else had to cook. I've been feeling more irritable lately and discussed it with my medication manager who tied it to me lack of sleep. We will discuss it at next appt.Again,briefly reviewed CBT workbook and will continue to work on it after holidays.   [name]   Collaboration of Care: Other: Continue with providers at [facility]  Patient/Guardian was advised Release of Information must be obtained prior to any record release in order to collaborate their care with an outside provider.   Patient/Guardian was advised if they have not already done so to contact the registration department to sign all necessary forms in order for us  to release information regarding their care.   Consent: Patient/Guardian gives verbal consent for treatment and assignment of benefits for services provided during this visit. Patient/Guardian expressed understanding and agreed to proceed.   Assessment and Plan: Counselor will continue to meet with patient to address treatment plan goals. Patient will continue to follow recommendations of providers and implement skills learned in session and practice skills between sessions. Diagnosis: MDD and PTSD       Follow Up Instructions:  I discussed the assessment and treatment plan with the patient. The patient was provided an opportunity to ask  questions and all were answered. The patient agreed with the plan and demonstrated an understanding of the plan.   The patient was advised to call back or seek an in-person evaluation if the symptoms worsen or if the condition fails to improve as anticipated.   I provided 45 minutes of non-face-to-face time during this encounter.     Elia Keenum S, LCAS

## 2024-08-22 ENCOUNTER — Encounter (HOSPITAL_COMMUNITY): Payer: Self-pay | Admitting: Licensed Clinical Social Worker

## 2024-08-22 ENCOUNTER — Ambulatory Visit (INDEPENDENT_AMBULATORY_CARE_PROVIDER_SITE_OTHER): Admitting: Licensed Clinical Social Worker

## 2024-08-22 DIAGNOSIS — F4310 Post-traumatic stress disorder, unspecified: Secondary | ICD-10-CM | POA: Diagnosis not present

## 2024-08-22 DIAGNOSIS — F32A Depression, unspecified: Secondary | ICD-10-CM | POA: Diagnosis not present

## 2024-08-22 DIAGNOSIS — F331 Major depressive disorder, recurrent, moderate: Secondary | ICD-10-CM

## 2024-08-22 NOTE — Progress Notes (Signed)
 Virtual GroupVisit via video Note   I connected with Susan Guerra on 08/22/24 at 5:00-6:30 pm during group session by video enabled telemedicine application and verified that I am speaking with the correct person using two identifiers.   Location: Patient: home Provider: home office   I discussed the limitations of evaluation and management by telemedicine and the availability of in person appointments. The patient expressed understanding and agreed to proceed.   History of Present Illness: Pt is referred to therapy for PTSD (MST) and MDD by the [facility]. Her biggest stressors are her health, taking care of her children (15 & 6) transitioning out of her 12 year relationship. Pt has fibromyalgia and other health issues, which affects her ability to take care of all her family's needs.  She has previous therapy.  Treatment Goals Addressed: Pt will recall the traumatic event without becoming too overwhelmed with emotions 50% of the time, evidenced by self report. Meet with clinician in person weekly for therapy to monitor for progress towards goals and address any barriers to success; Reduce depression from average severity level of 6/10 down to a 4/10 in next 90 days by engaging in 1-2 positive coping skills daily as part of developing self-care routine; Reduce average anxiety level from 7/10 down to 5/10 in next 90 days by utilizing 1-2 relaxation skills/grounding skills per day, such as mindful breathing, progressive muscle relaxation, positive visualizations.  Progression towards Goal: Progressing     Observations/Objective:      Patient participated in group therapy on making SMART goals for the new year of 2026. Clinician provided education on goal setting where SMART goals help focus efforts and increases the chances of achieving goals.  Patient was encouraged to identify personal goals for the new year of 2026.   Collaboration of Care: Other: [facility]    Patient/Guardian was advised Release of Information must be obtained prior to any record release in order to collaborate their care with an outside provider.   Patient/Guardian was advised if they have not already done so to contact the registration department to sign all necessary forms in order for us  to release information regarding their care.   Consent: Patient/Guardian gives verbal consent for treatment and assignment of benefits for services provided during this visit. Patient/Guardian expressed understanding and agreed to proceed.    Assessment and Plan: Counselor will continue to meet with patient to address treatment plan goals. Patient will continue to follow recommendations of providers and implement skills learned in session and practice coping skills between  Sessions.Diagnosis: depression and ptsd     Follow Up Instructions: I discussed the assessment and treatment plan with the patient. The patient was provided an opportunity to ask questions and all were answered. The patient agreed with the plan and demonstrated an understanding of the plan.   The patient was advised to call back or seek an in-person evaluation if the symptoms worsen or if the condition fails to improve as anticipated.   I provided 90 minutes of non-face-to-face time during the group encounter.     Daleiza Bacchi S, LCAS

## 2024-08-23 ENCOUNTER — Ambulatory Visit (INDEPENDENT_AMBULATORY_CARE_PROVIDER_SITE_OTHER): Admitting: Licensed Clinical Social Worker

## 2024-08-23 ENCOUNTER — Encounter (HOSPITAL_COMMUNITY): Payer: Self-pay | Admitting: Licensed Clinical Social Worker

## 2024-08-23 DIAGNOSIS — F331 Major depressive disorder, recurrent, moderate: Secondary | ICD-10-CM

## 2024-08-23 DIAGNOSIS — F329 Major depressive disorder, single episode, unspecified: Secondary | ICD-10-CM

## 2024-08-23 DIAGNOSIS — F4310 Post-traumatic stress disorder, unspecified: Secondary | ICD-10-CM

## 2024-08-23 NOTE — Progress Notes (Signed)
 Virtual Visit via video Note   I connected with Susan Guerra on 08/24/2023 at 12:15-1pm by video enabled telemedicine application and verified that I am speaking with the correct person using two identifiers. Pt returns to therapy after an absence due to her community care authorization.   Location: Patient: home Provider: home office   I discussed the limitations of evaluation and management by telemedicine and the availability of in person appointments. The patient expressed understanding and agreed to proceed.   History of Present Illness: Pt is referred to therapy for PTSD (MST) and MDD by the [facility]. Her biggest stressors are her health, taking care of her children (15 & 6) transitioning out of her 12 year relationship. Pt has fibromyalgia and other health issues, which affects her ability to take care of all her family's needs.  She has previous therapy.  Treatment Goals Addressed: Pt will recall the traumatic event without becoming too overwhelmed with emotions 50% of the time, evidenced by self report. Meet with clinician in person weekly for therapy to monitor for progress towards goals and address any barriers to success; Reduce depression from average severity level of 6/10 down to a 4/10 in next 6 months by engaging in 1-2 positive coping skills daily as part of developing self-care routine; Reduce average anxiety level from 7/10 down to 5/10 in next 6 months by utilizing 1-2 relaxation skills/grounding skills per day, such as mindful breathing, progressive muscle relaxation, positive visualizations.  Progression towards Goal: Progressing      Observations/Objective: Patient presented for todays session in pain from a fibromyalgia flareup on time and was alert, oriented x5, with no evidence or self-report of SI/HI or A/V H. Patient reported ongoing compliance with medication and denied any use of alcohol or illicit substances.  Clinician inquired about patients current  emotional ratings, as well as any significant changes in thoughts, feelings or behavior since previous session. Patient's emotional ratings: 4/10 for depression,6/10 for anxiety, 1/10 for anger/irritability, recalling traumatic event without becoming too overwhelmed with emotions 40% of the time. All ratings are evidenced by self report. Cln explored trauma emotional ratings with pt who identified her trauma triggers and coping skills used. Pt provided updates on health, family, x relationship, job,, 3 children. I continue feeling irritable but not as much now that the children are back in school and I had a bread with my sister here for 2 weeks to help me. Pt talked about her relationship with her x, communication errors, co-dependency behaviors in the relationship. Cln gave pt information about book: Co-dependency no more.   Plan: CBT worbook   Collaboration of Care: Other: Continue with providers at [facility]  Patient/Guardian was advised Release of Information must be obtained prior to any record release in order to collaborate their care with an outside provider.   Patient/Guardian was advised if they have not already done so to contact the registration department to sign all necessary forms in order for us  to release information regarding their care.   Consent: Patient/Guardian gives verbal consent for treatment and assignment of benefits for services provided during this visit. Patient/Guardian expressed understanding and agreed to proceed.   Assessment and Plan: Counselor will continue to meet with patient to address treatment plan goals. Patient will continue to follow recommendations of providers and implement skills learned in session and practice skills between sessions. Diagnosis: MDD and PTSD       Follow Up Instructions: I discussed the assessment and treatment plan with the patient.  The patient was provided an opportunity to ask questions and all were answered. The patient agreed with  the plan and demonstrated an understanding of the plan.   The patient was advised to call back or seek an in-person evaluation if the symptoms worsen or if the condition fails to improve as anticipated.   I provided 45 minutes of non-face-to-face time during this encounter.     Jenniger Figiel S, LCAS

## 2024-08-29 ENCOUNTER — Encounter (HOSPITAL_COMMUNITY): Payer: Self-pay | Admitting: Licensed Clinical Social Worker

## 2024-08-29 ENCOUNTER — Ambulatory Visit (INDEPENDENT_AMBULATORY_CARE_PROVIDER_SITE_OTHER): Admitting: Licensed Clinical Social Worker

## 2024-08-29 DIAGNOSIS — F4310 Post-traumatic stress disorder, unspecified: Secondary | ICD-10-CM

## 2024-08-29 DIAGNOSIS — F32A Depression, unspecified: Secondary | ICD-10-CM | POA: Diagnosis not present

## 2024-08-29 DIAGNOSIS — F331 Major depressive disorder, recurrent, moderate: Secondary | ICD-10-CM

## 2024-08-29 NOTE — Progress Notes (Signed)
 Virtual GroupVisit via video Note   I connected with Susan Guerra on 08/29/24 at 5:00-6:30 pm during group session by video enabled telemedicine application and verified that I am speaking with the correct person using two identifiers.   Location: Patient: home Provider: home office   I discussed the limitations of evaluation and management by telemedicine and the availability of in person appointments. The patient expressed understanding and agreed to proceed.   History of Present Illness: Pt is referred to therapy for PTSD (MST) and MDD by the [facility]. Her biggest stressors are her health, taking care of her children (15 & 6) transitioning out of her 12 year relationship. Pt has fibromyalgia and other health issues, which affects her ability to take care of all her family's needs.  She has previous therapy.  Treatment Goals Addressed: Pt will recall the traumatic event without becoming too overwhelmed with emotions 50% of the time, evidenced by self report. Meet with clinician in person weekly for therapy to monitor for progress towards goals and address any barriers to success; Reduce depression from average severity level of 6/10 down to a 4/10 in next 90 days by engaging in 1-2 positive coping skills daily as part of developing self-care routine; Reduce average anxiety level from 7/10 down to 5/10 in next 90 days by utilizing 1-2 relaxation skills/grounding skills per day, such as mindful breathing, progressive muscle relaxation, positive visualizations.  Progression towards Goal: Progressing     Observations/Objective:      Patient participated in a discussion on overcoming life's challenges, especially with family, under stress, which makes it more difficult to overcome obstacles in life. Patient was encouraged to identify life challenges and explore strategies for overcoming obstacles in life.      Coll[facility]ation of Care: Other[facility]    Patient/Guardian was advised Release of Information must be obtained prior to any record release in order to collaborate their care with an outside provider.   Patient/Guardian was advised if they have not already done so to contact the registration department to sign all necessary forms in order for us  to release information regarding their care.   Consent: Patient/Guardian gives verbal consent for treatment and assignment of benefits for services provided during this visit. Patient/Guardian expressed understanding and agreed to proceed.    Assessment and Plan: Counselor will continue to meet with patient to address treatment plan goals. Patient will continue to follow recommendations of providers and implement skills learned in session and practice coping skills between  Sessions.Diagnosis: depression and ptsd     Follow Up Instructions: I discussed the assessment and treatment plan with the patient. The patient was provided an opportunity to ask questions and all were answered. The patient agreed with the plan and demonstrated an understanding of the plan.   The patient was advised to call back or seek an in-person evaluation if the symptoms worsen or if the condition fails to improve as anticipated.   I provided 90 minutes of non-face-to-face time during the group encounter.     Tishawn Friedhoff S, LCAS

## 2024-08-30 ENCOUNTER — Ambulatory Visit (INDEPENDENT_AMBULATORY_CARE_PROVIDER_SITE_OTHER): Admitting: Licensed Clinical Social Worker

## 2024-08-30 ENCOUNTER — Ambulatory Visit (HOSPITAL_COMMUNITY): Admitting: Licensed Clinical Social Worker

## 2024-08-30 DIAGNOSIS — F331 Major depressive disorder, recurrent, moderate: Secondary | ICD-10-CM | POA: Diagnosis not present

## 2024-08-30 DIAGNOSIS — F4310 Post-traumatic stress disorder, unspecified: Secondary | ICD-10-CM | POA: Diagnosis not present

## 2024-09-05 ENCOUNTER — Ambulatory Visit (HOSPITAL_COMMUNITY): Admitting: Licensed Clinical Social Worker

## 2024-09-05 DIAGNOSIS — F331 Major depressive disorder, recurrent, moderate: Secondary | ICD-10-CM

## 2024-09-05 DIAGNOSIS — F4310 Post-traumatic stress disorder, unspecified: Secondary | ICD-10-CM

## 2024-09-06 ENCOUNTER — Ambulatory Visit (HOSPITAL_COMMUNITY): Admitting: Licensed Clinical Social Worker

## 2024-09-06 ENCOUNTER — Ambulatory Visit (INDEPENDENT_AMBULATORY_CARE_PROVIDER_SITE_OTHER): Admitting: Licensed Clinical Social Worker

## 2024-09-06 DIAGNOSIS — F4310 Post-traumatic stress disorder, unspecified: Secondary | ICD-10-CM | POA: Diagnosis not present

## 2024-09-06 DIAGNOSIS — F331 Major depressive disorder, recurrent, moderate: Secondary | ICD-10-CM

## 2024-09-07 ENCOUNTER — Encounter (HOSPITAL_COMMUNITY): Payer: Self-pay | Admitting: Licensed Clinical Social Worker

## 2024-09-07 NOTE — Progress Notes (Signed)
 Virtual GroupVisit via video Note   I connected with [name] on 08/29/24 at 5:00-6:30 pm during group session by video enabled telemedicine application and verified that I am speaking with the correct person using two identifiers.   Location: Patient: home Provider: home office   I discussed the limitations of evaluation and management by telemedicine and the availability of in person appointments. The patient expressed understanding and agreed to proceed.   History of Present Illness: Pt is referred to therapy for PTSD (MST) and MDD by the [facility]. Her biggest stressors are her health, taking care of her children (15 & 6) transitioning out of her 12 year relationship. Pt has fibromyalgia and other health issues, which affects her ability to take care of all her family's needs.  She has previous therapy.  Treatment Goals Addressed: Pt will recall the traumatic event without becoming too overwhelmed with emotions 50% of the time, evidenced by self report. Meet with clinician in person weekly for therapy to monitor for progress towards goals and address any barriers to success; Reduce depression from average severity level of 6/10 down to a 4/10 in next 90 days by engaging in 1-2 positive coping skills daily as part of developing self-care routine; Reduce average anxiety level from 7/10 down to 5/10 in next 90 days by utilizing 1-2 relaxation skills/grounding skills per day, such as mindful breathing, progressive muscle relaxation, positive visualizations.  Progression towards Goal: Progressing     Observations/Objective:       Patient participated in a group discussion on Gladis Vonn Novak birthday celebration today, including the tenets of his speeches and effects of trauma in his lifetime.  Pt participated in discussion on trauma.   Collaboration of Care: Other: [facility]    Patient/Guardian was advised Release of Information must be obtained prior to  any record release in order to collaborate their care with an outside provider.   Patient/Guardian was advised if they have not already done so to contact the registration department to sign all necessary forms in order for us  to release information regarding their care.   Consent: Patient/Guardian gives verbal consent for treatment and assignment of benefits for services provided during this visit. Patient/Guardian expressed understanding and agreed to proceed.    Assessment and Plan: Counselor will continue to meet with patient to address treatment plan goals. Patient will continue to follow recommendations of providers and implement skills learned in session and practice coping skills between  Sessions.Diagnosis: depression and ptsd     Follow Up Instructions: I discussed the assessment and treatment plan with the patient. The patient was provided an opportunity to ask questions and all were answered. The patient agreed with the plan and demonstrated an understanding of the plan.   The patient was advised to call back or seek an in-person evaluation if the symptoms worsen or if the condition fails to improve as anticipated.   I provided 90 minutes of non-face-to-face time during the group encounter.     Noura Purpura S, LCAS

## 2024-09-12 ENCOUNTER — Ambulatory Visit (HOSPITAL_COMMUNITY): Admitting: Licensed Clinical Social Worker

## 2024-09-13 ENCOUNTER — Ambulatory Visit (INDEPENDENT_AMBULATORY_CARE_PROVIDER_SITE_OTHER): Admitting: Licensed Clinical Social Worker

## 2024-09-13 ENCOUNTER — Encounter (HOSPITAL_COMMUNITY): Payer: Self-pay | Admitting: Licensed Clinical Social Worker

## 2024-09-13 DIAGNOSIS — F4310 Post-traumatic stress disorder, unspecified: Secondary | ICD-10-CM | POA: Diagnosis not present

## 2024-09-13 DIAGNOSIS — F331 Major depressive disorder, recurrent, moderate: Secondary | ICD-10-CM | POA: Diagnosis not present

## 2024-09-13 NOTE — Progress Notes (Signed)
 Virtual Visit via video Note   I connected with Susan Guerra on 08/31/2023 at 3- 4 pm by video enabled telemedicine application and verified that I am speaking with the correct person using two identifiers. Pt returns to therapy after an absence due to her community care authorization.   Location: Patient: home Provider: home office   I discussed the limitations of evaluation and management by telemedicine and the availability of in person appointments. The patient expressed understanding and agreed to proceed.   History of Present Illness: Pt is referred to therapy for PTSD (MST) and MDD by the [facility]. Her biggest stressors are her health, taking care of her children (15 & 6) transitioning out of her 12 year relationship. Pt has fibromyalgia and other health issues, which affects her ability to take care of all her family's needs.  She has previous therapy.  Treatment Goals Addressed: Pt will recall the traumatic event without becoming too overwhelmed with emotions 50% of the time, evidenced by self report. Meet with clinician in person weekly for therapy to monitor for progress towards goals and address any barriers to success; Reduce depression from average severity level of 6/10 down to a 4/10 in next 6 months by engaging in 1-2 positive coping skills daily as part of developing self-care routine; Reduce average anxiety level from 7/10 down to 5/10 in next 6 months by utilizing 1-2 relaxation skills/grounding skills per day, such as mindful breathing, progressive muscle relaxation, positive visualizations.  Progression towards Goal: Progressing      Observations/Objective: Patient presented for todays session in pain from a fibromyalgia flareup on time and was alert, oriented x5, with no evidence or self-report of SI/HI or A/V H. Patient reported ongoing compliance with medication and denied any use of alcohol or illicit substances.  Clinician inquired about patients current  emotional ratings, as well as any significant changes in thoughts, feelings or behavior since previous session. Patient's emotional ratings: 5/10 for depression,6/10 for anxiety, 1/10 for anger/irritability, recalling traumatic event without becoming too overwhelmed with emotions 40% of the time. All ratings are evidenced by self report. Cln explored trauma emotional ratings with pt who identified her trauma triggers and coping skills used. Pt provided updates on health, family, x relationship, job,, 3 children. I'm thinking about moving to [place] with my siblings. Cln asked open ended questions discussing plans for move. Cln provided psychoeducation on co-dependency.     Co-dependency no more.   Plan: CBT worbook   Collaboration of Care: Other: Continue with providers at [facility]  Patient/Guardian was advised Release of Information must be obtained prior to any record release in order to collaborate their care with an outside provider.   Patient/Guardian was advised if they have not already done so to contact the registration department to sign all necessary forms in order for us  to release information regarding their care.   Consent: Patient/Guardian gives verbal consent for treatment and assignment of benefits for services provided during this visit. Patient/Guardian expressed understanding and agreed to proceed.   Assessment and Plan: Counselor will continue to meet with patient to address treatment plan goals. Patient will continue to follow recommendations of providers and implement skills learned in session and practice skills between sessions. Diagnosis: MDD and PTSD       Follow Up Instructions: I discussed the assessment and treatment plan with the patient. The patient was provided an opportunity to ask questions and all were answered. The patient agreed with the plan and demonstrated an understanding  of the plan.   The patient was advised to call back or seek an in-person  evaluation if the symptoms worsen or if the condition fails to improve as anticipated.   I provided 45 minutes of non-face-to-face time during this encounter.     Ashira Kirsten S, LCAS

## 2024-09-14 ENCOUNTER — Encounter (HOSPITAL_COMMUNITY): Payer: Self-pay | Admitting: Licensed Clinical Social Worker

## 2024-09-15 NOTE — Progress Notes (Signed)
 Virtual Visit via video Note   I connected with Susan Guerra on 09/06/2024 at 2-3 pm by video enabled telemedicine application and verified that I am speaking with the correct person using two identifiers. Pt returns to therapy after an absence due to her community care authorization.   Location: Patient: home Provider: home office   I discussed the limitations of evaluation and management by telemedicine and the availability of in person appointments. The patient expressed understanding and agreed to proceed.   History of Present Illness: Pt is referred to therapy for PTSD (MST) and MDD by the [facility]. Her biggest stressors are her health, taking care of her children (15 & 6) transitioning out of her 12 year relationship. Pt has fibromyalgia and other health issues, which affects her ability to take care of all her family'Guerra needs.  She has previous therapy.  Treatment Goals Addressed: Pt will recall the traumatic event without becoming too overwhelmed with emotions 50% of the time, evidenced by self report. Meet with clinician in person weekly for therapy to monitor for progress towards goals and address any barriers to success; Reduce depression from average severity level of 6/10 down to a 4/10 in next 6 months by engaging in 1-2 positive coping skills daily as part of developing self-care routine; Reduce average anxiety level from 7/10 down to 5/10 in next 6 months by utilizing 1-2 relaxation skills/grounding skills per day, such as mindful breathing, progressive muscle relaxation, positive visualizations.  Progression towards Goal: Progressing      Observations/Objective: Patient presented for todays session in pain from a fibromyalgia flareup on time and was alert, oriented x5, with no evidence or self-report of SI/HI or A/V H. Patient reported ongoing compliance with medication and denied any use of alcohol or illicit substances.  Clinician inquired about patients current  emotional ratings, as well as any significant changes in thoughts, feelings or behavior since previous session. Patient'Guerra emotional ratings: 5/10 for depression,6/10 for anxiety, 3/10 for anger/irritability, recalling traumatic event without becoming too overwhelmed with emotions 40% of the time. All ratings are evidenced by self report. Cln explored trauma emotional ratings with pt who identified her trauma triggers and coping skills used. Pt provided updates on health, family, x relationship, job,, 3 children. I'm still thinking about moving to [place] with my siblings.Its a lot of moving parts trying to put the move together. Cln asked open ended questions. Cln ant pt continued discussion about Co-dependency no more.    Co-dependency no more.   Plan: CBT worbook   Collaboration of Care: Other: Continue with providers at [facility]  Patient/Guardian was advised Release of Information must be obtained prior to any record release in order to collaborate their care with an outside provider.   Patient/Guardian was advised if they have not already done so to contact the registration department to sign all necessary forms in order for us  to release information regarding their care.   Consent: Patient/Guardian gives verbal consent for treatment and assignment of benefits for services provided during this visit. Patient/Guardian expressed understanding and agreed to proceed.   Assessment and Plan: Counselor will continue to meet with patient to address treatment plan goals. Patient will continue to follow recommendations of providers and implement skills learned in session and practice skills between sessions. Diagnosis: MDD and PTSD       Follow Up Instructions: I discussed the assessment and treatment plan with the patient. The patient was provided an opportunity to ask questions and all were answered.  The patient agreed with the plan and demonstrated an understanding of the plan.   The  patient was advised to call back or seek an in-person evaluation if the symptoms worsen or if the condition fails to improve as anticipated.   I provided 45 minutes of non-face-to-face time during this encounter.     Susan Guerra, LCAS

## 2024-09-16 ENCOUNTER — Encounter (HOSPITAL_COMMUNITY): Payer: Self-pay | Admitting: Licensed Clinical Social Worker

## 2024-09-16 NOTE — Progress Notes (Signed)
 Virtual Visit via video Note   I connected with Susan Guerra on 09/13/2024 at 12-1 pm by video enabled telemedicine application and verified that I am speaking with the correct person using two identifiers. Pt returns to therapy after an absence due to her community care authorization.   Location: Patient: home Provider: home office   I discussed the limitations of evaluation and management by telemedicine and the availability of in person appointments. The patient expressed understanding and agreed to proceed.   History of Present Illness: Pt is referred to therapy for PTSD (MST) and MDD by the [facility]. Her biggest stressors are her health, taking care of her children (15 & 6) transitioning out of her 12 year relationship. Pt has fibromyalgia and other health issues, which affects her ability to take care of all her family's needs.  She has previous therapy.  Treatment Goals Addressed: Pt will recall the traumatic event without becoming too overwhelmed with emotions 50% of the time, evidenced by self report. Meet with clinician in person weekly for therapy to monitor for progress towards goals and address any barriers to success; Reduce depression from average severity level of 6/10 down to a 4/10 in next 6 months by engaging in 1-2 positive coping skills daily as part of developing self-care routine; Reduce average anxiety level from 7/10 down to 5/10 in next 6 months by utilizing 1-2 relaxation skills/grounding skills per day, such as mindful breathing, progressive muscle relaxation, positive visualizations.  Progression towards Goal: Progressing      Observations/Objective: Patient presented for todays session ion time and was alert, oriented x5, with no evidence or self-report of SI/HI or A/V H. Patient reported ongoing compliance with medication and denied any use of alcohol or illicit substances.  Clinician inquired about patients current emotional ratings, as well as any  significant changes in thoughts, feelings or behavior since previous session. Patient's emotional ratings: 5/10 for depression,6/10 for anxiety, 3/10 for anger/irritability, recalling traumatic event without becoming too overwhelmed with emotions 30% of the time. All ratings are evidenced by self report. Cln explored trauma emotional ratings with pt who identified her trauma triggers and coping skills used. Pt provided updates on health, family, x relationship, job,, 3 children. I'm still thinking about moving to [place] with my siblings. I have a co-dependency relationship with the father of my youngest son and feel my life would be less stressful, if I moved away.. Cln asked open ended questions. Cln ant pt continued discussion about Co-dependency no more. Strategies.    Co-dependency no more.   Plan: CBT worbook   Collaboration of Care: Other: Continue with providers at [facility]  Patient/Guardian was advised Release of Information must be obtained prior to any record release in order to collaborate their care with an outside provider.   Patient/Guardian was advised if they have not already done so to contact the registration department to sign all necessary forms in order for us  to release information regarding their care.   Consent: Patient/Guardian gives verbal consent for treatment and assignment of benefits for services provided during this visit. Patient/Guardian expressed understanding and agreed to proceed.   Assessment and Plan: Counselor will continue to meet with patient to address treatment plan goals. Patient will continue to follow recommendations of providers and implement skills learned in session and practice skills between sessions. Diagnosis: MDD and PTSD       Follow Up Instructions: I discussed the assessment and treatment plan with the patient. The patient was provided an  opportunity to ask questions and all were answered. The patient agreed with the plan and  demonstrated an understanding of the plan.   The patient was advised to call back or seek an in-person evaluation if the symptoms worsen or if the condition fails to improve as anticipated.   I provided 60 minutes of non-face-to-face time during this encounter.     Lue Dubuque S, LCAS

## 2024-09-19 ENCOUNTER — Encounter (HOSPITAL_COMMUNITY): Payer: Self-pay | Admitting: Licensed Clinical Social Worker

## 2024-09-19 ENCOUNTER — Ambulatory Visit (INDEPENDENT_AMBULATORY_CARE_PROVIDER_SITE_OTHER): Admitting: Licensed Clinical Social Worker

## 2024-09-19 DIAGNOSIS — F329 Major depressive disorder, single episode, unspecified: Secondary | ICD-10-CM

## 2024-09-19 DIAGNOSIS — F4310 Post-traumatic stress disorder, unspecified: Secondary | ICD-10-CM | POA: Diagnosis not present

## 2024-09-19 DIAGNOSIS — F331 Major depressive disorder, recurrent, moderate: Secondary | ICD-10-CM

## 2024-09-19 NOTE — Progress Notes (Signed)
 Virtual GroupVisit via video Note   I connected with Susan Guerra on 09/19/24 at 5:00-6:30 pm during group session by video enabled telemedicine application and verified that I am speaking with the correct person using two identifiers.   Location: Patient: home Provider: home office   I discussed the limitations of evaluation and management by telemedicine and the availability of in person appointments. The patient expressed understanding and agreed to proceed.   History of Present Illness: Pt is referred to therapy for PTSD (MST) and MDD by the [facility]. Her biggest stressors are her health, taking care of her children (15 & 6) transitioning out of her 12 year relationship. Pt has fibromyalgia and other health issues, which affects her ability to take care of all her family's needs.  She has previous therapy.  Treatment Goals Addressed: Pt will recall the traumatic event without becoming too overwhelmed with emotions 50% of the time, evidenced by self report. Meet with clinician in person weekly for therapy to monitor for progress towards goals and address any barriers to success; Reduce depression from average severity level of 6/10 down to a 4/10 in next 90 days by engaging in 1-2 positive coping skills daily as part of developing self-care routine; Reduce average anxiety level from 7/10 down to 5/10 in next 90 days by utilizing 1-2 relaxation skills/grounding skills per day, such as mindful breathing, progressive muscle relaxation, positive visualizations.  Progression towards Goal: Progressing     Observations/Objective:     Pt participated in a group discussion about weather affecting mood, 2 weeks of snowstorms, being snowed in the house, limited communication, feeling closed off from family and friends. Cln encouraged pt to go out once the temperature warms up and encouraged pt to use mindfulness skills.     Collaboration of Care: Other: [facility]    Patient/Guardian was advised Release of Information must be obtained prior to any record release in order to collaborate their care with an outside provider.   Patient/Guardian was advised if they have not already done so to contact the registration department to sign all necessary forms in order for us  to release information regarding their care.   Consent: Patient/Guardian gives verbal consent for treatment and assignment of benefits for services provided during this visit. Patient/Guardian expressed understanding and agreed to proceed.    Assessment and Plan: Counselor will continue to meet with patient to address treatment plan goals. Patient will continue to follow recommendations of providers and implement skills learned in session and practice coping skills between  Sessions.Diagnosis: depression and ptsd     Follow Up Instructions: I discussed the assessment and treatment plan with the patient. The patient was provided an opportunity to ask questions and all were answered. The patient agreed with the plan and demonstrated an understanding of the plan.   The patient was advised to call back or seek an in-person evaluation if the symptoms worsen or if the condition fails to improve as anticipated.   I provided 90 minutes of non-face-to-face time during the group encounter.     Laval Cafaro S, LCAS

## 2024-09-20 ENCOUNTER — Encounter (HOSPITAL_COMMUNITY): Payer: Self-pay | Admitting: Licensed Clinical Social Worker

## 2024-09-20 ENCOUNTER — Ambulatory Visit (INDEPENDENT_AMBULATORY_CARE_PROVIDER_SITE_OTHER): Admitting: Licensed Clinical Social Worker

## 2024-09-20 DIAGNOSIS — F331 Major depressive disorder, recurrent, moderate: Secondary | ICD-10-CM | POA: Diagnosis not present

## 2024-09-20 DIAGNOSIS — F4310 Post-traumatic stress disorder, unspecified: Secondary | ICD-10-CM

## 2024-09-26 ENCOUNTER — Ambulatory Visit (HOSPITAL_COMMUNITY): Admitting: Licensed Clinical Social Worker

## 2024-09-27 ENCOUNTER — Ambulatory Visit (HOSPITAL_COMMUNITY): Admitting: Licensed Clinical Social Worker

## 2024-10-03 ENCOUNTER — Ambulatory Visit (HOSPITAL_COMMUNITY): Admitting: Licensed Clinical Social Worker

## 2024-10-04 ENCOUNTER — Ambulatory Visit (HOSPITAL_COMMUNITY): Admitting: Licensed Clinical Social Worker

## 2024-10-10 ENCOUNTER — Ambulatory Visit (HOSPITAL_COMMUNITY): Admitting: Licensed Clinical Social Worker

## 2024-10-11 ENCOUNTER — Ambulatory Visit (HOSPITAL_COMMUNITY): Admitting: Licensed Clinical Social Worker
